# Patient Record
Sex: Female | Born: 1950 | Race: White | Hispanic: No | Smoking: Current some day smoker
Health system: Southern US, Community
[De-identification: ages and names within clinical notes are randomized; demographics above are authoritative.]

## PROBLEM LIST (undated history)

## (undated) DIAGNOSIS — D509 Iron deficiency anemia, unspecified: Secondary | ICD-10-CM

## (undated) DIAGNOSIS — K589 Irritable bowel syndrome without diarrhea: Secondary | ICD-10-CM

## (undated) DIAGNOSIS — J45909 Unspecified asthma, uncomplicated: Secondary | ICD-10-CM

## (undated) DIAGNOSIS — I10 Essential (primary) hypertension: Secondary | ICD-10-CM

## (undated) DIAGNOSIS — J449 Chronic obstructive pulmonary disease, unspecified: Secondary | ICD-10-CM

## (undated) DIAGNOSIS — C801 Malignant (primary) neoplasm, unspecified: Secondary | ICD-10-CM

## (undated) DIAGNOSIS — F419 Anxiety disorder, unspecified: Secondary | ICD-10-CM

## (undated) HISTORY — PX: ABDOMINAL HYSTERECTOMY: SHX81

## (undated) HISTORY — PX: HERNIA REPAIR: SHX51

## (undated) HISTORY — DX: Iron deficiency anemia, unspecified: D50.9

## (undated) HISTORY — DX: Irritable bowel syndrome, unspecified: K58.9

## (undated) HISTORY — PX: TONSILLECTOMY: SUR1361

## (undated) HISTORY — PX: BREAST SURGERY: SHX581

---

## 2001-06-20 ENCOUNTER — Other Ambulatory Visit: Admission: RE | Admit: 2001-06-20 | Discharge: 2001-06-20 | Payer: Self-pay | Admitting: Dermatology

## 2002-08-06 ENCOUNTER — Ambulatory Visit (HOSPITAL_COMMUNITY): Admission: RE | Admit: 2002-08-06 | Discharge: 2002-08-06 | Payer: Self-pay | Admitting: Internal Medicine

## 2004-07-13 ENCOUNTER — Ambulatory Visit: Payer: Self-pay | Admitting: Internal Medicine

## 2004-07-15 ENCOUNTER — Ambulatory Visit (HOSPITAL_COMMUNITY): Admission: RE | Admit: 2004-07-15 | Discharge: 2004-07-15 | Payer: Self-pay | Admitting: Internal Medicine

## 2004-07-15 ENCOUNTER — Ambulatory Visit: Payer: Self-pay | Admitting: Internal Medicine

## 2004-07-21 ENCOUNTER — Ambulatory Visit (HOSPITAL_COMMUNITY): Admission: RE | Admit: 2004-07-21 | Discharge: 2004-07-21 | Payer: Self-pay | Admitting: Internal Medicine

## 2004-07-26 ENCOUNTER — Encounter (HOSPITAL_COMMUNITY): Admission: RE | Admit: 2004-07-26 | Discharge: 2004-08-25 | Payer: Self-pay | Admitting: Internal Medicine

## 2004-08-23 ENCOUNTER — Ambulatory Visit: Payer: Self-pay | Admitting: Internal Medicine

## 2004-12-05 ENCOUNTER — Other Ambulatory Visit: Admission: RE | Admit: 2004-12-05 | Discharge: 2004-12-05 | Payer: Self-pay | Admitting: Dermatology

## 2005-01-05 ENCOUNTER — Other Ambulatory Visit: Admission: RE | Admit: 2005-01-05 | Discharge: 2005-01-05 | Payer: Self-pay | Admitting: Dermatology

## 2005-12-13 ENCOUNTER — Ambulatory Visit (HOSPITAL_COMMUNITY): Admission: RE | Admit: 2005-12-13 | Discharge: 2005-12-13 | Payer: Self-pay | Admitting: *Deleted

## 2005-12-25 ENCOUNTER — Ambulatory Visit: Payer: Self-pay | Admitting: Internal Medicine

## 2006-01-01 ENCOUNTER — Encounter (INDEPENDENT_AMBULATORY_CARE_PROVIDER_SITE_OTHER): Payer: Self-pay | Admitting: Specialist

## 2006-01-01 ENCOUNTER — Ambulatory Visit (HOSPITAL_COMMUNITY): Admission: RE | Admit: 2006-01-01 | Discharge: 2006-01-01 | Payer: Self-pay | Admitting: Internal Medicine

## 2006-01-01 ENCOUNTER — Ambulatory Visit: Payer: Self-pay | Admitting: Internal Medicine

## 2006-01-03 ENCOUNTER — Ambulatory Visit (HOSPITAL_COMMUNITY): Admission: RE | Admit: 2006-01-03 | Discharge: 2006-01-03 | Payer: Self-pay | Admitting: Internal Medicine

## 2006-02-14 ENCOUNTER — Ambulatory Visit: Payer: Self-pay | Admitting: Internal Medicine

## 2006-02-27 HISTORY — PX: HERNIA REPAIR: SHX51

## 2007-01-23 ENCOUNTER — Ambulatory Visit (HOSPITAL_COMMUNITY): Admission: RE | Admit: 2007-01-23 | Discharge: 2007-01-23 | Payer: Self-pay | Admitting: General Surgery

## 2010-07-12 NOTE — H&P (Signed)
[name], [name]             ACCOUNT NO.:  [id number]   MEDICAL RECORD NO.:  [id number]          PATIENT TYPE:  AMB   LOCATION:  DAY                           FACILITY:  [facility]   PHYSICIAN:  Dalia Heading, M.D.  DATE OF BIRTH:  July 28, 1950   DATE OF ADMISSION:  DATE OF DISCHARGE:  LH                              HISTORY & PHYSICAL   CHIEF COMPLAINT:  Abdominal pain, incisional hernia.   HISTORY OF PRESENT ILLNESS:  The patient is a 60 year old white female  who was referred for evaluation and treatment of abdominal pain of  unknown etiology.  It has been present for many years.  No etiology has  been found despite extensive workup.  No fevers, chills, nausea, or  vomiting have been noted.  Occasional bowel urgency is noted.  A  colonoscopy done in the recent past revealed a tortuous sigmoid colon  but was, otherwise, unremarkable.   PAST MEDICAL HISTORY INCLUDES:  Intrinsic allergy/asthma.   PAST SURGICAL HISTORY:  1. Laparoscopy.  2. Hysterectomy.   CURRENT MEDICATIONS:  Prednisone Dosepak which she just finished today,  hyoscyamine, alprazolam, Nexium, albuterol p.r.n.   ALLERGIES:  No known drug allergies.   REVIEW OF SYSTEMS:  The patient does smoke tobacco daily.  She denies  any significant alcohol use.  She denies any other cardiopulmonary  difficulties or bleeding disorders.   PHYSICAL EXAMINATION:  The patient is a well-developed, well-nourished,  white female in no acute distress.  LUNGS:  Clear to auscultation with equal breath sounds bilaterally.  HEART EXAMINATION:  Reveals a regular rate and rhythm without S3, S4,  murmurs.  ABDOMEN:  Is soft and nondistended.  She is tender in the umbilicus with  a reducible hernia present.  A surgical scar is present in the  umbilicus.  This reproduces the pain on palpation.  No  hepatosplenomegaly or masses are noted.   IMPRESSION:  Incisional hernia.  I think that she is getting omentum  stuck in this umbilical  hernia which is causing her nonspecific  abdominal pain.   PLAN:  The patient is scheduled for an incisional herniorrhaphy on  01/23/2007.  The risks and benefits of the procedure including bleeding,  infection, and the recurrence of pain were fully explained to the  patient, gave informed consent.  Should this not work, an exploratory  laparotomy may be warranted.  I would like to try this first and see if  this helps relieve her pain.      Dalia Heading, M.D.  Electronically Signed     MAJ/MEDQ  D:  01/17/2007  T:  01/18/2007  Job:  [id number]   cc:   Samuel Jester  Fax: [phone]

## 2010-07-12 NOTE — Op Note (Signed)
[name], [name]             ACCOUNT NO.:  [id number]   MEDICAL RECORD NO.:  [id number]          PATIENT TYPE:  AMB   LOCATION:  DAY                           FACILITY:  [facility]   PHYSICIAN:  Dalia Heading, M.D.  DATE OF BIRTH:  June 05, 1950   DATE OF PROCEDURE:  01/23/2007  DATE OF DISCHARGE:                               OPERATIVE REPORT   AGE:  60 years old.   PREOPERATIVE DIAGNOSIS:  Incisional hernia.   POSTOPERATIVE DIAGNOSIS:  Incisional hernia.   PROCEDURE:  Incisional herniorrhaphy with mesh.   SURGEON:  Dr. Franky Macho.   ANESTHESIA:  General.   INDICATIONS:  The patient is a 60 year old, white female who presents  with an incisional hernia at the umbilicus. The risks and benefits of  the procedure including bleeding, infection, and recurrence of the  hernia were fully explained to the patient, who gave informed consent.   DESCRIPTION OF PROCEDURE:  The patient was placed in the supine  position.  After induction of general endotracheal anesthesia, the  abdomen was prepped and draped using the usual sterile technique with  Betadine.  Surgical site confirmation was performed.   An infraumbilical incision was made down to the fascia.  The umbilicus  was freed away from the underlying fascia.  A small hernia defect was  identified.  A small piece of omentum was in this area and this was  excised without difficulty.  The rest of the hernia contents were  reduced.  A small size Ventralex patch was then placed within the  abdominal cavity and secured to the fascia using 2-0 Prolene interrupted  sutures.  The base of the umbilicus was secured back to the fascia using  a 3-0 Vicryl interrupted suture.  The subcutaneous layer was  reapproximated using a 3-0 Vicryl interrupted suture.  The skin was  closed using staples.  Betadine ointment and dry sterile dressings were  applied.   All tape and needle counts were correct at the end of the procedure.  The patient was  extubated in the operating room and went back to the  recovery room awake in stable condition.  Complications none.  Specimen none.  Blood loss minimal.      Dalia Heading, M.D.  Electronically Signed     MAJ/MEDQ  D:  01/23/2007  T:  01/23/2007  Job:  [id number]   cc:   Samuel Jester  Fax: [phone]

## 2010-07-15 NOTE — Op Note (Signed)
[name], [name]             ACCOUNT NO.:  [id number]   MEDICAL RECORD NO.:  [id number]          PATIENT TYPE:  AMB   LOCATION:  DAY                           FACILITY:  [facility]   PHYSICIAN:  Lionel December, M.D.    DATE OF BIRTH:  1950/12/04   DATE OF PROCEDURE:  07/15/2004  DATE OF DISCHARGE:                                 OPERATIVE REPORT   PROCEDURE:  Esophagogastroduodenoscopy.   [name] is a 60 year old Caucasian female with a three-month history of nausea,  abdominal fullness, frequent belching as well as early satiety and  heartburn.  She has not felt better with double-dose PPI as well as an  antispasmodic.  She had an upper GI with small bowel follow-through recently  which revealed residual food debris in her stomach and raised the  possibility of partial gastric outlet obstruction.  She is therefore  undergoing this study.   The procedural risks were reviewed with the patient, and informed consent  was obtained.   PREMEDICATION:  Cetacaine spray for oropharyngeal topical anesthesia,  Demerol 50 mg IV, Versed 6 mg IV.   FINDINGS:  Procedure performed in endoscopy suite.  The patient's vital  signs and O2 sats were monitored during the procedure and remained stable.  The patient was placed in the left lateral decubitus position.  The Olympus  videoscope was passed through the oropharynx without any difficulty into the  esophagus.   ESOPHAGUS:  The esophageal mucosa was normal.  Squamocolumnar junction was  at 38 cm, and hiatus was at 40.  The GE junction was wavy.   STOMACH:  It had a lot of bile in it, but there was no food debris.  It  distended very well with insufflation.  Folds in the proximal stomach were  normal.  Examination of the mucosa, body, antrum, pyloric channel, as well  as angularis, fundus, and cardia were normal.   DUODENUM:  Bulbar mucosa was normal.  The scope was passed in the second and  third part of the duodenum.  Mucosa and folds are  normal.   The endoscope was withdrawn.  The patient tolerated the procedure well.   FINAL DIAGNOSIS:  Small sliding hiatal hernia, otherwise normal  esophagogastroduodenoscopy. Wonder if she had an element of gastroparesis  secondary to antispasmodic therapy.   RECOMMENDATIONS:  She will continue antireflux measures and AcipHex 20 mg  p.o. b.i.d.  I would ask her to use dicyclomine on a p.r.n. basis only.  Will proceed with upper abdominal ultrasound to see her gallbladder, bile  duct, and pancreas.       NR/MEDQ  D:  07/15/2004  T:  07/15/2004  Job:  [id number]   cc:   Leo [address]  [place]  [place] [address]  Fax: [phone]

## 2010-07-15 NOTE — Op Note (Signed)
NAMEMARYJEAN, CORPENING             ACCOUNT NO.:  [id number]   MEDICAL RECORD NO.:  [id number]          PATIENT TYPE:  AMB   LOCATION:  DAY                           FACILITY:  [facility]   PHYSICIAN:  R. Roetta Sessions, M.D. DATE OF BIRTH:  November 20, 1950   DATE OF PROCEDURE:  DATE OF DISCHARGE:                                 OPERATIVE REPORT   PROCEDURE:  Diagnostic colonoscopy.   INDICATIONS FOR PROCEDURE:  A 60 year old lady with left sided abdominal  pain for at least three years duration.  History of colonic adenoma.  Prior  attempted colonoscopies incomplete.  Last barium enema revealed no  significant abnormalities 2004.  Colonoscopy is now being done.  This  procedure has been discussed with the patient at length.  The potential  risks, benefits and alternatives have been reviewed.  Questions answered.  Please see documentation in medical record.   PROCEDURE NOTE:  O2 saturation, blood pressure, pulse and respirations were  monitored throughout the entire procedure.   CONSCIOUS SEDATION:  Versed 6 mg IV and Demerol 125 mg IV in divided doses.  Phenergan 25 mg __________ IV push prior to the procedure.   INSTRUMENT:  Olympus video chip pediatric scope.   FINDINGS:  Digital rectal examination revealed no abnormalities.   ENDOSCOPIC FINDINGS:  Prep was adequate.   RECTUM:  Examination of the rectal mucosa including retroflex view of the  anal verge revealed no abnormalities aside from minimal internal  hemorrhoids.   COLON:  The colonic mucosa was surveyed from the rectosigmoid junction  through the left, transverse and right colon, to appendiceal orifice,  ileocecal valve and cecum.  These structures were well seen and photographed  for the record.  From this level, the scope was slowly withdrawn.  All  previously mentioned mucosal surfaces were again seen.  The colonic mucosa  appeared normal.  The patient had some of her left-sided abdominal pain as I  advanced through the  left colon although I used some external abdominal  pressure.  I moved the scope in a nice 1-to-1 fashion and there was really  no looping, but she was tender as I negotiated the left colon coming and  going.  The patient was returned to the recovery room in stable condition.   IMPRESSION:  1. Minimal internal hemorrhoids, otherwise normal rectum.  2. Normal colon.   RECOMMENDATIONS:  This lady ought to have a thoracic MRI to rule out nerve  compression on the left side contributing to her abdominal pain.  Hemorrhoid  literature provided to Ms. Senaida Ores.  A 10 day course of Anusol HC  suppositories 1 per rectum at bedtime.   ADDENDUM:  It is certainly possible the patient may have some adhesive  disease producing or at least contributing to some of her left sided  abdominal pain.      Jonathon Bellows, M.D.  Electronically Signed     RMR/MEDQ  D:  01/01/2006  T:  01/02/2006  Job:  [id number]   cc:   Samuel Jester  Fax: [phone]

## 2010-07-15 NOTE — Consult Note (Signed)
[name], [name]             ACCOUNT NO.:  [id number]   MEDICAL RECORD NO.:  [id number]          PATIENT TYPE:  AMB   LOCATION:                                FACILITY:  [facility]   PHYSICIAN:  R. Roetta Sessions, M.D. DATE OF BIRTH:  October 05, 1950   DATE OF CONSULTATION:  12/25/2005  DATE OF DISCHARGE:                                   CONSULTATION   CHIEF COMPLAINT:  Left-sided abdominal pain.   REASON FOR REFERRAL:  Left-sided abdominal pain.   She was here August 23, 2004, with similar symptoms.  In fact, she has had  left-sided abdominal pain for 3 years.  She noted that she is posturing in  the __________.  She has had the pain for 3 years.  She has intermittent  diarrhea 4 to 5 days out of the month, otherwise has 1 formed bowel movement  daily.  When she has diarrhea, she has hematochezia.  Bowel function is not  associated with abdominal pain.  She wakes up with it and goes to bed with  it.  It has been insidiously worsening over the past 3 or 4 weeks.  She has  seen Dr. Charm Barges, who obtained a CT scan on December 13, 2005.  This  demonstrated small soft tissue nodule with __________ calcification, left  upper quadrant lateral to stomach, 2-cm in size, outside the lumen of the GI  tract.  No prior CTs for comparison.  Otherwise, she is status post  hysterectomy.  No appendix identified.  Otherwise, everything looked just  fine.  She has not had associated fever or chills.  No urinary tract  symptoms.  She describes the pain as being mid abdomen to the left of the  umbilicus.  She has never seen a rash and denies every having shingles.  She  has been on hyoscyamine sublingually and Librax was added to her regimen  recently with no improvement in her symptoms whatsoever.  She has not had  fever or chills.  No odynophagia, dysphagia, or early satiety.  She was also  started on Reglan 10 mg a.c. and h.s., which she has been taking but does  not perceive any improvement in symptoms.   She tells me she presumes it was  given to her for her left-sided abdominal pain.  She was last seen here in  June 2006 for more or less similar symptoms, although at that time she was  having some mid abdomen to left-sided abdominal pain.  She previously had an  EGD because of an upper GI small bowel follow-through implying partial  gastric outlet obstruction.  HIDA scan demonstrated a gallbladder EF of 57%.  Abdominal ultrasound revealed borderline splenomegaly and diffuse bilateral  renal parenchymal atrophy.  She denies any known degenerative joint disease  in her spine.  Serum creatinine was recently found to be 1.  The CT scan was  done with IV contrast.   PAST MEDICAL HISTORY:  Significant for a colonic adenoma removed in 1999.  Attempted colonoscopy was unsuccessful due to tortuosity of the colon.  Air  contrast barium enema revealed a 4  mm defect in the descending colon, for  which a followup barium enema was recommended 2 years later.  It is notable  in 2004, a colonoscopy was attempted for left-sided abdominal pain,  intermittent nonbloody diarrhea, and history of colonic adenoma.  It was an  incomplete examination for the same reasons as it was previously.  She has  not had any change in her weight.  She really does not have any odynphagia,  dysphagia, or early satiety, reflux symptoms, nausea, or vomiting.  This  pain is positional pain and if she helps support her body upright with her  arms, it tends to lessen it somewhat, and it is worse with changing of the  patient's position.   PAST MEDICAL HISTORY:  Significant for:  1. Recurrent urinary tract infections.  2. Status post hysterectomy.  3. Presumably appendectomy.  4. Right breast biopsy.  5. Tonsillectomy.   This lady's other past medical problems include bronchial asthma, history of  melanoma for which she is followed by Dr. Margo Aye, complete hysterectomy,  tonsillectomy.   CURRENT MEDICATIONS:  1. Premarin 0.3  mg daily.  2. Xanax 0.5 mg daily.  3. Xopenex 45 mcg p.r.n.  4. Levsin sublingual.  5. Singulair.  6. Generic Librax.  7. Reglan 10 mg a.c. and h.s.   ALLERGIES:  CODEINE/VICODIN.   FAMILY HISTORY:  No history of colorectal neoplasia or other chronic  gastrointestinal illness.  Mother died at age 22 of lung cancer.  Father  died at age 62 of unknown cause.  Ms. Declercq is currently divorced.  She  works for a [employer].  She continues to smoke.  She rarely uses alcohol.  She has 2 grown sons.   REVIEW OF SYMPTOMS:  No chest pain.  No dyspnea on exertion.  No change in  weight.  Otherwise, as in the history of present illness.   PHYSICAL EXAMINATION:  Reveals a pleasant, anxious, 60 year old lady who  definitely is posturing to help with abdominal pain.  Height 5 feet 1 inch.  Temp 98.2.  BP 132/70.  Pulse 78.  SKIN:  Warm and dry.  There is no jaundice.  No cutaneous stigmata of  chronic liver disease.  HEENT:  No sclerae icterus.  Conjunctivae are pink.  CHEST:  Lungs are clear to auscultation.  CARDIAC:  Regular rate and rhythm without murmur, gallop, or rub.  BREASTS:  Exam is deferred.  ABDOMEN:  Nondistended.  Positive bowel sounds.  She is quite tender to  palpation just to the left of the umbilicus.  At this level over toward the  flank, a little upper left lower quadrant tenderness, she has tenderness  even to light palpation.  She guards with deeper palpation.  Tenderness is  localized to this area.  I do not appreciate a mass.  The overlying skin is  normal.  There is no hepatosplenomegaly.  EXTREMITIES:  No edema.  No CVA tenderness of percussion over the spine.  Digital rectal examination reveals no mass in the rectal vault.  No stool in  the rectal vault.  Mucus is Hemoccult negative.   IMPRESSION:  Ms. Orian Amberg. Jepsen is a pleasant 60 year old lady with more  or less 3-year history of left mid abdominal pain, which to me certainly seems to be more  abdominal wall or cutaneous in origin.  Neuropathic origin  is not ruled out.  The symptoms would be somewhat reminiscent for post-  herpetic neuralgia, although there is no history of her ever having  shingles.   She does have some irritable bowel symptoms, however, but they are not  really connected to her abdominal pain as I see it.   Nerve root compression from her lower thoracic spine is certainly in the  differential at this time.  She has a history for colonic adenoma and has  really never had her entire lower gastrointestinal tract evaluated directly.  She does have intermittent hematochezia.  She needs to have a colonoscopy.  It is understood that technically this approach has been difficult  previously.  Ms. Maj relates to me that she becomes nauseated during  and after conscious sedation for such procedures.   RECOMMENDATIONS:  1. We will go ahead and try her on some 5% Lidoderm patches, no more than      3 patches over a 12-hour period daily.  I have given her a prescription      for 50.  2. We will set her up for colonoscopy.  I will give her Zofran      prophylactically for the procedure.  I told her that we are going in to      the colonoscopy knowing that technically her colon is difficult to do      and we may not be able to get all the way around to the cecum and may      be left with obtaining an air contrast barium enema to complement the      colonoscopy.  Her questions were answered.  She is agreeable.  Would      also like to review the CAT scan from [facility]; will need to      get over to [facility] via CD-ROM.  I will make further recommendations      in the very near future.   I would like to thank Dr. Samuel Jester for allowing me to see this nice  lady today.      Jonathon Bellows, M.D.  Electronically Signed     RMR/MEDQ  D:  12/25/2005  T:  12/25/2005  Job:  [id number]   cc:   Samuel Jester  Fax: [phone]

## 2010-07-15 NOTE — Op Note (Signed)
NAME:  [name], [name]                       ACCOUNT NO.:  [id number]   MEDICAL RECORD NO.:  [id number]                   PATIENT TYPE:  AMB   LOCATION:  DAY                                  FACILITY:  [facility]   PHYSICIAN:  Lionel December, M.D.                 DATE OF BIRTH:  Dec 22, 1950   DATE OF PROCEDURE:  08/06/2002  DATE OF DISCHARGE:                                 OPERATIVE REPORT   PROCEDURE:  Attempted colonoscopy, flexible sigmoidoscopy.   INDICATIONS FOR PROCEDURE:  [name] is a 60 year old Caucasian female with  chronic/intermittent lower abdominal pain and nonbloody diarrhea. She is  suspected to have IBS but given her age decided to pursue with a  colonoscopy. The procedure is reviewed with the patient and informed consent  was obtained.   PREOP MEDICATIONS:  Demerol 50 mg IV, Versed 8 mg IV in divided dose.   INSTRUMENT:  Olympus video system.   FINDINGS:  Procedure performed in endoscopy suite. The patient's vital signs  and O2 saturations were monitored during the procedure and remained stable.  The patient was placed in the left lateral decubitus position, rectal  examination performed. No abnormality noted on external or digital exam. The  scope was placed in the rectum and advanced under direct vision to the  sigmoid colon. The sigmoid colon was tortuous and the patient complained of  pain every time the scope was moved even to a slight degree despite fairly  good dose of conscious sedation. Slowly and carefully the scope was passed  into the junction of the sigmoid and descending colon. Every time I tried to  pass the scope further, she was complaining of more pain. Therefore exam  could not be completed. The mucosa of the distal descending colon and  sigmoid colon revealed fine pigmentation consistent with melanosis coli.  There were no mucosal changes suggesting colitis and there were no  diverticula. The rectal mucosa similarly was normal. The scope was  retroflexed to examine the anorectal junction which was unremarkable. The  endoscope was withdrawn. The patient tolerated the procedure fairly well.   FINAL DIAGNOSES:  Incomplete exam limited to flexible sigmoidoscopy. The  mucosa in the segment that was examined was normal. She had some mild  changes of melanosis coli.   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   RECOMMENDATIONS:  1. I would ask her to stop her Keflex unless she is taking for non GI     reasons.  2. Dicyclomine 10 mg before each meal. If she does not notice any     improvement she can increase her dose to 20 mg bid after one week.     Prescription given for 10 mg tablets, 100 pills with one refill.  3.     Citrucel one tablespoonful daily.  4. Will plan to see her in the office in 3-4 weeks from now. If she  does not     improve significantly will consider further studies.                                               Lionel December, M.D.    NR/MEDQ  D:  08/06/2002  T:  08/06/2002  Job:  [id number]   cc:   Leo [address]  [place]  [place] [address]  Fax: [phone]

## 2010-07-15 NOTE — H&P (Signed)
NAME:  CECE, MILHOUSE             ACCOUNT NO.:  [id number]   MEDICAL RECORD NO.:  [id number]            PATIENT TYPE:   LOCATION:                                 FACILITY:   PHYSICIAN:  Lionel December, M.D.    DATE OF BIRTH:  [birth year]   DATE OF ADMISSION:  DATE OF DISCHARGE:  LH                                HISTORY & PHYSICAL   CHIEF COMPLAINT:  Possible partial gastric outlet obstruction, three month  history of chronic nausea.   HISTORY OF PRESENT ILLNESS:  Ms. Farrey is a 60 year old Caucasian  female, patient of Dr. Silvana Newness. She has been seen by Dr. Karilyn Cota in the past  and felt to have irritable bowel syndrome. She is on antispasmodic therapy  including Bentyl 10 mg q.i.d. and Levbid b.i.d. She also has history of  chronic adenoma on her colonoscopy in 1999 which was incomplete followed by  barium enema which was normal. She notes that she continues to have  diarrhea. About three months ago, she began to have daily nausea as well as  some abdominal bloating and fullness. She also had anorexia and frequent  belching. She complains of heartburn and indigestion and early satiety. She  was taking Tums with no relief. She was then started on Aciphex 20 mg b.i.d.  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 Small bowel appeared normal on films. She denies any new  medications. She reports a 5-pound weight loss in the last three months. She  also complains of some mild abdominal constant pain which she describes as a  pressure.   PAST MEDICAL HISTORY:  1.  Chronic bronchial asthma.  2.  Melanoma which she is followed by Dr. Margo Aye.  3.  Complete hysterectomy.  4.  Benign breast cyst.  5.  Tonsillectomy.   CURRENT MEDICATIONS:  1.  Bentyl 10 mg q.i.d.  2.  Combivent b.i.d.  3.  Premarin 0.3 mg daily.  4.  Xanax 0.5 mg half tablet to one tablet b.i.d.  5.   Levbid 0.375 mg b.i.d.  6.  FiberChoice daily.  7.  Aciphex 20 mg b.i.d.  8.  Tylenol p.r.n.   ALLERGIES:  VICODIN which caused nausea and vomiting.   FAMILY HISTORY:  There is no known family history of colorectal carcinoma,  liver or chronic GI problem. Both parents are deceased with mother at age 41  secondary to lung carcinoma, father at age 13 due to unknown etiology.   SOCIAL HISTORY:  Ms. Haseley is currently divorced. She lives with her  brother. She has two grown healthy sons. She is employed full time with  [employer]. She reports a 10-year tobacco use history, smoking about a  half a pack a day. Rare alcohol use. Denies any drug use.   REVIEW OF SYSTEMS:  CONSTITUTIONAL:  Denies any fever or chills. See HPI.  CARDIOVASCULAR:  Denies any chest pain or palpitations. PULMONARY:  She does  have occasional shortness of breath and wheezing with history of bronchial  asthma. No recent shortness of breath, dyspnea, or hemoptysis. NEUROLOGICAL:  She does have occasional headaches. Denies any dizziness or vertigo or  diplopia. PSYCHOSOCIAL:  She does note being under a significant amount of  stress with her two children. Denies any depression or anxiety.   PHYSICAL EXAMINATION:  VITAL SIGNS:  Weight 150 pounds, height 61-1/2  inches, temperature 98.1, blood pressure 118/70, pulse 72.  GENERAL:  Ms. Illingworth is a 60 year old Caucasian female who is alert,  oriented, pleasant, cooperative, in no acute distress.  HEENT:  Sclerae are clear and nonicteric. Conjunctivae are pink. Oropharynx  pink and moist without any lesions.  NECK:  Supple without masses or thyromegaly.  CHEST:  Heart regular rate and rhythm, normal S1 and S2 without any murmurs,  clicks, rubs, or gallops.  LUNGS:  With bilateral rhonchi, no acute distress.  ABDOMEN:  Flat with positive bowel sounds x4. No bruits auscultated. She  does have mild tenderness to left lower quadrant just below the umbilicus on   the deep palpation. There is no rebound, tenderness, or guarding. No  hepatosplenomegaly or mass.  EXTREMITIES:  She has trace lower extremity edema bilaterally.  SKIN:  She has small macule to her nose and right upper extremity which she  is going to have evaluated by Dr. Margo Aye.   IMPRESSION:  Ms. Fowle is a 60 year old Caucasian female with history  of irritable bowel syndrome who presents with a three-month history of  abdominal bloating, fullness, chronic daily nausea, anorexia, and early  satiety. 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  She needs to undergo EGD to further assess her upper GI tract, right upper  lobe out peptic ulcer disease or malignancy. It is possible that she may  have gastroparesis, and she is on a significant amount of antispasmodics for  her IBS which could be contributing to this as well. She notes her heartburn  and indigestion are somewhat better on b.i.d. Aciphex.   RECOMMENDATIONS:  1.  Will schedule EGD with Dr. Karilyn Cota in the near future. I have discussed      this procedure including risks and benefits which include but are not      limited to bleeding, infection, perforation, and drug reaction, and she      agrees to the plan and consent will be obtained.  2.  She is going to drop the Levbid and use Bentyl 10 mg q.i.d. She can      increase this to 20 mg q.i.d. if she is still      having diarrhea.  3.  She should continue Aciphex 20 mg b.i.d.  4.  Further recommendations pending EGD, and if she continues to have      problems, she may need CT scan.      KC/MEDQ  D:  07/13/2004  T:  07/13/2004  Job:  [id number]   cc:   [address]  [place]  [place] [address]  Fax: [phone]

## 2010-11-28 ENCOUNTER — Encounter: Payer: Self-pay | Admitting: Internal Medicine

## 2010-12-06 LAB — BASIC METABOLIC PANEL
BUN: 10
CO2: 30
GFR calc non Af Amer: >60
Glucose, Bld: 92
Potassium: 4.4

## 2010-12-06 LAB — CBC
HCT: 46
MCHC: 33.9
Platelets: 158
RDW: 13.8

## 2013-05-19 ENCOUNTER — Inpatient Hospital Stay (HOSPITAL_COMMUNITY)
Admission: EM | Admit: 2013-05-19 | Discharge: 2013-05-20 | DRG: 191 | Disposition: A | Discharge status: Home / Self Care | Payer: BC Managed Care – PPO | Attending: Internal Medicine | Admitting: Internal Medicine

## 2013-05-19 ENCOUNTER — Emergency Department (HOSPITAL_COMMUNITY): Payer: BC Managed Care – PPO

## 2013-05-19 ENCOUNTER — Encounter (HOSPITAL_COMMUNITY): Payer: Self-pay | Admitting: Emergency Medicine

## 2013-05-19 DIAGNOSIS — F17200 Nicotine dependence, unspecified, uncomplicated: Secondary | ICD-10-CM | POA: Diagnosis present

## 2013-05-19 DIAGNOSIS — J45901 Unspecified asthma with (acute) exacerbation: Principal | ICD-10-CM

## 2013-05-19 DIAGNOSIS — F411 Generalized anxiety disorder: Secondary | ICD-10-CM | POA: Diagnosis present

## 2013-05-19 DIAGNOSIS — I10 Essential (primary) hypertension: Secondary | ICD-10-CM

## 2013-05-19 DIAGNOSIS — J209 Acute bronchitis, unspecified: Secondary | ICD-10-CM | POA: Diagnosis present

## 2013-05-19 DIAGNOSIS — J441 Chronic obstructive pulmonary disease with (acute) exacerbation: Secondary | ICD-10-CM | POA: Diagnosis not present

## 2013-05-19 DIAGNOSIS — D72829 Elevated white blood cell count, unspecified: Secondary | ICD-10-CM | POA: Diagnosis present

## 2013-05-19 DIAGNOSIS — J9601 Acute respiratory failure with hypoxia: Secondary | ICD-10-CM | POA: Diagnosis present

## 2013-05-19 DIAGNOSIS — J9801 Acute bronchospasm: Secondary | ICD-10-CM

## 2013-05-19 DIAGNOSIS — R0602 Shortness of breath: Secondary | ICD-10-CM | POA: Diagnosis not present

## 2013-05-19 DIAGNOSIS — J440 Chronic obstructive pulmonary disease with acute lower respiratory infection: Secondary | ICD-10-CM | POA: Diagnosis present

## 2013-05-19 DIAGNOSIS — R6510 Systemic inflammatory response syndrome (SIRS) of non-infectious origin without acute organ dysfunction: Secondary | ICD-10-CM

## 2013-05-19 DIAGNOSIS — J9600 Acute respiratory failure, unspecified whether with hypoxia or hypercapnia: Secondary | ICD-10-CM

## 2013-05-19 HISTORY — DX: Unspecified asthma, uncomplicated: J45.909

## 2013-05-19 HISTORY — DX: Essential (primary) hypertension: I10

## 2013-05-19 HISTORY — DX: Anxiety disorder, unspecified: F41.9

## 2013-05-19 LAB — CBC WITH DIFFERENTIAL/PLATELET
Basophils Absolute: 0 10*3/uL (ref 0.0–0.1)
Basophils Relative: 0 % (ref 0–1)
Eosinophils Absolute: 0 10*3/uL (ref 0.0–0.7)
Eosinophils Relative: 0 % (ref 0–5)
HCT: 45.9 % (ref 36.0–46.0)
Hemoglobin: 16 g/dL — ABNORMAL HIGH (ref 12.0–15.0)
Lymphocytes Relative: 11 % — ABNORMAL LOW (ref 12–46)
Lymphs Abs: 1.7 10*3/uL (ref 0.7–4.0)
MCH: 30.1 pg (ref 26.0–34.0)
MCHC: 34.9 g/dL (ref 30.0–36.0)
MCV: 86.4 fL (ref 78.0–100.0)
Monocytes Absolute: 0.9 10*3/uL (ref 0.1–1.0)
Monocytes Relative: 5 % (ref 3–12)
Neutro Abs: 13.5 10*3/uL — ABNORMAL HIGH (ref 1.7–7.7)
Neutrophils Relative %: 84 % — ABNORMAL HIGH (ref 43–77)
Platelets: 166 10*3/uL (ref 150–400)
RBC: 5.31 MIL/uL — ABNORMAL HIGH (ref 3.87–5.11)
RDW: 12.6 % (ref 11.5–15.5)
WBC: 16.1 10*3/uL — ABNORMAL HIGH (ref 4.0–10.5)

## 2013-05-19 LAB — INFLUENZA PANEL BY PCR (TYPE A & B)
H1N1 flu by pcr: NOT DETECTED
Influenza A By PCR: NEGATIVE
Influenza B By PCR: NEGATIVE

## 2013-05-19 LAB — BASIC METABOLIC PANEL
BUN: 12 mg/dL (ref 6–23)
CO2: 28 mEq/L (ref 19–32)
Calcium: 9.3 mg/dL (ref 8.4–10.5)
Chloride: 96 mEq/L (ref 96–112)
Creatinine, Ser: 0.55 mg/dL (ref 0.50–1.10)
GFR calc Af Amer: >90 mL/min (ref >90)
GFR calc non Af Amer: >90 mL/min (ref >90)
Glucose, Bld: 144 mg/dL — ABNORMAL HIGH (ref 70–99)
Potassium: 3.6 mEq/L — ABNORMAL LOW (ref 3.7–5.3)
Sodium: 136 mEq/L — ABNORMAL LOW (ref 137–147)

## 2013-05-19 MED ORDER — LISINOPRIL-HYDROCHLOROTHIAZIDE 10-12.5 MG PO TABS: 1.0000 | ORAL_TABLET | Freq: Every day | ORAL | Status: DC

## 2013-05-19 MED ORDER — SODIUM CHLORIDE 0.9 % IV SOLN: 250.0000 mL | INTRAVENOUS | Status: DC | PRN

## 2013-05-19 MED ORDER — LEVALBUTEROL HCL 0.63 MG/3ML IN NEBU
0.6300 mg | INHALATION_SOLUTION | Freq: Four times a day (QID) | RESPIRATORY_TRACT | Status: DC
Start: 2013-05-19 — End: 2013-05-20
  Administered 2013-05-19 – 2013-05-20 (×2): 0.63 mg via RESPIRATORY_TRACT
  Filled 2013-05-19 (×2): qty 3

## 2013-05-19 MED ORDER — LEVOFLOXACIN IN D5W 500 MG/100ML IV SOLN
500.0000 mg | Freq: Once | INTRAVENOUS | Status: AC
  Administered 2013-05-19: 500 mg via INTRAVENOUS
  Filled 2013-05-19: qty 100

## 2013-05-19 MED ORDER — HYDROCHLOROTHIAZIDE 12.5 MG PO CAPS
12.5000 mg | ORAL_CAPSULE | Freq: Every day | ORAL | Status: DC
  Administered 2013-05-20: 12.5 mg via ORAL
  Filled 2013-05-19: qty 1

## 2013-05-19 MED ORDER — ONDANSETRON HCL 4 MG/2ML IJ SOLN
4.0000 mg | Freq: Once | INTRAMUSCULAR | Status: DC
  Filled 2013-05-19: qty 2

## 2013-05-19 MED ORDER — DM-GUAIFENESIN ER 30-600 MG PO TB12
1.0000 | ORAL_TABLET | Freq: Two times a day (BID) | ORAL | Status: DC
  Administered 2013-05-19 – 2013-05-20 (×2): 1 via ORAL
  Filled 2013-05-19 (×2): qty 1

## 2013-05-19 MED ORDER — PREDNISONE 50 MG PO TABS
60.0000 mg | ORAL_TABLET | Freq: Once | ORAL | Status: AC
  Administered 2013-05-19: 60 mg via ORAL
  Filled 2013-05-19 (×2): qty 1

## 2013-05-19 MED ORDER — ALBUTEROL SULFATE (2.5 MG/3ML) 0.083% IN NEBU: 2.5000 mg | INHALATION_SOLUTION | RESPIRATORY_TRACT | Status: DC | PRN

## 2013-05-19 MED ORDER — IPRATROPIUM BROMIDE 0.02 % IN SOLN
0.5000 mg | Freq: Four times a day (QID) | RESPIRATORY_TRACT | Status: DC
  Administered 2013-05-19 – 2013-05-20 (×2): 0.5 mg via RESPIRATORY_TRACT
  Filled 2013-05-19 (×2): qty 2.5

## 2013-05-19 MED ORDER — LISINOPRIL 10 MG PO TABS
10.0000 mg | ORAL_TABLET | Freq: Every day | ORAL | Status: DC
  Administered 2013-05-20: 10 mg via ORAL
  Filled 2013-05-19: qty 1

## 2013-05-19 MED ORDER — ONDANSETRON HCL 4 MG/2ML IJ SOLN
4.0000 mg | Freq: Once | INTRAMUSCULAR | Status: AC
  Administered 2013-05-19: 4 mg via INTRAVENOUS

## 2013-05-19 MED ORDER — ALPRAZOLAM 0.5 MG PO TABS
0.5000 mg | ORAL_TABLET | Freq: Three times a day (TID) | ORAL | Status: DC
  Administered 2013-05-19 – 2013-05-20 (×2): 0.5 mg via ORAL
  Filled 2013-05-19 (×2): qty 1

## 2013-05-19 MED ORDER — IPRATROPIUM BROMIDE 0.02 % IN SOLN
0.5000 mg | Freq: Once | RESPIRATORY_TRACT | Status: DC
  Filled 2013-05-19: qty 2.5

## 2013-05-19 MED ORDER — ALBUTEROL (5 MG/ML) CONTINUOUS INHALATION SOLN
15.0000 mg/h | INHALATION_SOLUTION | RESPIRATORY_TRACT | Status: DC
  Administered 2013-05-19: 15 mg/h via RESPIRATORY_TRACT
  Filled 2013-05-19: qty 20

## 2013-05-19 MED ORDER — ENOXAPARIN SODIUM 40 MG/0.4ML ~~LOC~~ SOLN
40.0000 mg | SUBCUTANEOUS | Status: DC
  Administered 2013-05-19: 40 mg via SUBCUTANEOUS
  Filled 2013-05-19: qty 0.4

## 2013-05-19 MED ORDER — SODIUM CHLORIDE 0.9 % IJ SOLN: 3.0000 mL | INTRAMUSCULAR | Status: DC | PRN

## 2013-05-19 MED ORDER — METOPROLOL SUCCINATE ER 50 MG PO TB24
50.0000 mg | ORAL_TABLET | Freq: Every day | ORAL | Status: DC
  Administered 2013-05-20: 50 mg via ORAL
  Filled 2013-05-19: qty 1

## 2013-05-19 MED ORDER — SODIUM CHLORIDE 0.9 % IJ SOLN
3.0000 mL | Freq: Two times a day (BID) | INTRAMUSCULAR | Status: DC
  Administered 2013-05-19: 3 mL via INTRAVENOUS

## 2013-05-19 MED ORDER — ACETAMINOPHEN 500 MG PO TABS
1000.0000 mg | ORAL_TABLET | Freq: Once | ORAL | Status: AC
  Administered 2013-05-19: 1000 mg via ORAL
  Filled 2013-05-19: qty 2

## 2013-05-19 MED ORDER — SODIUM CHLORIDE 0.9 % IJ SOLN
3.0000 mL | Freq: Two times a day (BID) | INTRAMUSCULAR | Status: DC
Start: 2013-05-19 — End: 2013-05-20
  Administered 2013-05-19: 3 mL via INTRAVENOUS

## 2013-05-19 MED ORDER — METHYLPREDNISOLONE SODIUM SUCC 125 MG IJ SOLR
60.0000 mg | Freq: Four times a day (QID) | INTRAMUSCULAR | Status: DC
  Administered 2013-05-19 – 2013-05-20 (×3): 60 mg via INTRAVENOUS
  Filled 2013-05-19 (×3): qty 2

## 2013-05-19 MED ORDER — SODIUM CHLORIDE 0.9 % IV BOLUS (SEPSIS)
500.0000 mL | Freq: Once | INTRAVENOUS | Status: AC
  Administered 2013-05-19: 500 mL via INTRAVENOUS

## 2013-05-19 MED ORDER — OXYCODONE-ACETAMINOPHEN 5-325 MG PO TABS
1.0000 | ORAL_TABLET | Freq: Four times a day (QID) | ORAL | Status: DC | PRN
Start: 2013-05-19 — End: 2013-05-20

## 2013-05-19 NOTE — H&P (Signed)
PCP:   Octavio Graves, DO   Chief Complaint:  Cough and shortness of breath  HPI: 63 year old female who   has a past medical history of Asthma; Hypertension; and Anxiety. Today presented to the ED with chief complaint of cough with shortness of breath going on for past 2 days. Patient also had one episode of vomiting which she attributes to about of coughing. Patient has been coughing up yellow to brown-colored phlegm. She has a history of asthma but never was diagnosed with COPD. She denies history of coronary artery disease. She admits to having fever, no chest pain, no nausea or diarrhea. In the ED patient received nebulizer treatments, and has improved somewhat but O2 sats dropped to 85% after ambulating in the hallway.  Allergies:   Allergies  Allergen Reactions  . Codeine Nausea And Vomiting      Past Medical History  Diagnosis Date  . Asthma   . Hypertension   . Anxiety     Past Surgical History  Procedure Laterality Date  . Abdominal hysterectomy    . Breast surgery      left lumpectomy  . Hernia repair    . Tonsillectomy      Prior to Admission medications   Medication Sig Start Date End Date Taking? Authorizing Provider  albuterol (PROVENTIL HFA;VENTOLIN HFA) 108 (90 BASE) MCG/ACT inhaler Inhale 2 puffs into the lungs every 6 (six) hours as needed for wheezing or shortness of breath.   Yes Historical Provider, MD  albuterol-ipratropium (COMBIVENT) 18-103 MCG/ACT inhaler Inhale 1 puff into the lungs 4 (four) times daily.   Yes Historical Provider, MD  ALPRAZolam Duanne Moron) 0.5 MG tablet Take 0.5 mg by mouth 3 (three) times daily.   Yes Historical Provider, MD  ipratropium-albuterol (DUONEB) 0.5-2.5 (3) MG/3ML SOLN Take 3 mLs by nebulization every 6 (six) hours as needed (for shortness of breath/wheezing).   Yes Historical Provider, MD  lisinopril-hydrochlorothiazide (PRINZIDE,ZESTORETIC) 10-12.5 MG per tablet Take 1 tablet by mouth daily.   Yes Historical Provider, MD   metoprolol succinate (TOPROL-XL) 50 MG 24 hr tablet Take 50 mg by mouth daily. Take with or immediately following a meal.   Yes Historical Provider, MD  oxyCODONE-acetaminophen (PERCOCET/ROXICET) 5-325 MG per tablet Take 1 tablet by mouth every 6 (six) hours as needed for moderate pain or severe pain.   Yes Historical Provider, MD    Social History:  reports that she has been smoking Cigarettes.  She has been smoking about 1.00 pack per day. She does not have any smokeless tobacco history on file. She reports that she does not drink alcohol or use illicit drugs.    All the positives are listed in BOLD  Review of Systems:  HEENT: Headache, blurred vision, runny nose, sore throat Neck: Hypothyroidism, hyperthyroidism,,lymphadenopathy Chest : Shortness of breath, history of COPD, Asthma Heart : Chest pain, history of coronary arterey disease GI:  Nausea, vomiting, diarrhea, constipation, GERD GU: Dysuria, urgency, frequency of urination, hematuria Neuro: Stroke, seizures, syncope Psych: Depression, anxiety, hallucinations   Physical Exam: Blood pressure 113/64, pulse 136, temperature 99.8 F (37.7 C), temperature source Oral, resp. rate 18, height $RemoveBe'5\' 1"'ASMurCKhy$  (1.549 m), weight 56.7 kg (125 lb), SpO2 94.00%. Constitutional:   Patient is a well-developed and well-nourished female* in no acute distress and cooperative with exam. Head: Normocephalic and atraumatic Mouth: Mucus membranes moist Eyes: PERRL, EOMI, conjunctivae normal Neck: Supple, No Thyromegaly Cardiovascular: RRR, S1 normal, S2 normal Pulmonary/Chest: Bilateral rhonchi Abdominal: Soft. Non-tender, non-distended, bowel sounds are normal,  no masses, organomegaly, or guarding present.  Neurological: A&O x3, Strenght is normal and symmetric bilaterally, cranial nerve II-XII are grossly intact, no focal motor deficit, sensory intact to light touch bilaterally.  Extremities : No Cyanosis, Clubbing or Edema   Labs on Admission:   Results for orders placed during the hospital encounter of 05/19/13 (from the past 48 hour(s))  CBC WITH DIFFERENTIAL     Status: Abnormal   Collection Time    05/19/13  6:43 PM      Result Value Ref Range   WBC 16.1 (*) 4.0 - 10.5 K/uL   RBC 5.31 (*) 3.87 - 5.11 MIL/uL   Hemoglobin 16.0 (*) 12.0 - 15.0 g/dL   HCT 90.4  14.3 - 15.1 %   MCV 86.4  78.0 - 100.0 fL   MCH 30.1  26.0 - 34.0 pg   MCHC 34.9  30.0 - 36.0 g/dL   RDW 12.8  49.9 - 93.3 %   Platelets 166  150 - 400 K/uL   Neutrophils Relative % 84 (*) 43 - 77 %   Neutro Abs 13.5 (*) 1.7 - 7.7 K/uL   Lymphocytes Relative 11 (*) 12 - 46 %   Lymphs Abs 1.7  0.7 - 4.0 K/uL   Monocytes Relative 5  3 - 12 %   Monocytes Absolute 0.9  0.1 - 1.0 K/uL   Eosinophils Relative 0  0 - 5 %   Eosinophils Absolute 0.0  0.0 - 0.7 K/uL   Basophils Relative 0  0 - 1 %   Basophils Absolute 0.0  0.0 - 0.1 K/uL  BASIC METABOLIC PANEL     Status: Abnormal   Collection Time    05/19/13  6:43 PM      Result Value Ref Range   Sodium 136 (*) 137 - 147 mEq/L   Potassium 3.6 (*) 3.7 - 5.3 mEq/L   Chloride 96  96 - 112 mEq/L   CO2 28  19 - 32 mEq/L   Glucose, Bld 144 (*) 70 - 99 mg/dL   BUN 12  6 - 23 mg/dL   Creatinine, Ser 0.05  0.50 - 1.10 mg/dL   Calcium 9.3  8.4 - 65.4 mg/dL   GFR calc non Af Amer >90  >90 mL/min   GFR calc Af Amer >90  >90 mL/min   Comment: (NOTE)     The eGFR has been calculated using the CKD EPI equation.     This calculation has not been validated in all clinical situations.     eGFR's persistently <90 mL/min signify possible Chronic Kidney     Disease.    Radiological Exams on Admission: Dg Chest Portable 1 View  05/19/2013   CLINICAL DATA:  Cough, shortness of breath, hypertension  EXAM: PORTABLE CHEST - 1 VIEW  COMPARISON:  None.  FINDINGS: Cardiomediastinal silhouette is unremarkable. No acute infiltrate or pleural effusion. No pulmonary edema.  IMPRESSION: No active disease.   Electronically Signed   By: Natasha Mead M.D.   On: 05/19/2013 18:33    Assessment/Plan Principal Problem:   Acute bronchitis Active Problems:   Acute respiratory failure   HTN (hypertension)  Acute bronchitis versus COPD exacerbation Patient will be admitted, start Solu-Medrol 60 mg IV every 6 hours, Levaquin per pharmacy consultation, Mucinex 1 tablet by mouth twice a day, DuoNeb nebulizers every 6 hours. Chest x-ray shows no infiltrate. EKG shows sinus tachycardia. Influenza panel was negative  Hypertension Continue antihypertensive medications    Code status: Patient  is full code  Family discussion: No family at bedside   Time Spent on Admission: 60 minutes  [facility] Pager: [phone] 05/19/2013, 9:17 PM  If 7PM-7AM, please contact night-coverage  [email or web address]  Password [email or web address]

## 2013-05-19 NOTE — ED Notes (Signed)
Ambulated pt in hallway. O2 went down to 85%. After returning to room and sitting on the stretcher for a while O2 went up to 88 % without Oxygen. Nurse Jenny Reichmann informed.

## 2013-05-19 NOTE — ED Notes (Signed)
Pt ambulated in hallway a short distance, Pulse oximetry dropped to 85% on RA.

## 2013-05-19 NOTE — ED Notes (Signed)
Report given to Tabitha, RN.

## 2013-05-19 NOTE — ED Provider Notes (Signed)
CSN: [id number]     Arrival date & time 05/19/13  1710 History   First MD Initiated Contact with Patient 05/19/13 1755     Chief Complaint  Patient presents with  . Shortness of Breath     (Consider location/radiation/quality/duration/timing/severity/associated sxs/prior Treatment) HPI  63yf with cough, sob and congestion. Symptoms onset yesterday. Gradual and progressively worsening. Subjective fever. Seen by pcp before arrival and given neb with mild relief and referred to ED. No unusual leg pain or swelling. No cp. No history of diagnosed lung disease but is a smoker. No sick contacts. No orthopnea.   Past Medical History  Diagnosis Date  . Asthma   . Hypertension   . Anxiety    Past Surgical History  Procedure Laterality Date  . Abdominal hysterectomy    . Breast surgery      left lumpectomy  . Hernia repair    . Tonsillectomy     No family history on file. History  Substance Use Topics  . Smoking status: Current Every Day Smoker -- 1.00 packs/day    Types: Cigarettes  . Smokeless tobacco: Not on file  . Alcohol Use: No   OB History   Grav Para Term Preterm Abortions TAB SAB Ect Mult Living                 Review of Systems  All systems reviewed and negative, other than as noted in HPI.   Allergies  Codeine  Home Medications  No current outpatient prescriptions on file. BP 136/60  Pulse 131  Temp(Src) 99.8 F (37.7 C) (Oral)  Resp 19  Ht 5\' 1"  (1.549 m)  Wt 125 lb (56.7 kg)  BMI 23.63 kg/m2  SpO2 99% Physical Exam  Nursing note and vitals reviewed. Constitutional: She appears well-developed and well-nourished. No distress.  HENT:  Head: Normocephalic and atraumatic.  Eyes: Conjunctivae are normal. Right eye exhibits no discharge. Left eye exhibits no discharge.  Neck: Neck supple.  Cardiovascular: Normal rate, regular rhythm and normal heart sounds.  Exam reveals no gallop and no friction rub.   No murmur heard. Pulmonary/Chest: Effort normal.  No respiratory distress. She has wheezes.  Abdominal: Soft. She exhibits no distension. There is no tenderness.  Musculoskeletal: She exhibits no edema and no tenderness.  Lower extremities symmetric as compared to each other. No calf tenderness. Negative Homan's. No palpable cords.   Neurological: She is alert.  Skin: Skin is warm and dry.  Psychiatric: She has a normal mood and affect. Her behavior is normal. Thought content normal.    ED Course  Procedures (including critical care time) Labs Review Labs Reviewed  CBC WITH DIFFERENTIAL - Abnormal; Notable for the following:    WBC 16.1 (*)    RBC 5.31 (*)    Hemoglobin 16.0 (*)    Neutrophils Relative % 84 (*)    Neutro Abs 13.5 (*)    Lymphocytes Relative 11 (*)    All other components within normal limits  BASIC METABOLIC PANEL  INFLUENZA PANEL BY PCR (TYPE A & B, H1N1)   Imaging Review Dg Chest Portable 1 View  05/19/2013   CLINICAL DATA:  Cough, shortness of breath, hypertension  EXAM: PORTABLE CHEST - 1 VIEW  COMPARISON:  None.  FINDINGS: Cardiomediastinal silhouette is unremarkable. No acute infiltrate or pleural effusion. No pulmonary edema.  IMPRESSION: No active disease.   Electronically Signed   By: Lahoma Crocker M.D.   On: 05/19/2013 18:33     EKG Interpretation None  MDM   Final diagnoses:  Acute respiratory failure  SIRS (systemic inflammatory response syndrome)  Bronchospasm    63 year old female with cough and shortness of breath. Hypoxemia. Significant wheezing on exam. Work of breathing has improved after continuous neb. Chest x-ray does not show any acute abnormality, but with fever, leukocytosis and continued hypoxemia with ambulation she was covered with Levaquin. No home oxygen requirement. Will admit for further evaluation. Influenza pending.    Virgel Manifold, MD 05/23/13 [phone]

## 2013-05-19 NOTE — ED Notes (Signed)
Pt c/o sob and cough/congestion since yesterday. Pt seen by pcp today and given breathing tx.

## 2013-05-20 DIAGNOSIS — J9600 Acute respiratory failure, unspecified whether with hypoxia or hypercapnia: Secondary | ICD-10-CM

## 2013-05-20 DIAGNOSIS — J9801 Acute bronchospasm: Secondary | ICD-10-CM

## 2013-05-20 LAB — COMPREHENSIVE METABOLIC PANEL
ALK PHOS: 84 U/L (ref 39–117)
AST: 6 U/L (ref 0–37)
Albumin: 3.4 g/dL — ABNORMAL LOW (ref 3.5–5.2)
BILIRUBIN TOTAL: 0.5 mg/dL (ref 0.3–1.2)
BUN: 12 mg/dL (ref 6–23)
CHLORIDE: 100 meq/L (ref 96–112)
CO2: 26 mEq/L (ref 19–32)
Calcium: 9 mg/dL (ref 8.4–10.5)
Creatinine, Ser: 0.53 mg/dL (ref 0.50–1.10)
GLUCOSE: 241 mg/dL — AB (ref 70–99)
POTASSIUM: 3 meq/L — AB (ref 3.7–5.3)
SODIUM: 138 meq/L (ref 137–147)
Total Protein: 6.5 g/dL (ref 6.0–8.3)

## 2013-05-20 MED ORDER — POTASSIUM CHLORIDE CRYS ER 20 MEQ PO TBCR
40.0000 meq | EXTENDED_RELEASE_TABLET | Freq: Once | ORAL | Status: AC
  Administered 2013-05-20: 40 meq via ORAL
  Filled 2013-05-20: qty 2

## 2013-05-20 MED ORDER — LEVOFLOXACIN IN D5W 500 MG/100ML IV SOLN
500.0000 mg | INTRAVENOUS | Status: DC
  Filled 2013-05-20: qty 100

## 2013-05-20 MED ORDER — LEVOFLOXACIN 500 MG PO TABS: 500.0000 mg | ORAL_TABLET | Freq: Every day | ORAL | Status: DC

## 2013-05-20 MED ORDER — LEVALBUTEROL HCL 0.63 MG/3ML IN NEBU: 0.6300 mg | INHALATION_SOLUTION | Freq: Three times a day (TID) | RESPIRATORY_TRACT | Status: DC

## 2013-05-20 MED ORDER — GUAIFENESIN ER 600 MG PO TB12: 600.0000 mg | ORAL_TABLET | Freq: Two times a day (BID) | ORAL | Status: DC

## 2013-05-20 MED ORDER — PREDNISONE 10 MG PO TABS: ORAL_TABLET | ORAL | Status: DC

## 2013-05-20 MED ORDER — IPRATROPIUM BROMIDE 0.02 % IN SOLN: 0.5000 mg | Freq: Three times a day (TID) | RESPIRATORY_TRACT | Status: DC

## 2013-05-20 NOTE — Progress Notes (Signed)
Inpatient Diabetes Program Recommendations  AACE/ADA: New Consensus Statement on Inpatient Glycemic Control (2013)  Target Ranges:  Prepandial:   less than 140 mg/dL      Peak postprandial:   less than 180 mg/dL (1-2 hours)      Critically ill patients:  140 - 180 mg/dL   Results for [name], [name] (MRN [id number]) as of 05/20/2013 09:04  Ref. Range 05/19/2013 18:43 05/20/2013 04:58  Glucose Latest Range: 70-99 mg/dL 144 (H) 241 (H)   Diabetes history: No Outpatient Diabetes medications: NA Current orders for Inpatient glycemic control: None  Inpatient Diabetes Program Recommendations Correction (SSI): Please order Novolog correction scale ACHS whilie inpatient and receiving steroids. HgbA1C: Please order an A1C to evaluate glycemic control over the past 2-3 months.  Thanks, Barnie Alderman, RN, MSN, CCRN Diabetes Coordinator Inpatient Diabetes Program [phone] (Team Pager) [phone] (AP office) [phone] [facility] office)

## 2013-05-20 NOTE — Care Management Note (Signed)
UR completed 

## 2013-05-20 NOTE — Discharge Instructions (Signed)
Chronic Obstructive Pulmonary Disease  Chronic obstructive pulmonary disease (COPD) is a common lung condition in which airflow from the lungs is limited. COPD is a general term that can be used to describe many different lung problems that limit airflow, including both chronic bronchitis and emphysema.  If you have COPD, your lung function will probably never return to normal, but there are measures you can take to improve lung function and make yourself feel better.   CAUSES   · Smoking (common).    · Exposure to secondhand smoke.    · Genetic problems.  · Chronic inflammatory lung diseases or recurrent infections.  SYMPTOMS   · Shortness of breath, especially with physical activity.    · Deep, persistent (chronic) cough with a large amount of thick mucus.    · Wheezing.    · Rapid breaths (tachypnea).    · Gray or bluish discoloration (cyanosis) of the skin, especially in fingers, toes, or lips.    · Fatigue.    · Weight loss.    · Frequent infections or episodes when breathing symptoms become much worse (exacerbations).    · Chest tightness.  DIAGNOSIS   Your healthcare provider will take a medical history and perform a physical examination to make the initial diagnosis.  Additional tests for COPD may include:   · Lung (pulmonary) function tests.  · Chest X-ray.  · CT scan.  · Blood tests.  TREATMENT   Treatment available to help you feel better when you have COPD include:   · Inhaler and nebulizer medicines. These help manage the symptoms of COPD and make your breathing more comfortable  · Supplemental oxygen. Supplemental oxygen is only helpful if you have a low oxygen level in your blood.    · Exercise and physical activity. These are beneficial for nearly all people with COPD. Some people may also benefit from a pulmonary rehabilitation program.  HOME CARE INSTRUCTIONS   · Take all medicines (inhaled or pills) as directed by your health care provider.  · Only take over-the-counter or prescription medicines  for pain, fever, or discomfort as directed by your health care provider.    · Avoid over-the-counter medicines or cough syrups that dry up your airway (such as antihistamines) and slow down the elimination of secretions unless instructed otherwise by your healthcare provider.    · If you are a smoker, the most important thing that you can do is stop smoking. Continuing to smoke will cause further lung damage and breathing trouble. Ask your health care provider for help with quitting smoking. He or she can direct you to community resources or hospitals that provide support.  · Avoid exposure to irritants such as smoke, chemicals, and fumes that aggravate your breathing.  · Use oxygen therapy and pulmonary rehabilitation if directed by your health care provider. If you require home oxygen therapy, ask your healthcare provider whether you should purchase a pulse oximeter to measure your oxygen level at home.    · Avoid contact with individuals who have a contagious illness.  · Avoid extreme temperature and humidity changes.  · Eat healthy foods. Eating smaller, more frequent meals and resting before meals may help you maintain your strength.  · Stay active, but balance activity with periods of rest. Exercise and physical activity will help you maintain your ability to do things you want to do.  · Preventing infection and hospitalization is very important when you have COPD. Make sure to receive all the vaccines your health care provider recommends, especially the pneumococcal and influenza vaccines. Ask your healthcare provider whether you   need a pneumonia vaccine.  · Learn and use relaxation techniques to manage stress.  · Learn and use controlled breathing techniques as directed by your health care provider. Controlled breathing techniques include:    · Pursed lip breathing. Start by breathing in (inhaling) through your nose for 1 second. Then, purse your lips as if you were going to whistle and breathe out (exhale)  through the pursed lips for 2 seconds.    · Diaphragmatic breathing. Start by putting one hand on your abdomen just above your waist. Inhale slowly through your nose. The hand on your abdomen should move out. Then purse your lips and exhale slowly. You should be able to feel the hand on your abdomen moving in as you exhale.    · Learn and use controlled coughing to clear mucus from your lungs. Controlled coughing is a series of short, progressive coughs. The steps of controlled coughing are:    1. Lean your head slightly forward.    2. Breathe in deeply using diaphragmatic breathing.    3. Try to hold your breath for 3 seconds.    4. Keep your mouth slightly open while coughing twice.    5. Spit any mucus out into a tissue.    6. Rest and repeat the steps once or twice as needed.  SEEK MEDICAL CARE IF:   · You are coughing up more mucus than usual.    · There is a change in the color or thickness of your mucus.    · Your breathing is more labored than usual.    · Your breathing is faster than usual.    SEEK IMMEDIATE MEDICAL CARE IF:   · You have shortness of breath while you are resting.    · You have shortness of breath that prevents you from:  · Being able to talk.    · Performing your usual physical activities.    · You have chest pain lasting longer than 5 minutes.    · Your skin color is more cyanotic than usual.  · You measure low oxygen saturations for longer than 5 minutes with a pulse oximeter.  MAKE SURE YOU:   · Understand these instructions.  · Will watch your condition.  · Will get help right away if you are not doing well or get worse.  Document Released: 11/23/2004 Document Revised: 12/04/2012 Document Reviewed: 10/10/2012  [employer]® Patient Information ©2014 [employer].

## 2013-05-20 NOTE — Discharge Summary (Signed)
Physician Discharge Summary  [name]:[id number] DOB: 1950/09/14 DOA: 05/19/2013  PCP: Octavio Graves, DO  Admit date: 05/19/2013 Discharge date: 05/20/2013  Time spent: 35 minutes  Recommendations for Outpatient Follow-up:  1. Follow up with primary care physician in 2 weeks 2. Patient will be scheduled to see Dr. Luan Pulling in the outpatient setting 3. Patient encouraged to quit smoking  Discharge Diagnoses:  Principal Problem:   Acute bronchitis Active Problems:   Acute respiratory failure   HTN (hypertension)   Discharge Condition: improved  Diet recommendation: low salt  Filed Weights   05/19/13 1739  Weight: 56.7 kg (125 lb)    History of present illness:  63 year old [name] who has a past medical history of Asthma; Hypertension; and Anxiety.  Today presented to the ED with chief complaint of cough with shortness of breath going on for past 2 days. Patient also had one episode of vomiting which she attributes to about of coughing. Patient has been coughing up yellow to brown-colored phlegm. She has a history of asthma but never was diagnosed with COPD. She denies history of coronary artery disease. She admits to having fever, no chest pain, no nausea or diarrhea.  In the ED patient received nebulizer treatments, and has improved somewhat but O2 sats dropped to 85% after ambulating in the hallway.   Hospital Course:  Patient was admitted to the hospital shortness of breath and wheezing. She does not have acute bronchitis with likely underlying COPD exacerbation. She was started on intravenous steroids, antibiotics and bronchodilators. Her hypoxia resolved she is nonambulatory on room air with oxygen saturations greater than 90%. She is comfortable. She is requesting discharge home. She was placed on a prednisone taper, continue oral antibiotics continue nebulizer treatments. We will arrange followup with pulmonology as an outpatient. She would benefit from pulmonary  function tests once her acute issue has completely resolved.  Procedures:    Consultations:  none  Discharge Exam: Filed Vitals:   05/20/13 1429  BP: 115/60  Pulse: 85  Temp: 98 F (36.7 C)  Resp: 20    General: NAD Cardiovascular: S1, S2 RRR Respiratory: Diminshed breath sounds with minimal expiratory wheeze bilaterally.  Discharge Instructions  Discharge Orders   Future Orders Complete By Expires   Call MD for:  difficulty breathing, headache or visual disturbances  As directed    Call MD for:  temperature >100.4  As directed    Diet - low sodium heart healthy  As directed    Increase activity slowly  As directed        Medication List         albuterol 108 (90 BASE) MCG/ACT inhaler  Commonly known as:  PROVENTIL HFA;VENTOLIN HFA  Inhale 2 puffs into the lungs every 6 (six) hours as needed for wheezing or shortness of breath.     ALPRAZolam 0.5 MG tablet  Commonly known as:  XANAX  Take 0.5 mg by mouth 3 (three) times daily.     COMBIVENT 18-103 MCG/ACT inhaler  Generic drug:  albuterol-ipratropium  Inhale 1 puff into the lungs 4 (four) times daily.     ipratropium-albuterol 0.5-2.5 (3) MG/3ML Soln  Commonly known as:  DUONEB  Take 3 mLs by nebulization every 6 (six) hours as needed (for shortness of breath/wheezing).     guaiFENesin 600 MG 12 hr tablet  Commonly known as:  MUCINEX  Take 1 tablet (600 mg total) by mouth 2 (two) times daily.     levofloxacin 500 MG tablet  Commonly known as:  LEVAQUIN  Take 1 tablet (500 mg total) by mouth daily.     lisinopril-hydrochlorothiazide 10-12.5 MG per tablet  Commonly known as:  PRINZIDE,ZESTORETIC  Take 1 tablet by mouth daily.     metoprolol succinate 50 MG 24 hr tablet  Commonly known as:  TOPROL-XL  Take 50 mg by mouth daily. Take with or immediately following a meal.     oxyCODONE-acetaminophen 5-325 MG per tablet  Commonly known as:  PERCOCET/ROXICET  Take 1 tablet by mouth every 6 (six) hours  as needed for moderate pain or severe pain.     predniSONE 10 MG tablet  Commonly known as:  DELTASONE  Take 40mg  po daily for 2 days then 30mg  po daily for 2 days then 20mg  po daily for 2 days then 10mg  po daily for 2 days then stop       Allergies  Allergen Reactions  . Codeine Nausea And Vomiting  . Eggs Or Egg-Derived Products        Follow-up Information   Follow up with Peralta, Rockham, DO. Schedule an appointment as soon as possible for a visit on 05/26/2013. (10am)    Contact information:   [name] [id number] [phone]        The results of significant diagnostics from this hospitalization (including imaging, microbiology, ancillary and laboratory) are listed below for reference.    Significant Diagnostic Studies: Dg Chest Portable 1 View  05/19/2013   CLINICAL DATA:  Cough, shortness of breath, hypertension  EXAM: PORTABLE CHEST - 1 VIEW  COMPARISON:  None.  FINDINGS: Cardiomediastinal silhouette is unremarkable. No acute infiltrate or pleural effusion. No pulmonary edema.  IMPRESSION: No active disease.   Electronically Signed   By: Lahoma Crocker M.D.   On: 05/19/2013 18:33    Microbiology: No results found for this or any previous visit (from the past 240 hour(s)).   Labs: Basic Metabolic Panel:  Recent Labs Lab 05/19/13 1843 05/20/13 0458  NA 136* 138  K 3.6* 3.0*  CL 96 100  CO2 28 26  GLUCOSE 144* 241*  BUN 12 12  CREATININE 0.55 0.Patty  CALCIUM 9.3 9.0   Liver Function Tests:  Recent Labs Lab 05/20/13 0458  AST 6  ALT <5  ALKPHOS 84  BILITOT 0.5  PROT 6.5  ALBUMIN 3.4*   No results found for this basename: LIPASE, AMYLASE,  in the last 168 hours No results found for this basename: AMMONIA,  in the last 168 hours CBC:  Recent Labs Lab 05/19/13 1843  WBC 16.1*  NEUTROABS 13.5*  HGB 16.0*  HCT 45.9  MCV 86.4  PLT 166   Cardiac Enzymes: No results found for this basename: CKTOTAL, CKMB, CKMBINDEX, TROPONINI,  in  the last 168 hours BNP: BNP (last 3 results) No results found for this basename: PROBNP,  in the last 8760 hours CBG: No results found for this basename: GLUCAP,  in the last 168 hours     Signed:  Rashawn Rayman  [facility] 05/20/2013, 2:34 PM

## 2013-05-20 NOTE — Progress Notes (Signed)
Patient ambulated in hallway approx 200 ft. Patient on room air and continuous pulse oxymetry at the time of ambulation. Patient tolerated ambulation well, oxygen saturation dropped to 91% during ambulation.

## 2013-05-20 NOTE — Progress Notes (Signed)
Patient given discharge instructions. Patient verbalized understanding of all discharge instructions and follow up appointments. Patient alert, oriented and in stable condition at the time of discharge. Patient discharged home with son.

## 2013-05-20 NOTE — Progress Notes (Signed)
ANTIBIOTIC CONSULT NOTE - INITIAL  Pharmacy Consult for Levaquin Indication: acute bronchitis  Allergies  Allergen Reactions  . Codeine Nausea And Vomiting  . Eggs Or Egg-Derived Products    Patient Measurements: Height: 5\' 1"  (154.9 cm) Weight: 125 lb (56.7 kg) IBW/kg (Calculated) : 47.8  Vital Signs: Temp: 98.8 F (37.1 C) (03/24 0529) Temp src: Oral (03/24 0529) BP: 114/50 mmHg (03/24 1022) Pulse Rate: 104 (03/24 0734) Intake/Output from previous day:   Intake/Output from this shift:    Labs:  Recent Labs  05/19/13 1843 05/20/13 0458  WBC 16.1*  --   HGB 16.0*  --   PLT 166  --   CREATININE 0.55 0.53   Estimated Creatinine Clearance: 53.6 ml/min (by C-G formula based on Cr of 0.53). No results found for this basename: VANCOTROUGH, VANCOPEAK, VANCORANDOM, GENTTROUGH, GENTPEAK, GENTRANDOM, TOBRATROUGH, TOBRAPEAK, TOBRARND, AMIKACINPEAK, AMIKACINTROU, AMIKACIN,  in the last 72 hours   Microbiology: No results found for this or any previous visit (from the past 720 hour(s)).  Medical History: Past Medical History  Diagnosis Date  . Asthma   . Hypertension   . Anxiety    Medications:  Scheduled:  . ALPRAZolam  0.5 mg Oral TID  . dextromethorphan-guaiFENesin  1 tablet Oral BID  . enoxaparin (LOVENOX) injection  40 mg Subcutaneous Q24H  . lisinopril  10 mg Oral Daily   And  . hydrochlorothiazide  12.5 mg Oral Daily  . ipratropium  0.5 mg Nebulization TID  . levalbuterol  0.63 mg Nebulization TID  . levofloxacin (LEVAQUIN) IV  500 mg Intravenous Q24H  . methylPREDNISolone (SOLU-MEDROL) injection  60 mg Intravenous Q6H  . metoprolol succinate  50 mg Oral Daily  . sodium chloride  3 mL Intravenous Q12H  . sodium chloride  3 mL Intravenous Q12H   Assessment: 63yo female admitted with cough and SOB.  Initiating Levaquin for acute bronchitis.  Pt has good renal fxn.  Estimated Creatinine Clearance: 53.6 ml/min (by C-G formula based on Cr of 0.53).  Levaquin  3/23 >>  Goal of Therapy:  Eradicate infection.  Plan:   Levaquin 500mg  IV q24hrs  Switch to PO when improved  Monitor labs, renal fxn, and progress  [place] Crane, Stokes Rattigan A 05/20/2013,10:45 AM

## 2013-05-20 NOTE — Care Management Note (Signed)
    Page 1 of 1   05/20/2013     1:21:34 PM   CARE MANAGEMENT NOTE 05/20/2013  Patient:  [name], [name]   Account Number:  [id number]  Date Initiated:  05/20/2013  Documentation initiated by:  Claretha Cooper  Subjective/Objective Assessment:   Pt from home where she lives with family. Independent with ADL/     Action/Plan:   Anticipated DC Date:     Anticipated DC Plan:  HOME/SELF CARE      DC Planning Services  CM consult      Choice offered to / List presented to:             Status of service:  Completed, signed off Medicare Important Message given?   (If response is "NO", the following Medicare IM given date fields will be blank) Date Medicare IM given:   Date Additional Medicare IM given:    Discharge Disposition:    Per UR Regulation:    If discussed at Long Length of Stay Meetings, dates discussed:    Comments:  05/20/13 Claretha Cooper RN BSN CM

## 2015-04-06 DIAGNOSIS — I10 Essential (primary) hypertension: Secondary | ICD-10-CM | POA: Diagnosis not present

## 2015-04-06 DIAGNOSIS — M9909 Segmental and somatic dysfunction of abdomen and other regions: Secondary | ICD-10-CM | POA: Diagnosis not present

## 2015-04-06 DIAGNOSIS — J329 Chronic sinusitis, unspecified: Secondary | ICD-10-CM | POA: Diagnosis not present

## 2015-04-06 DIAGNOSIS — S161XXD Strain of muscle, fascia and tendon at neck level, subsequent encounter: Secondary | ICD-10-CM | POA: Diagnosis not present

## 2015-04-06 DIAGNOSIS — J45909 Unspecified asthma, uncomplicated: Secondary | ICD-10-CM | POA: Diagnosis not present

## 2015-04-06 DIAGNOSIS — F341 Dysthymic disorder: Secondary | ICD-10-CM | POA: Diagnosis not present

## 2015-04-07 DIAGNOSIS — J45909 Unspecified asthma, uncomplicated: Secondary | ICD-10-CM | POA: Diagnosis not present

## 2015-06-01 DIAGNOSIS — J45909 Unspecified asthma, uncomplicated: Secondary | ICD-10-CM | POA: Diagnosis not present

## 2015-06-01 DIAGNOSIS — I10 Essential (primary) hypertension: Secondary | ICD-10-CM | POA: Diagnosis not present

## 2015-06-01 DIAGNOSIS — F341 Dysthymic disorder: Secondary | ICD-10-CM | POA: Diagnosis not present

## 2015-06-01 DIAGNOSIS — F418 Other specified anxiety disorders: Secondary | ICD-10-CM | POA: Diagnosis not present

## 2015-06-01 DIAGNOSIS — J028 Acute pharyngitis due to other specified organisms: Secondary | ICD-10-CM | POA: Diagnosis not present

## 2015-06-01 DIAGNOSIS — Z780 Asymptomatic menopausal state: Secondary | ICD-10-CM | POA: Diagnosis not present

## 2015-06-01 DIAGNOSIS — K589 Irritable bowel syndrome without diarrhea: Secondary | ICD-10-CM | POA: Diagnosis not present

## 2015-06-01 DIAGNOSIS — J449 Chronic obstructive pulmonary disease, unspecified: Secondary | ICD-10-CM | POA: Diagnosis not present

## 2015-06-01 DIAGNOSIS — K580 Irritable bowel syndrome with diarrhea: Secondary | ICD-10-CM | POA: Diagnosis not present

## 2015-06-01 DIAGNOSIS — M159 Polyosteoarthritis, unspecified: Secondary | ICD-10-CM | POA: Diagnosis not present

## 2015-06-01 DIAGNOSIS — Z0001 Encounter for general adult medical examination with abnormal findings: Secondary | ICD-10-CM | POA: Diagnosis not present

## 2015-07-12 DIAGNOSIS — K5901 Slow transit constipation: Secondary | ICD-10-CM | POA: Diagnosis not present

## 2015-07-12 DIAGNOSIS — M159 Polyosteoarthritis, unspecified: Secondary | ICD-10-CM | POA: Diagnosis not present

## 2015-07-12 DIAGNOSIS — I10 Essential (primary) hypertension: Secondary | ICD-10-CM | POA: Diagnosis not present

## 2015-07-12 DIAGNOSIS — K580 Irritable bowel syndrome with diarrhea: Secondary | ICD-10-CM | POA: Diagnosis not present

## 2015-07-12 DIAGNOSIS — E785 Hyperlipidemia, unspecified: Secondary | ICD-10-CM | POA: Diagnosis not present

## 2015-07-12 DIAGNOSIS — M9909 Segmental and somatic dysfunction of abdomen and other regions: Secondary | ICD-10-CM | POA: Diagnosis not present

## 2015-07-12 DIAGNOSIS — J449 Chronic obstructive pulmonary disease, unspecified: Secondary | ICD-10-CM | POA: Diagnosis not present

## 2015-07-12 DIAGNOSIS — K589 Irritable bowel syndrome without diarrhea: Secondary | ICD-10-CM | POA: Diagnosis not present

## 2015-07-12 DIAGNOSIS — F418 Other specified anxiety disorders: Secondary | ICD-10-CM | POA: Diagnosis not present

## 2015-07-12 DIAGNOSIS — J45909 Unspecified asthma, uncomplicated: Secondary | ICD-10-CM | POA: Diagnosis not present

## 2015-07-12 DIAGNOSIS — Z780 Asymptomatic menopausal state: Secondary | ICD-10-CM | POA: Diagnosis not present

## 2015-07-12 DIAGNOSIS — F341 Dysthymic disorder: Secondary | ICD-10-CM | POA: Diagnosis not present

## 2015-08-26 DIAGNOSIS — F418 Other specified anxiety disorders: Secondary | ICD-10-CM | POA: Diagnosis not present

## 2015-08-26 DIAGNOSIS — J45909 Unspecified asthma, uncomplicated: Secondary | ICD-10-CM | POA: Diagnosis not present

## 2015-08-26 DIAGNOSIS — F341 Dysthymic disorder: Secondary | ICD-10-CM | POA: Diagnosis not present

## 2015-08-26 DIAGNOSIS — K5901 Slow transit constipation: Secondary | ICD-10-CM | POA: Diagnosis not present

## 2015-08-26 DIAGNOSIS — K580 Irritable bowel syndrome with diarrhea: Secondary | ICD-10-CM | POA: Diagnosis not present

## 2015-08-26 DIAGNOSIS — E785 Hyperlipidemia, unspecified: Secondary | ICD-10-CM | POA: Diagnosis not present

## 2015-08-26 DIAGNOSIS — M159 Polyosteoarthritis, unspecified: Secondary | ICD-10-CM | POA: Diagnosis not present

## 2015-08-26 DIAGNOSIS — Z780 Asymptomatic menopausal state: Secondary | ICD-10-CM | POA: Diagnosis not present

## 2015-08-26 DIAGNOSIS — M9909 Segmental and somatic dysfunction of abdomen and other regions: Secondary | ICD-10-CM | POA: Diagnosis not present

## 2015-08-26 DIAGNOSIS — S39012A Strain of muscle, fascia and tendon of lower back, initial encounter: Secondary | ICD-10-CM | POA: Diagnosis not present

## 2015-08-26 DIAGNOSIS — I10 Essential (primary) hypertension: Secondary | ICD-10-CM | POA: Diagnosis not present

## 2015-08-26 DIAGNOSIS — J449 Chronic obstructive pulmonary disease, unspecified: Secondary | ICD-10-CM | POA: Diagnosis not present

## 2015-11-25 DIAGNOSIS — J329 Chronic sinusitis, unspecified: Secondary | ICD-10-CM | POA: Diagnosis not present

## 2015-11-25 DIAGNOSIS — I10 Essential (primary) hypertension: Secondary | ICD-10-CM | POA: Diagnosis not present

## 2015-11-25 DIAGNOSIS — J45909 Unspecified asthma, uncomplicated: Secondary | ICD-10-CM | POA: Diagnosis not present

## 2015-11-25 DIAGNOSIS — Z780 Asymptomatic menopausal state: Secondary | ICD-10-CM | POA: Diagnosis not present

## 2015-11-25 DIAGNOSIS — M159 Polyosteoarthritis, unspecified: Secondary | ICD-10-CM | POA: Diagnosis not present

## 2015-11-25 DIAGNOSIS — K580 Irritable bowel syndrome with diarrhea: Secondary | ICD-10-CM | POA: Diagnosis not present

## 2015-11-25 DIAGNOSIS — M9909 Segmental and somatic dysfunction of abdomen and other regions: Secondary | ICD-10-CM | POA: Diagnosis not present

## 2015-11-25 DIAGNOSIS — F341 Dysthymic disorder: Secondary | ICD-10-CM | POA: Diagnosis not present

## 2015-11-25 DIAGNOSIS — J449 Chronic obstructive pulmonary disease, unspecified: Secondary | ICD-10-CM | POA: Diagnosis not present

## 2015-11-25 DIAGNOSIS — F418 Other specified anxiety disorders: Secondary | ICD-10-CM | POA: Diagnosis not present

## 2015-11-25 DIAGNOSIS — R300 Dysuria: Secondary | ICD-10-CM | POA: Diagnosis not present

## 2015-12-30 DIAGNOSIS — K580 Irritable bowel syndrome with diarrhea: Secondary | ICD-10-CM | POA: Diagnosis not present

## 2015-12-30 DIAGNOSIS — I10 Essential (primary) hypertension: Secondary | ICD-10-CM | POA: Diagnosis not present

## 2015-12-30 DIAGNOSIS — F418 Other specified anxiety disorders: Secondary | ICD-10-CM | POA: Diagnosis not present

## 2015-12-30 DIAGNOSIS — M5030 Other cervical disc degeneration, unspecified cervical region: Secondary | ICD-10-CM | POA: Diagnosis not present

## 2015-12-30 DIAGNOSIS — K5901 Slow transit constipation: Secondary | ICD-10-CM | POA: Diagnosis not present

## 2015-12-30 DIAGNOSIS — J449 Chronic obstructive pulmonary disease, unspecified: Secondary | ICD-10-CM | POA: Diagnosis not present

## 2015-12-30 DIAGNOSIS — G8929 Other chronic pain: Secondary | ICD-10-CM | POA: Diagnosis not present

## 2015-12-30 DIAGNOSIS — M9909 Segmental and somatic dysfunction of abdomen and other regions: Secondary | ICD-10-CM | POA: Diagnosis not present

## 2015-12-30 DIAGNOSIS — J45909 Unspecified asthma, uncomplicated: Secondary | ICD-10-CM | POA: Diagnosis not present

## 2016-04-20 DIAGNOSIS — F418 Other specified anxiety disorders: Secondary | ICD-10-CM | POA: Diagnosis not present

## 2016-04-20 DIAGNOSIS — F17210 Nicotine dependence, cigarettes, uncomplicated: Secondary | ICD-10-CM | POA: Diagnosis not present

## 2016-04-20 DIAGNOSIS — K5901 Slow transit constipation: Secondary | ICD-10-CM | POA: Diagnosis not present

## 2016-04-20 DIAGNOSIS — M9901 Segmental and somatic dysfunction of cervical region: Secondary | ICD-10-CM | POA: Diagnosis not present

## 2016-04-20 DIAGNOSIS — Z6824 Body mass index (BMI) 24.0-24.9, adult: Secondary | ICD-10-CM | POA: Diagnosis not present

## 2016-04-20 DIAGNOSIS — K219 Gastro-esophageal reflux disease without esophagitis: Secondary | ICD-10-CM | POA: Diagnosis not present

## 2016-04-20 DIAGNOSIS — R062 Wheezing: Secondary | ICD-10-CM | POA: Diagnosis not present

## 2016-04-20 DIAGNOSIS — M5136 Other intervertebral disc degeneration, lumbar region: Secondary | ICD-10-CM | POA: Diagnosis not present

## 2016-04-20 DIAGNOSIS — J4540 Moderate persistent asthma, uncomplicated: Secondary | ICD-10-CM | POA: Diagnosis not present

## 2016-04-20 DIAGNOSIS — I10 Essential (primary) hypertension: Secondary | ICD-10-CM | POA: Diagnosis not present

## 2016-04-24 DIAGNOSIS — Z0189 Encounter for other specified special examinations: Secondary | ICD-10-CM | POA: Diagnosis not present

## 2016-05-23 DIAGNOSIS — J449 Chronic obstructive pulmonary disease, unspecified: Secondary | ICD-10-CM | POA: Diagnosis not present

## 2016-05-23 DIAGNOSIS — K581 Irritable bowel syndrome with constipation: Secondary | ICD-10-CM | POA: Diagnosis not present

## 2016-05-23 DIAGNOSIS — N390 Urinary tract infection, site not specified: Secondary | ICD-10-CM | POA: Diagnosis not present

## 2016-05-23 DIAGNOSIS — M9901 Segmental and somatic dysfunction of cervical region: Secondary | ICD-10-CM | POA: Diagnosis not present

## 2016-05-23 DIAGNOSIS — Z6824 Body mass index (BMI) 24.0-24.9, adult: Secondary | ICD-10-CM | POA: Diagnosis not present

## 2016-05-23 DIAGNOSIS — F17210 Nicotine dependence, cigarettes, uncomplicated: Secondary | ICD-10-CM | POA: Diagnosis not present

## 2016-05-23 DIAGNOSIS — K5901 Slow transit constipation: Secondary | ICD-10-CM | POA: Diagnosis not present

## 2016-05-23 DIAGNOSIS — F418 Other specified anxiety disorders: Secondary | ICD-10-CM | POA: Diagnosis not present

## 2016-05-23 DIAGNOSIS — K219 Gastro-esophageal reflux disease without esophagitis: Secondary | ICD-10-CM | POA: Diagnosis not present

## 2016-05-23 DIAGNOSIS — J4540 Moderate persistent asthma, uncomplicated: Secondary | ICD-10-CM | POA: Diagnosis not present

## 2016-05-23 DIAGNOSIS — M5136 Other intervertebral disc degeneration, lumbar region: Secondary | ICD-10-CM | POA: Diagnosis not present

## 2016-05-23 DIAGNOSIS — I10 Essential (primary) hypertension: Secondary | ICD-10-CM | POA: Diagnosis not present

## 2016-07-10 DIAGNOSIS — M5136 Other intervertebral disc degeneration, lumbar region: Secondary | ICD-10-CM | POA: Diagnosis not present

## 2016-07-10 DIAGNOSIS — J449 Chronic obstructive pulmonary disease, unspecified: Secondary | ICD-10-CM | POA: Diagnosis not present

## 2016-07-10 DIAGNOSIS — I10 Essential (primary) hypertension: Secondary | ICD-10-CM | POA: Diagnosis not present

## 2016-07-10 DIAGNOSIS — M9901 Segmental and somatic dysfunction of cervical region: Secondary | ICD-10-CM | POA: Diagnosis not present

## 2016-07-10 DIAGNOSIS — K219 Gastro-esophageal reflux disease without esophagitis: Secondary | ICD-10-CM | POA: Diagnosis not present

## 2016-07-10 DIAGNOSIS — Z6824 Body mass index (BMI) 24.0-24.9, adult: Secondary | ICD-10-CM | POA: Diagnosis not present

## 2016-07-10 DIAGNOSIS — F17210 Nicotine dependence, cigarettes, uncomplicated: Secondary | ICD-10-CM | POA: Diagnosis not present

## 2016-07-10 DIAGNOSIS — F418 Other specified anxiety disorders: Secondary | ICD-10-CM | POA: Diagnosis not present

## 2016-07-10 DIAGNOSIS — J4540 Moderate persistent asthma, uncomplicated: Secondary | ICD-10-CM | POA: Diagnosis not present

## 2016-07-10 DIAGNOSIS — K581 Irritable bowel syndrome with constipation: Secondary | ICD-10-CM | POA: Diagnosis not present

## 2016-07-10 DIAGNOSIS — R062 Wheezing: Secondary | ICD-10-CM | POA: Diagnosis not present

## 2016-07-10 DIAGNOSIS — K5901 Slow transit constipation: Secondary | ICD-10-CM | POA: Diagnosis not present

## 2016-07-11 DIAGNOSIS — M2042 Other hammer toe(s) (acquired), left foot: Secondary | ICD-10-CM | POA: Diagnosis not present

## 2016-07-11 DIAGNOSIS — M779 Enthesopathy, unspecified: Secondary | ICD-10-CM | POA: Diagnosis not present

## 2016-07-11 DIAGNOSIS — M79671 Pain in right foot: Secondary | ICD-10-CM | POA: Diagnosis not present

## 2016-09-11 DIAGNOSIS — M5136 Other intervertebral disc degeneration, lumbar region: Secondary | ICD-10-CM | POA: Diagnosis not present

## 2016-09-11 DIAGNOSIS — K5901 Slow transit constipation: Secondary | ICD-10-CM | POA: Diagnosis not present

## 2016-09-11 DIAGNOSIS — R062 Wheezing: Secondary | ICD-10-CM | POA: Diagnosis not present

## 2016-09-11 DIAGNOSIS — Z6824 Body mass index (BMI) 24.0-24.9, adult: Secondary | ICD-10-CM | POA: Diagnosis not present

## 2016-09-11 DIAGNOSIS — K219 Gastro-esophageal reflux disease without esophagitis: Secondary | ICD-10-CM | POA: Diagnosis not present

## 2016-09-11 DIAGNOSIS — I10 Essential (primary) hypertension: Secondary | ICD-10-CM | POA: Diagnosis not present

## 2016-09-11 DIAGNOSIS — J4540 Moderate persistent asthma, uncomplicated: Secondary | ICD-10-CM | POA: Diagnosis not present

## 2016-09-11 DIAGNOSIS — M9901 Segmental and somatic dysfunction of cervical region: Secondary | ICD-10-CM | POA: Diagnosis not present

## 2016-09-11 DIAGNOSIS — F17210 Nicotine dependence, cigarettes, uncomplicated: Secondary | ICD-10-CM | POA: Diagnosis not present

## 2016-09-11 DIAGNOSIS — J449 Chronic obstructive pulmonary disease, unspecified: Secondary | ICD-10-CM | POA: Diagnosis not present

## 2016-09-11 DIAGNOSIS — F418 Other specified anxiety disorders: Secondary | ICD-10-CM | POA: Diagnosis not present

## 2016-09-19 DIAGNOSIS — M2041 Other hammer toe(s) (acquired), right foot: Secondary | ICD-10-CM | POA: Diagnosis not present

## 2016-09-19 DIAGNOSIS — M79674 Pain in right toe(s): Secondary | ICD-10-CM | POA: Diagnosis not present

## 2016-10-11 DIAGNOSIS — L821 Other seborrheic keratosis: Secondary | ICD-10-CM | POA: Diagnosis not present

## 2016-10-11 DIAGNOSIS — C44311 Basal cell carcinoma of skin of nose: Secondary | ICD-10-CM | POA: Diagnosis not present

## 2016-10-11 DIAGNOSIS — L308 Other specified dermatitis: Secondary | ICD-10-CM | POA: Diagnosis not present

## 2016-11-07 DIAGNOSIS — J449 Chronic obstructive pulmonary disease, unspecified: Secondary | ICD-10-CM | POA: Diagnosis not present

## 2016-11-07 DIAGNOSIS — F418 Other specified anxiety disorders: Secondary | ICD-10-CM | POA: Diagnosis not present

## 2016-11-07 DIAGNOSIS — M5136 Other intervertebral disc degeneration, lumbar region: Secondary | ICD-10-CM | POA: Diagnosis not present

## 2016-11-07 DIAGNOSIS — K219 Gastro-esophageal reflux disease without esophagitis: Secondary | ICD-10-CM | POA: Diagnosis not present

## 2016-11-07 DIAGNOSIS — R062 Wheezing: Secondary | ICD-10-CM | POA: Diagnosis not present

## 2016-11-07 DIAGNOSIS — K581 Irritable bowel syndrome with constipation: Secondary | ICD-10-CM | POA: Diagnosis not present

## 2016-11-07 DIAGNOSIS — Z6824 Body mass index (BMI) 24.0-24.9, adult: Secondary | ICD-10-CM | POA: Diagnosis not present

## 2016-11-07 DIAGNOSIS — I10 Essential (primary) hypertension: Secondary | ICD-10-CM | POA: Diagnosis not present

## 2016-11-07 DIAGNOSIS — K5901 Slow transit constipation: Secondary | ICD-10-CM | POA: Diagnosis not present

## 2016-11-07 DIAGNOSIS — F17210 Nicotine dependence, cigarettes, uncomplicated: Secondary | ICD-10-CM | POA: Diagnosis not present

## 2016-11-07 DIAGNOSIS — M9901 Segmental and somatic dysfunction of cervical region: Secondary | ICD-10-CM | POA: Diagnosis not present

## 2016-11-07 DIAGNOSIS — J4540 Moderate persistent asthma, uncomplicated: Secondary | ICD-10-CM | POA: Diagnosis not present

## 2016-11-16 DIAGNOSIS — C44311 Basal cell carcinoma of skin of nose: Secondary | ICD-10-CM | POA: Diagnosis not present

## 2016-11-16 DIAGNOSIS — Z85828 Personal history of other malignant neoplasm of skin: Secondary | ICD-10-CM | POA: Diagnosis not present

## 2016-11-16 DIAGNOSIS — Z08 Encounter for follow-up examination after completed treatment for malignant neoplasm: Secondary | ICD-10-CM | POA: Diagnosis not present

## 2016-11-28 DIAGNOSIS — M79674 Pain in right toe(s): Secondary | ICD-10-CM | POA: Diagnosis not present

## 2016-11-28 DIAGNOSIS — M779 Enthesopathy, unspecified: Secondary | ICD-10-CM | POA: Diagnosis not present

## 2016-11-28 DIAGNOSIS — M2041 Other hammer toe(s) (acquired), right foot: Secondary | ICD-10-CM | POA: Diagnosis not present

## 2016-12-25 DIAGNOSIS — I10 Essential (primary) hypertension: Secondary | ICD-10-CM | POA: Diagnosis not present

## 2016-12-25 DIAGNOSIS — J4540 Moderate persistent asthma, uncomplicated: Secondary | ICD-10-CM | POA: Diagnosis not present

## 2016-12-25 DIAGNOSIS — F418 Other specified anxiety disorders: Secondary | ICD-10-CM | POA: Diagnosis not present

## 2016-12-25 DIAGNOSIS — K219 Gastro-esophageal reflux disease without esophagitis: Secondary | ICD-10-CM | POA: Diagnosis not present

## 2016-12-25 DIAGNOSIS — R062 Wheezing: Secondary | ICD-10-CM | POA: Diagnosis not present

## 2016-12-25 DIAGNOSIS — M5136 Other intervertebral disc degeneration, lumbar region: Secondary | ICD-10-CM | POA: Diagnosis not present

## 2016-12-25 DIAGNOSIS — M5431 Sciatica, right side: Secondary | ICD-10-CM | POA: Diagnosis not present

## 2016-12-25 DIAGNOSIS — M9901 Segmental and somatic dysfunction of cervical region: Secondary | ICD-10-CM | POA: Diagnosis not present

## 2016-12-25 DIAGNOSIS — F17210 Nicotine dependence, cigarettes, uncomplicated: Secondary | ICD-10-CM | POA: Diagnosis not present

## 2016-12-25 DIAGNOSIS — K5901 Slow transit constipation: Secondary | ICD-10-CM | POA: Diagnosis not present

## 2016-12-25 DIAGNOSIS — Z6824 Body mass index (BMI) 24.0-24.9, adult: Secondary | ICD-10-CM | POA: Diagnosis not present

## 2016-12-28 DIAGNOSIS — C44311 Basal cell carcinoma of skin of nose: Secondary | ICD-10-CM | POA: Diagnosis not present

## 2017-02-02 DIAGNOSIS — R062 Wheezing: Secondary | ICD-10-CM | POA: Diagnosis not present

## 2017-02-02 DIAGNOSIS — F17210 Nicotine dependence, cigarettes, uncomplicated: Secondary | ICD-10-CM | POA: Diagnosis not present

## 2017-02-02 DIAGNOSIS — Z6824 Body mass index (BMI) 24.0-24.9, adult: Secondary | ICD-10-CM | POA: Diagnosis not present

## 2017-02-02 DIAGNOSIS — J019 Acute sinusitis, unspecified: Secondary | ICD-10-CM | POA: Diagnosis not present

## 2017-02-02 DIAGNOSIS — J45909 Unspecified asthma, uncomplicated: Secondary | ICD-10-CM | POA: Diagnosis not present

## 2017-02-10 ENCOUNTER — Emergency Department (HOSPITAL_COMMUNITY)
Admission: EM | Admit: 2017-02-10 | Discharge: 2017-02-10 | Disposition: A | Discharge status: Home / Self Care | Payer: BLUE CROSS/BLUE SHIELD | Attending: Emergency Medicine | Admitting: Emergency Medicine

## 2017-02-10 ENCOUNTER — Emergency Department (HOSPITAL_COMMUNITY): Payer: BLUE CROSS/BLUE SHIELD

## 2017-02-10 ENCOUNTER — Encounter (HOSPITAL_COMMUNITY): Payer: Self-pay | Admitting: *Deleted

## 2017-02-10 DIAGNOSIS — M25561 Pain in right knee: Secondary | ICD-10-CM

## 2017-02-10 DIAGNOSIS — J45909 Unspecified asthma, uncomplicated: Secondary | ICD-10-CM | POA: Insufficient documentation

## 2017-02-10 DIAGNOSIS — F17210 Nicotine dependence, cigarettes, uncomplicated: Secondary | ICD-10-CM | POA: Diagnosis not present

## 2017-02-10 DIAGNOSIS — I10 Essential (primary) hypertension: Secondary | ICD-10-CM | POA: Insufficient documentation

## 2017-02-10 DIAGNOSIS — J9600 Acute respiratory failure, unspecified whether with hypoxia or hypercapnia: Secondary | ICD-10-CM | POA: Insufficient documentation

## 2017-02-10 DIAGNOSIS — M25461 Effusion, right knee: Secondary | ICD-10-CM | POA: Insufficient documentation

## 2017-02-10 DIAGNOSIS — J449 Chronic obstructive pulmonary disease, unspecified: Secondary | ICD-10-CM | POA: Insufficient documentation

## 2017-02-10 HISTORY — DX: Chronic obstructive pulmonary disease, unspecified: J44.9

## 2017-02-10 MED ORDER — NAPROXEN 375 MG PO TABS: 375.0000 mg | ORAL_TABLET | Freq: Two times a day (BID) | ORAL | 0 refills | Status: DC

## 2017-02-10 MED ORDER — TRAMADOL HCL 50 MG PO TABS: 50.0000 mg | ORAL_TABLET | Freq: Four times a day (QID) | ORAL | 0 refills | Status: DC | PRN

## 2017-02-10 NOTE — ED Notes (Signed)
Pt works and is on her feet all day as a spooler She reports that she does not know what made her knee hurt as she has no know injury She reports that she has used bayer asa when pain is intense wothout much improvement

## 2017-02-10 NOTE — ED Triage Notes (Signed)
Pt right knee for 4 days, denies injury to knee.

## 2017-02-10 NOTE — ED Notes (Signed)
Awaiting DC instruct

## 2017-02-10 NOTE — ED Notes (Signed)
TT in to assess 

## 2017-02-10 NOTE — Discharge Instructions (Signed)
Minimal weight bearing for few days.  Apply ice packs on/off.  Wear the brace when walking or standing.  May remove at bedtime and for bathing.  Call one of the orthopedic providers listed to arrange a follow-up appt.

## 2017-02-12 NOTE — ED Provider Notes (Signed)
[facility] EMERGENCY DEPARTMENT Provider Note   CSN: [id number] Arrival date & time: 02/10/17  1128     History   Chief Complaint Chief Complaint  Patient presents with  . Knee Pain    HPI [name] is a 66 y.o. female.  HPI  [name] is a 66 y.o. female who presents to the Emergency Department complaining of right knee pain and swelling for 4 days.  Pain has been constant worse with weightbearing.  She states her job involves standing and  bending she believes her activities may have contributed to this.  She denies known injury.  She is tried over-the-counter pain relievers without relief.  She denies redness, numbness or weakness of the extremity, fever, chills.   Past Medical History:  Diagnosis Date  . Anxiety   . Asthma   . COPD (chronic obstructive pulmonary disease) ([place])   . Hypertension     Patient Active Problem List   Diagnosis Date Noted  . Acute respiratory failure ([place]) 05/19/2013  . Acute bronchitis 05/19/2013  . HTN (hypertension) 05/19/2013    Past Surgical History:  Procedure Laterality Date  . ABDOMINAL HYSTERECTOMY    . BREAST SURGERY     left lumpectomy  . HERNIA REPAIR    . TONSILLECTOMY      OB History    No data available       Home Medications    Prior to Admission medications   Medication Sig Start Date End Date Taking? Authorizing Provider  albuterol (PROVENTIL HFA;VENTOLIN HFA) 108 (90 BASE) MCG/ACT inhaler Inhale 2 puffs into the lungs every 6 (six) hours as needed for wheezing or shortness of breath.    [provider]  albuterol-ipratropium (COMBIVENT) 18-103 MCG/ACT inhaler Inhale 1 puff into the lungs 4 (four) times daily.    [provider]  ALPRAZolam Duanne Moron) 0.5 MG tablet Take 0.5 mg by mouth 3 (three) times daily.    [provider]  guaiFENesin (MUCINEX) 600 MG 12 hr tablet Take 1 tablet (600 mg total) by mouth 2 (two) times daily. 05/20/13   Kathie Dike, MD    ipratropium-albuterol (DUONEB) 0.5-2.5 (3) MG/3ML SOLN Take 3 mLs by nebulization every 6 (six) hours as needed (for shortness of breath/wheezing).    [provider]  levofloxacin (LEVAQUIN) 500 MG tablet Take 1 tablet (500 mg total) by mouth daily. 05/20/13   Kathie Dike, MD  lisinopril-hydrochlorothiazide (PRINZIDE,ZESTORETIC) 10-12.5 MG per tablet Take 1 tablet by mouth daily.    [provider]  metoprolol succinate (TOPROL-XL) 50 MG 24 hr tablet Take 50 mg by mouth daily. Take with or immediately following a meal.    [provider]  naproxen (NAPROSYN) 375 MG tablet Take 1 tablet (375 mg total) by mouth 2 (two) times daily with a meal. 02/10/17   Sharmarke Cicio, PA-C  oxyCODONE-acetaminophen (PERCOCET/ROXICET) 5-325 MG per tablet Take 1 tablet by mouth every 6 (six) hours as needed for moderate pain or severe pain.    [provider]  predniSONE (DELTASONE) 10 MG tablet Take 40mg  po daily for 2 days then 30mg  po daily for 2 days then 20mg  po daily for 2 days then 10mg  po daily for 2 days then stop 05/20/13   Kathie Dike, MD  traMADol (ULTRAM) 50 MG tablet Take 1 tablet (50 mg total) by mouth every 6 (six) hours as needed. 02/10/17   Kem Parkinson, PA-C    Family History History reviewed. No pertinent family history.  Social History  Social History   Tobacco Use  . Smoking status: Current Every Day Smoker    Packs/day: 1.00    Types: Cigarettes  . Smokeless tobacco: Never Used  Substance Use Topics  . Alcohol use: No  . Drug use: No     Allergies   Codeine and Eggs or egg-derived products   Review of Systems Review of Systems  Constitutional: Negative for chills and fever.  Musculoskeletal: Positive for arthralgias (Right knee pain) and joint swelling.  Skin: Negative for color change and wound.  Neurological: Negative for weakness, numbness and headaches.  All other systems reviewed and are negative.    Physical  Exam Updated Vital Signs BP (!) 123/51 (BP Location: Right Arm)   Pulse 79   Temp 97.7 F (36.5 C) (Oral)   Resp 16   Ht 5' (1.524 Patty)   Wt 59 kg (130 lb)   SpO2 96%   BMI 25.39 kg/Patty   Physical Exam  Constitutional: She is oriented to person, place, and time. She appears well-developed and well-nourished. No distress.  HENT:  Head: Atraumatic.  Cardiovascular: Normal rate, regular rhythm and intact distal pulses.  Pulmonary/Chest: Effort normal and breath sounds normal.  Musculoskeletal: She exhibits edema and tenderness. She exhibits no deformity.  ttp of the right knee.  Mild to moderate edema.  No erythema, or bony deformity.  Patient has full range of motion of the knee  Neurological: She is alert and oriented to person, place, and time. No sensory deficit. She exhibits normal muscle tone. Coordination normal.  Skin: Skin is warm and dry. No erythema.  Nursing note and vitals reviewed.    ED Treatments / Results  Labs (all labs ordered are listed, but only abnormal results are displayed) Labs Reviewed - No data to display  EKG  EKG Interpretation None       Radiology No results found.  Procedures Procedures (including critical care time)  Medications Ordered in ED Medications - No data to display   Initial Impression / Assessment and Plan / ED Course  I have reviewed the triage vital signs and the nursing notes.  Pertinent labs & imaging results that were available during my care of the patient were reviewed by me and considered in my medical decision making (see chart for details).     Patient with anterior tenderness of the right knee, neurovascularly intact.  Mild edema.  No concerning symptoms for septic joint.  Likely inflammatory.  Knee immobilizer applied, patient agrees to elevate and apply ice.  She prefers to follow-up with local orthopedics.  She appears stable for discharge.    Final Clinical Impressions(s) / ED Diagnoses   Final diagnoses:   Acute pain of right knee    ED Discharge Orders        Ordered    traMADol (ULTRAM) 50 MG tablet  Every 6 hours PRN     02/10/17 1314    naproxen (NAPROSYN) 375 MG tablet  2 times daily with meals     02/10/17 1314       Kem Parkinson, PA-C 02/12/17 2002    Fredia Sorrow, MD 02/16/17 1553

## 2017-02-13 ENCOUNTER — Other Ambulatory Visit: Payer: Self-pay

## 2017-02-14 DIAGNOSIS — C44311 Basal cell carcinoma of skin of nose: Secondary | ICD-10-CM | POA: Diagnosis not present

## 2017-02-14 DIAGNOSIS — Z85828 Personal history of other malignant neoplasm of skin: Secondary | ICD-10-CM | POA: Diagnosis not present

## 2017-02-14 DIAGNOSIS — Z08 Encounter for follow-up examination after completed treatment for malignant neoplasm: Secondary | ICD-10-CM | POA: Diagnosis not present

## 2017-03-07 DIAGNOSIS — F17210 Nicotine dependence, cigarettes, uncomplicated: Secondary | ICD-10-CM | POA: Diagnosis not present

## 2017-03-07 DIAGNOSIS — R062 Wheezing: Secondary | ICD-10-CM | POA: Diagnosis not present

## 2017-03-07 DIAGNOSIS — K5901 Slow transit constipation: Secondary | ICD-10-CM | POA: Diagnosis not present

## 2017-03-07 DIAGNOSIS — J4540 Moderate persistent asthma, uncomplicated: Secondary | ICD-10-CM | POA: Diagnosis not present

## 2017-03-07 DIAGNOSIS — I10 Essential (primary) hypertension: Secondary | ICD-10-CM | POA: Diagnosis not present

## 2017-03-07 DIAGNOSIS — J449 Chronic obstructive pulmonary disease, unspecified: Secondary | ICD-10-CM | POA: Diagnosis not present

## 2017-03-07 DIAGNOSIS — K219 Gastro-esophageal reflux disease without esophagitis: Secondary | ICD-10-CM | POA: Diagnosis not present

## 2017-03-07 DIAGNOSIS — F418 Other specified anxiety disorders: Secondary | ICD-10-CM | POA: Diagnosis not present

## 2017-03-07 DIAGNOSIS — K581 Irritable bowel syndrome with constipation: Secondary | ICD-10-CM | POA: Diagnosis not present

## 2017-03-07 DIAGNOSIS — M5136 Other intervertebral disc degeneration, lumbar region: Secondary | ICD-10-CM | POA: Diagnosis not present

## 2017-03-07 DIAGNOSIS — Z6824 Body mass index (BMI) 24.0-24.9, adult: Secondary | ICD-10-CM | POA: Diagnosis not present

## 2017-03-07 DIAGNOSIS — M9901 Segmental and somatic dysfunction of cervical region: Secondary | ICD-10-CM | POA: Diagnosis not present

## 2017-03-08 DIAGNOSIS — C44311 Basal cell carcinoma of skin of nose: Secondary | ICD-10-CM | POA: Diagnosis not present

## 2017-03-08 DIAGNOSIS — Z85828 Personal history of other malignant neoplasm of skin: Secondary | ICD-10-CM | POA: Diagnosis not present

## 2017-03-08 DIAGNOSIS — Z08 Encounter for follow-up examination after completed treatment for malignant neoplasm: Secondary | ICD-10-CM | POA: Diagnosis not present

## 2017-03-13 DIAGNOSIS — M2041 Other hammer toe(s) (acquired), right foot: Secondary | ICD-10-CM | POA: Diagnosis not present

## 2017-03-13 DIAGNOSIS — M79674 Pain in right toe(s): Secondary | ICD-10-CM | POA: Diagnosis not present

## 2017-03-13 DIAGNOSIS — L84 Corns and callosities: Secondary | ICD-10-CM | POA: Diagnosis not present

## 2017-03-22 DIAGNOSIS — C44319 Basal cell carcinoma of skin of other parts of face: Secondary | ICD-10-CM | POA: Diagnosis not present

## 2017-04-16 DIAGNOSIS — M9901 Segmental and somatic dysfunction of cervical region: Secondary | ICD-10-CM | POA: Diagnosis not present

## 2017-04-16 DIAGNOSIS — J449 Chronic obstructive pulmonary disease, unspecified: Secondary | ICD-10-CM | POA: Diagnosis not present

## 2017-04-16 DIAGNOSIS — F418 Other specified anxiety disorders: Secondary | ICD-10-CM | POA: Diagnosis not present

## 2017-04-16 DIAGNOSIS — J4540 Moderate persistent asthma, uncomplicated: Secondary | ICD-10-CM | POA: Diagnosis not present

## 2017-04-16 DIAGNOSIS — M1711 Unilateral primary osteoarthritis, right knee: Secondary | ICD-10-CM | POA: Diagnosis not present

## 2017-04-16 DIAGNOSIS — I10 Essential (primary) hypertension: Secondary | ICD-10-CM | POA: Diagnosis not present

## 2017-04-16 DIAGNOSIS — M5136 Other intervertebral disc degeneration, lumbar region: Secondary | ICD-10-CM | POA: Diagnosis not present

## 2017-04-16 DIAGNOSIS — K5901 Slow transit constipation: Secondary | ICD-10-CM | POA: Diagnosis not present

## 2017-04-16 DIAGNOSIS — K219 Gastro-esophageal reflux disease without esophagitis: Secondary | ICD-10-CM | POA: Diagnosis not present

## 2017-05-10 DIAGNOSIS — Z08 Encounter for follow-up examination after completed treatment for malignant neoplasm: Secondary | ICD-10-CM | POA: Diagnosis not present

## 2017-05-10 DIAGNOSIS — Z85828 Personal history of other malignant neoplasm of skin: Secondary | ICD-10-CM | POA: Diagnosis not present

## 2017-05-10 DIAGNOSIS — X32XXXA Exposure to sunlight, initial encounter: Secondary | ICD-10-CM | POA: Diagnosis not present

## 2017-05-10 DIAGNOSIS — L570 Actinic keratosis: Secondary | ICD-10-CM | POA: Diagnosis not present

## 2017-05-11 DIAGNOSIS — N390 Urinary tract infection, site not specified: Secondary | ICD-10-CM | POA: Diagnosis not present

## 2017-05-22 DIAGNOSIS — M79674 Pain in right toe(s): Secondary | ICD-10-CM | POA: Diagnosis not present

## 2017-05-22 DIAGNOSIS — M2041 Other hammer toe(s) (acquired), right foot: Secondary | ICD-10-CM | POA: Diagnosis not present

## 2017-06-14 DIAGNOSIS — Z886 Allergy status to analgesic agent status: Secondary | ICD-10-CM | POA: Diagnosis not present

## 2017-06-14 DIAGNOSIS — F329 Major depressive disorder, single episode, unspecified: Secondary | ICD-10-CM | POA: Diagnosis not present

## 2017-06-14 DIAGNOSIS — L603 Nail dystrophy: Secondary | ICD-10-CM | POA: Diagnosis not present

## 2017-06-14 DIAGNOSIS — L602 Onychogryphosis: Secondary | ICD-10-CM | POA: Diagnosis not present

## 2017-06-14 DIAGNOSIS — F17200 Nicotine dependence, unspecified, uncomplicated: Secondary | ICD-10-CM | POA: Diagnosis not present

## 2017-06-14 DIAGNOSIS — M2041 Other hammer toe(s) (acquired), right foot: Secondary | ICD-10-CM | POA: Diagnosis not present

## 2017-06-14 DIAGNOSIS — I10 Essential (primary) hypertension: Secondary | ICD-10-CM | POA: Diagnosis not present

## 2017-06-14 DIAGNOSIS — M79672 Pain in left foot: Secondary | ICD-10-CM | POA: Diagnosis not present

## 2017-06-14 DIAGNOSIS — L600 Ingrowing nail: Secondary | ICD-10-CM | POA: Diagnosis not present

## 2017-06-14 DIAGNOSIS — F419 Anxiety disorder, unspecified: Secondary | ICD-10-CM | POA: Diagnosis not present

## 2017-06-14 DIAGNOSIS — J449 Chronic obstructive pulmonary disease, unspecified: Secondary | ICD-10-CM | POA: Diagnosis not present

## 2017-06-14 DIAGNOSIS — Z85828 Personal history of other malignant neoplasm of skin: Secondary | ICD-10-CM | POA: Diagnosis not present

## 2017-06-14 DIAGNOSIS — Z79899 Other long term (current) drug therapy: Secondary | ICD-10-CM | POA: Diagnosis not present

## 2017-06-14 DIAGNOSIS — L84 Corns and callosities: Secondary | ICD-10-CM | POA: Diagnosis not present

## 2017-06-14 DIAGNOSIS — M1990 Unspecified osteoarthritis, unspecified site: Secondary | ICD-10-CM | POA: Diagnosis not present

## 2017-08-14 DIAGNOSIS — J449 Chronic obstructive pulmonary disease, unspecified: Secondary | ICD-10-CM | POA: Diagnosis not present

## 2017-08-14 DIAGNOSIS — M9901 Segmental and somatic dysfunction of cervical region: Secondary | ICD-10-CM | POA: Diagnosis not present

## 2017-08-14 DIAGNOSIS — K5901 Slow transit constipation: Secondary | ICD-10-CM | POA: Diagnosis not present

## 2017-08-14 DIAGNOSIS — F17210 Nicotine dependence, cigarettes, uncomplicated: Secondary | ICD-10-CM | POA: Diagnosis not present

## 2017-08-14 DIAGNOSIS — I10 Essential (primary) hypertension: Secondary | ICD-10-CM | POA: Diagnosis not present

## 2017-08-14 DIAGNOSIS — M5136 Other intervertebral disc degeneration, lumbar region: Secondary | ICD-10-CM | POA: Diagnosis not present

## 2017-08-14 DIAGNOSIS — R109 Unspecified abdominal pain: Secondary | ICD-10-CM | POA: Diagnosis not present

## 2017-08-14 DIAGNOSIS — K219 Gastro-esophageal reflux disease without esophagitis: Secondary | ICD-10-CM | POA: Diagnosis not present

## 2017-08-14 DIAGNOSIS — M2540 Effusion, unspecified joint: Secondary | ICD-10-CM | POA: Diagnosis not present

## 2017-08-14 DIAGNOSIS — M1711 Unilateral primary osteoarthritis, right knee: Secondary | ICD-10-CM | POA: Diagnosis not present

## 2017-08-15 ENCOUNTER — Other Ambulatory Visit (HOSPITAL_COMMUNITY): Payer: Self-pay | Admitting: *Deleted

## 2017-08-15 DIAGNOSIS — R109 Unspecified abdominal pain: Secondary | ICD-10-CM

## 2017-08-15 DIAGNOSIS — I10 Essential (primary) hypertension: Secondary | ICD-10-CM

## 2017-08-17 ENCOUNTER — Ambulatory Visit (HOSPITAL_COMMUNITY)
Admission: RE | Admit: 2017-08-17 | Discharge: 2017-08-17 | Disposition: A | Discharge status: Home / Self Care | Payer: BLUE CROSS/BLUE SHIELD | Source: Ambulatory Visit | Attending: *Deleted | Admitting: *Deleted

## 2017-08-17 DIAGNOSIS — I10 Essential (primary) hypertension: Secondary | ICD-10-CM | POA: Diagnosis not present

## 2017-08-17 DIAGNOSIS — R109 Unspecified abdominal pain: Secondary | ICD-10-CM | POA: Diagnosis not present

## 2017-08-22 ENCOUNTER — Ambulatory Visit (INDEPENDENT_AMBULATORY_CARE_PROVIDER_SITE_OTHER): Payer: BLUE CROSS/BLUE SHIELD

## 2017-08-22 ENCOUNTER — Encounter: Payer: Self-pay | Admitting: Orthopaedic Surgery

## 2017-08-22 ENCOUNTER — Ambulatory Visit (INDEPENDENT_AMBULATORY_CARE_PROVIDER_SITE_OTHER): Payer: BLUE CROSS/BLUE SHIELD | Admitting: Orthopaedic Surgery

## 2017-08-22 VITALS — BP 120/60 | HR 95 | Ht 61.5 in | Wt 132.0 lb

## 2017-08-22 DIAGNOSIS — F17210 Nicotine dependence, cigarettes, uncomplicated: Secondary | ICD-10-CM | POA: Diagnosis not present

## 2017-08-22 DIAGNOSIS — M25561 Pain in right knee: Secondary | ICD-10-CM | POA: Diagnosis not present

## 2017-08-22 MED ORDER — NAPROXEN 500 MG PO TABS: 500.0000 mg | ORAL_TABLET | Freq: Two times a day (BID) | ORAL | 5 refills | Status: DC

## 2017-08-22 NOTE — Progress Notes (Signed)
Dg kne

## 2017-08-22 NOTE — Progress Notes (Signed)
Subjective:    Patient ID: [name], female    DOB: October 23, 1950, 67 y.o.   MRN: [id number]  HPI She hurt her knee at work on 08-11-17.  She was pushing a buggy and turned.  She felt a pop in the right knee.  She had pain.  She told her supervisor.  It was agreed not to report this as a Worker's Comp injury.  She is filing under her own insurance.  She saw Dr. Melina Copa in her office on 08-15-17.  She was advised to see orthopedist.  The patient has pain medially and some swelling of the right knee with no giving way, no redness, no numbness.  She has taken Advil which helped as well as ice and heat. She has a history of arthritis of the knee.   Review of Systems  Constitutional: Positive for activity change.  Respiratory: Positive for shortness of breath. Negative for cough.   Cardiovascular: Negative for chest pain and leg swelling.  Endocrine: Positive for cold intolerance.  Musculoskeletal: Positive for arthralgias, gait problem and joint swelling.  Allergic/Immunologic: Positive for environmental allergies.  Psychiatric/Behavioral: The patient is nervous/anxious.   All other systems reviewed and are negative.  Past Medical History:  Diagnosis Date  . Anxiety   . Asthma   . COPD (chronic obstructive pulmonary disease) ([place])   . Hypertension     Past Surgical History:  Procedure Laterality Date  . ABDOMINAL HYSTERECTOMY    . BREAST SURGERY     left lumpectomy  . HERNIA REPAIR    . TONSILLECTOMY      Current Outpatient Medications on File Prior to Visit  Medication Sig Dispense Refill  . albuterol (PROVENTIL HFA;VENTOLIN HFA) 108 (90 BASE) MCG/ACT inhaler Inhale 2 puffs into the lungs every 6 (six) hours as needed for wheezing or shortness of breath.    Marland Kitchen albuterol-ipratropium (COMBIVENT) 18-103 MCG/ACT inhaler Inhale 1 puff into the lungs 4 (four) times daily.    Marland Kitchen ALPRAZolam (XANAX) 0.5 MG tablet Take 0.5 mg by mouth 3 (three) times daily.    . cyclobenzaprine  (FLEXERIL) 10 MG tablet Take 10 mg by mouth 3 (three) times daily as needed for muscle spasms.    . formoterol (FORADIL) 12 MCG capsule for inhaler Place 12 mcg into inhaler and inhale every 12 (twelve) hours.    Marland Kitchen lisinopril-hydrochlorothiazide (PRINZIDE,ZESTORETIC) 10-12.5 MG per tablet Take 1 tablet by mouth daily.    Marland Kitchen oxyCODONE-acetaminophen (PERCOCET) 10-325 MG tablet Take 1 tablet by mouth every 4 (four) hours as needed for pain.    . ranitidine (ZANTAC) 300 MG tablet Take 300 mg by mouth at bedtime.    Marland Kitchen tiotropium (SPIRIVA) 18 MCG inhalation capsule Place 18 mcg into inhaler and inhale daily.    Marland Kitchen guaiFENesin (MUCINEX) 600 MG 12 hr tablet Take 1 tablet (600 mg total) by mouth 2 (two) times daily. (Patient not taking: Reported on 08/22/2017) 14 tablet 0  . ipratropium-albuterol (DUONEB) 0.5-2.5 (3) MG/3ML SOLN Take 3 mLs by nebulization every 6 (six) hours as needed (for shortness of breath/wheezing).    Marland Kitchen levofloxacin (LEVAQUIN) 500 MG tablet Take 1 tablet (500 mg total) by mouth daily. 4 tablet 0  . metoprolol succinate (TOPROL-XL) 50 MG 24 hr tablet Take 50 mg by mouth daily. Take with or immediately following a meal.    . oxyCODONE-acetaminophen (PERCOCET/ROXICET) 5-325 MG per tablet Take 1 tablet by mouth every 6 (six) hours as needed for moderate pain or severe pain.    Marland Kitchen  predniSONE (DELTASONE) 10 MG tablet Take 40mg  po daily for 2 days then 30mg  po daily for 2 days then 20mg  po daily for 2 days then 10mg  po daily for 2 days then stop (Patient not taking: Reported on 08/22/2017) 20 tablet 0  . traMADol (ULTRAM) 50 MG tablet Take 1 tablet (50 mg total) by mouth every 6 (six) hours as needed. (Patient not taking: Reported on 08/22/2017) 15 tablet 0   No current facility-administered medications on file prior to visit.     Social History   Socioeconomic History  . Marital status: Divorced    Spouse name: Not on file  . Number of children: Not on file  . Years of education: Not on file    . Highest education level: Not on file  Occupational History  . Not on file  Social Needs  . Financial resource strain: Not on file  . Food insecurity:    Worry: Not on file    Inability: Not on file  . Transportation needs:    Medical: Not on file    Non-medical: Not on file  Tobacco Use  . Smoking status: Current Every Day Smoker    Packs/day: 1.00    Types: Cigarettes  . Smokeless tobacco: Never Used  Substance and Sexual Activity  . Alcohol use: No  . Drug use: No  . Sexual activity: Not on file  Lifestyle  . Physical activity:    Days per week: Not on file    Minutes per session: Not on file  . Stress: Not on file  Relationships  . Social connections:    Talks on phone: Not on file    Gets together: Not on file    Attends religious service: Not on file    Active member of club or organization: Not on file    Attends meetings of clubs or organizations: Not on file    Relationship status: Not on file  . Intimate partner violence:    Fear of current or ex partner: Not on file    Emotionally abused: Not on file    Physically abused: Not on file    Forced sexual activity: Not on file  Other Topics Concern  . Not on file  Social History Narrative  . Not on file    History reviewed. No pertinent family history.  BP 120/60   Pulse 95   Ht 5' 1.5" (1.562 m)   Wt 132 lb (59.9 kg)   BMI 24.54 kg/m      Objective:   Physical Exam  Constitutional: She is oriented to person, place, and time. She appears well-developed and well-nourished.  HENT:  Head: Normocephalic and atraumatic.  Eyes: Pupils are equal, round, and reactive to light. Conjunctivae and EOM are normal.  Neck: Normal range of motion. Neck supple.  Cardiovascular: Normal rate, regular rhythm and intact distal pulses.  Pulmonary/Chest: Effort normal.  Abdominal: Soft.  Musculoskeletal:       Right knee: She exhibits decreased range of motion and swelling. Tenderness found. Medial joint line  tenderness noted.       Legs: Neurological: She is alert and oriented to person, place, and time. She has normal reflexes. She displays normal reflexes. No cranial nerve deficit. She exhibits normal muscle tone. Coordination normal.  Skin: Skin is warm and dry.  Psychiatric: She has a normal mood and affect. Her behavior is normal. Judgment and thought content normal.     X-rays were done of the right knee, reported separately.  Assessment & Plan:   Encounter Diagnoses  Name Primary?  . Acute pain of right knee Yes  . Cigarette nicotine dependence without complication    PROCEDURE NOTE:  The patient requests injections of the right knee , verbal consent was obtained.  The right knee was prepped appropriately after time out was performed.   Sterile technique was observed and injection of 1 cc of Depo-Medrol 40 mg with several cc's of plain xylocaine. Anesthesia was provided by ethyl chloride and a 20-gauge needle was used to inject the knee area. The injection was tolerated well.  A band aid dressing was applied.  The patient was advised to apply ice later today and tomorrow to the injection sight as needed.  She has a strain of the medial collateral ligament of the right knee.    I will give Rx for Naprosyn 500 po bid.  Precautions given.  Return in two weeks.  Call if any problem.  Precautions discussed.   Electronically Signed Sanjuana Kava, MD 6/26/201910:17 AM

## 2017-08-22 NOTE — Patient Instructions (Signed)

## 2017-09-05 ENCOUNTER — Encounter: Payer: Self-pay | Admitting: Orthopaedic Surgery

## 2017-09-05 ENCOUNTER — Ambulatory Visit (INDEPENDENT_AMBULATORY_CARE_PROVIDER_SITE_OTHER): Payer: BLUE CROSS/BLUE SHIELD | Admitting: Orthopaedic Surgery

## 2017-09-05 VITALS — BP 115/54 | HR 87 | Temp 98.6°F | Ht 60.0 in | Wt 131.8 lb

## 2017-09-05 DIAGNOSIS — M25561 Pain in right knee: Secondary | ICD-10-CM | POA: Diagnosis not present

## 2017-09-05 DIAGNOSIS — F17210 Nicotine dependence, cigarettes, uncomplicated: Secondary | ICD-10-CM | POA: Diagnosis not present

## 2017-09-05 NOTE — Progress Notes (Signed)
Patient [name], female DOB:Jun 13, 1950, 67 y.o. BOF:[id number]  Chief Complaint  Patient presents with  . Follow-up    right knee    HPI  [name] is a 67 y.o. female who continues to have right knee pain.  She has less pain since the injection but still has swelling and popping.  She has tendency to give way now.  She has no new trauma.  I feel she may need MRI.  She has to wait 6 weeks from first visit to get MRI.  She saw Dr. Melina Copa 08-15-17.   Body mass index is 25.74 kg/m.  ROS  Review of Systems  Constitutional: Positive for activity change.  Respiratory: Positive for shortness of breath. Negative for cough.   Cardiovascular: Negative for chest pain and leg swelling.  Endocrine: Positive for cold intolerance.  Musculoskeletal: Positive for arthralgias, gait problem and joint swelling.  Allergic/Immunologic: Positive for environmental allergies.  Psychiatric/Behavioral: The patient is nervous/anxious.   All other systems reviewed and are negative.   All other systems reviewed and are negative.  Past Medical History:  Diagnosis Date  . Anxiety   . Asthma   . COPD (chronic obstructive pulmonary disease) ([place])   . Hypertension     Past Surgical History:  Procedure Laterality Date  . ABDOMINAL HYSTERECTOMY    . BREAST SURGERY     left lumpectomy  . HERNIA REPAIR    . TONSILLECTOMY      History reviewed. No pertinent family history.  Social History Social History   Tobacco Use  . Smoking status: Current Every Day Smoker    Packs/day: 1.00    Types: Cigarettes  . Smokeless tobacco: Never Used  Substance Use Topics  . Alcohol use: No  . Drug use: No    Allergies  Allergen Reactions  . Codeine Nausea And Vomiting  . Eggs Or Egg-Derived Products     Current Outpatient Medications  Medication Sig Dispense Refill  . albuterol (PROVENTIL HFA;VENTOLIN HFA) 108 (90 BASE) MCG/ACT inhaler Inhale 2 puffs into the lungs every 6 (six) hours  as needed for wheezing or shortness of breath.    Marland Kitchen albuterol-ipratropium (COMBIVENT) 18-103 MCG/ACT inhaler Inhale 1 puff into the lungs 4 (four) times daily.    Marland Kitchen ALPRAZolam (XANAX) 0.5 MG tablet Take 0.5 mg by mouth 3 (three) times daily.    . cyclobenzaprine (FLEXERIL) 10 MG tablet Take 10 mg by mouth 3 (three) times daily as needed for muscle spasms.    . formoterol (FORADIL) 12 MCG capsule for inhaler Place 12 mcg into inhaler and inhale every 12 (twelve) hours.    Marland Kitchen guaiFENesin (MUCINEX) 600 MG 12 hr tablet Take 1 tablet (600 mg total) by mouth 2 (two) times daily. 14 tablet 0  . ipratropium-albuterol (DUONEB) 0.5-2.5 (3) MG/3ML SOLN Take 3 mLs by nebulization every 6 (six) hours as needed (for shortness of breath/wheezing).    Marland Kitchen levofloxacin (LEVAQUIN) 500 MG tablet Take 1 tablet (500 mg total) by mouth daily. 4 tablet 0  . lisinopril-hydrochlorothiazide (PRINZIDE,ZESTORETIC) 10-12.5 MG per tablet Take 1 tablet by mouth daily.    . metoprolol succinate (TOPROL-XL) 50 MG 24 hr tablet Take 50 mg by mouth daily. Take with or immediately following a meal.    . naproxen (NAPROSYN) 500 MG tablet Take 1 tablet (500 mg total) by mouth 2 (two) times daily with a meal. 60 tablet 5  . oxyCODONE-acetaminophen (PERCOCET) 10-325 MG tablet Take 1 tablet by mouth every 4 (four) hours  as needed for pain.    Marland Kitchen oxyCODONE-acetaminophen (PERCOCET/ROXICET) 5-325 MG per tablet Take 1 tablet by mouth every 6 (six) hours as needed for moderate pain or severe pain.    . predniSONE (DELTASONE) 10 MG tablet Take 40mg  po daily for 2 days then 30mg  po daily for 2 days then 20mg  po daily for 2 days then 10mg  po daily for 2 days then stop 20 tablet 0  . ranitidine (ZANTAC) 300 MG tablet Take 300 mg by mouth at bedtime.    Marland Kitchen tiotropium (SPIRIVA) 18 MCG inhalation capsule Place 18 mcg into inhaler and inhale daily.    . traMADol (ULTRAM) 50 MG tablet Take 1 tablet (50 mg total) by mouth every 6 (six) hours as needed. 15  tablet 0  . BEVESPI AEROSPHERE 9-4.8 MCG/ACT AERO Inhale 2 puffs into the lungs 2 (two) times daily.  6  . HYDROcodone-acetaminophen (NORCO/VICODIN) 5-325 MG tablet   0  . ranitidine (ZANTAC) 300 MG capsule TAKE 1 CAPSULE BY MOUTH ONCE DAILY AT BEDTIME  11   No current facility-administered medications for this visit.      Physical Exam  Blood pressure (!) 115/54, pulse 87, temperature 98.6 F (37 C), height 5' (1.524 m), weight 131 lb 12.8 oz (59.8 kg).  Constitutional: overall normal hygiene, normal nutrition, well developed, normal grooming, normal body habitus. Assistive device:none  Musculoskeletal: gait and station Limp right, muscle tone and strength are normal, no tremors or atrophy is present.  .  Neurological: coordination overall normal.  Deep tendon reflex/nerve stretch intact.  Sensation normal.  Cranial nerves II-XII intact.   Skin:   Normal overall no scars, lesions, ulcers or rashes. No psoriasis.  Psychiatric: Alert and oriented x 3.  Recent memory intact, remote memory unclear.  Normal mood and affect. Well groomed.  Good eye contact.  Cardiovascular: overall no swelling, no varicosities, no edema bilaterally, normal temperatures of the legs and arms, no clubbing, cyanosis and good capillary refill.  Lymphatic: palpation is normal.  Right knee has effusion 1+, pain medially, positive medial McMurray, ROM 0 to 110, limp to the right, crepitus.  All other systems reviewed and are negative   The patient has been educated about the nature of the problem(s) and counseled on treatment options.  The patient appeared to understand what I have discussed and is in agreement with it.  Encounter Diagnoses  Name Primary?  . Acute pain of right knee Yes  . Cigarette nicotine dependence without complication     PLAN Call if any problems.  Precautions discussed.  Continue current medications.   Return to clinic 3 weeks   Out of work Midwife.  Consider MRI on return  if not improved.  Electronically Signed Sanjuana Kava, MD 7/10/20199:20 AM

## 2017-09-05 NOTE — Patient Instructions (Signed)
Out of work Midwife

## 2017-09-26 ENCOUNTER — Ambulatory Visit: Payer: BLUE CROSS/BLUE SHIELD | Admitting: Orthopaedic Surgery

## 2017-10-09 DIAGNOSIS — B351 Tinea unguium: Secondary | ICD-10-CM | POA: Diagnosis not present

## 2017-10-09 DIAGNOSIS — M79676 Pain in unspecified toe(s): Secondary | ICD-10-CM | POA: Diagnosis not present

## 2017-10-09 DIAGNOSIS — L84 Corns and callosities: Secondary | ICD-10-CM | POA: Diagnosis not present

## 2017-11-26 DIAGNOSIS — M549 Dorsalgia, unspecified: Secondary | ICD-10-CM | POA: Diagnosis not present

## 2017-11-26 DIAGNOSIS — M9902 Segmental and somatic dysfunction of thoracic region: Secondary | ICD-10-CM | POA: Diagnosis not present

## 2017-11-26 DIAGNOSIS — I10 Essential (primary) hypertension: Secondary | ICD-10-CM | POA: Diagnosis not present

## 2017-11-26 DIAGNOSIS — F418 Other specified anxiety disorders: Secondary | ICD-10-CM | POA: Diagnosis not present

## 2017-11-26 DIAGNOSIS — K219 Gastro-esophageal reflux disease without esophagitis: Secondary | ICD-10-CM | POA: Diagnosis not present

## 2017-11-26 DIAGNOSIS — M5136 Other intervertebral disc degeneration, lumbar region: Secondary | ICD-10-CM | POA: Diagnosis not present

## 2017-11-26 DIAGNOSIS — M9901 Segmental and somatic dysfunction of cervical region: Secondary | ICD-10-CM | POA: Diagnosis not present

## 2017-11-26 DIAGNOSIS — J449 Chronic obstructive pulmonary disease, unspecified: Secondary | ICD-10-CM | POA: Diagnosis not present

## 2017-11-26 DIAGNOSIS — M9903 Segmental and somatic dysfunction of lumbar region: Secondary | ICD-10-CM | POA: Diagnosis not present

## 2017-12-18 DIAGNOSIS — B351 Tinea unguium: Secondary | ICD-10-CM | POA: Diagnosis not present

## 2017-12-18 DIAGNOSIS — M79676 Pain in unspecified toe(s): Secondary | ICD-10-CM | POA: Diagnosis not present

## 2018-05-29 DIAGNOSIS — M9901 Segmental and somatic dysfunction of cervical region: Secondary | ICD-10-CM | POA: Diagnosis not present

## 2018-05-29 DIAGNOSIS — J029 Acute pharyngitis, unspecified: Secondary | ICD-10-CM | POA: Diagnosis not present

## 2018-06-19 DIAGNOSIS — J029 Acute pharyngitis, unspecified: Secondary | ICD-10-CM | POA: Diagnosis not present

## 2018-07-02 DIAGNOSIS — R300 Dysuria: Secondary | ICD-10-CM | POA: Diagnosis not present

## 2018-07-02 DIAGNOSIS — M549 Dorsalgia, unspecified: Secondary | ICD-10-CM | POA: Diagnosis not present

## 2018-07-02 DIAGNOSIS — R350 Frequency of micturition: Secondary | ICD-10-CM | POA: Diagnosis not present

## 2018-10-10 DIAGNOSIS — M79676 Pain in unspecified toe(s): Secondary | ICD-10-CM | POA: Diagnosis not present

## 2018-10-10 DIAGNOSIS — B351 Tinea unguium: Secondary | ICD-10-CM | POA: Diagnosis not present

## 2019-01-16 DIAGNOSIS — I10 Essential (primary) hypertension: Secondary | ICD-10-CM | POA: Diagnosis not present

## 2019-01-16 DIAGNOSIS — F418 Other specified anxiety disorders: Secondary | ICD-10-CM | POA: Diagnosis not present

## 2019-01-16 DIAGNOSIS — K219 Gastro-esophageal reflux disease without esophagitis: Secondary | ICD-10-CM | POA: Diagnosis not present

## 2019-01-22 DIAGNOSIS — Z1211 Encounter for screening for malignant neoplasm of colon: Secondary | ICD-10-CM | POA: Diagnosis not present

## 2019-01-29 ENCOUNTER — Other Ambulatory Visit: Payer: Self-pay | Admitting: *Deleted

## 2019-04-21 DIAGNOSIS — K219 Gastro-esophageal reflux disease without esophagitis: Secondary | ICD-10-CM | POA: Diagnosis not present

## 2019-04-21 DIAGNOSIS — J4540 Moderate persistent asthma, uncomplicated: Secondary | ICD-10-CM | POA: Diagnosis not present

## 2019-04-21 DIAGNOSIS — I10 Essential (primary) hypertension: Secondary | ICD-10-CM | POA: Diagnosis not present

## 2019-04-21 DIAGNOSIS — F17210 Nicotine dependence, cigarettes, uncomplicated: Secondary | ICD-10-CM | POA: Diagnosis not present

## 2019-04-21 DIAGNOSIS — E559 Vitamin D deficiency, unspecified: Secondary | ICD-10-CM | POA: Diagnosis not present

## 2019-04-21 DIAGNOSIS — F418 Other specified anxiety disorders: Secondary | ICD-10-CM | POA: Diagnosis not present

## 2019-06-18 DIAGNOSIS — K219 Gastro-esophageal reflux disease without esophagitis: Secondary | ICD-10-CM | POA: Diagnosis not present

## 2019-06-18 DIAGNOSIS — E559 Vitamin D deficiency, unspecified: Secondary | ICD-10-CM | POA: Diagnosis not present

## 2019-06-18 DIAGNOSIS — I10 Essential (primary) hypertension: Secondary | ICD-10-CM | POA: Diagnosis not present

## 2019-06-18 DIAGNOSIS — J4540 Moderate persistent asthma, uncomplicated: Secondary | ICD-10-CM | POA: Diagnosis not present

## 2019-06-18 DIAGNOSIS — F17210 Nicotine dependence, cigarettes, uncomplicated: Secondary | ICD-10-CM | POA: Diagnosis not present

## 2019-06-18 DIAGNOSIS — M5136 Other intervertebral disc degeneration, lumbar region: Secondary | ICD-10-CM | POA: Diagnosis not present

## 2019-06-18 DIAGNOSIS — F418 Other specified anxiety disorders: Secondary | ICD-10-CM | POA: Diagnosis not present

## 2019-06-26 DIAGNOSIS — Z23 Encounter for immunization: Secondary | ICD-10-CM | POA: Diagnosis not present

## 2019-07-02 DIAGNOSIS — R922 Inconclusive mammogram: Secondary | ICD-10-CM | POA: Diagnosis not present

## 2019-07-02 DIAGNOSIS — R921 Mammographic calcification found on diagnostic imaging of breast: Secondary | ICD-10-CM | POA: Diagnosis not present

## 2019-07-02 DIAGNOSIS — N6489 Other specified disorders of breast: Secondary | ICD-10-CM | POA: Diagnosis not present

## 2019-07-24 DIAGNOSIS — Z23 Encounter for immunization: Secondary | ICD-10-CM | POA: Diagnosis not present

## 2019-08-25 DIAGNOSIS — R069 Unspecified abnormalities of breathing: Secondary | ICD-10-CM | POA: Diagnosis not present

## 2019-08-25 DIAGNOSIS — N390 Urinary tract infection, site not specified: Secondary | ICD-10-CM | POA: Diagnosis not present

## 2019-08-25 DIAGNOSIS — R300 Dysuria: Secondary | ICD-10-CM | POA: Diagnosis not present

## 2019-09-01 ENCOUNTER — Other Ambulatory Visit: Payer: Self-pay

## 2019-09-01 ENCOUNTER — Emergency Department (HOSPITAL_COMMUNITY)
Admission: EM | Admit: 2019-09-01 | Discharge: 2019-09-01 | Disposition: A | Discharge status: Home / Self Care | Payer: PPO | Attending: Emergency Medicine | Admitting: Emergency Medicine

## 2019-09-01 ENCOUNTER — Emergency Department (HOSPITAL_COMMUNITY): Payer: PPO

## 2019-09-01 ENCOUNTER — Encounter (HOSPITAL_COMMUNITY): Payer: Self-pay | Admitting: *Deleted

## 2019-09-01 DIAGNOSIS — R1031 Right lower quadrant pain: Secondary | ICD-10-CM | POA: Diagnosis not present

## 2019-09-01 DIAGNOSIS — F17210 Nicotine dependence, cigarettes, uncomplicated: Secondary | ICD-10-CM | POA: Diagnosis not present

## 2019-09-01 DIAGNOSIS — I10 Essential (primary) hypertension: Secondary | ICD-10-CM | POA: Insufficient documentation

## 2019-09-01 DIAGNOSIS — N12 Tubulo-interstitial nephritis, not specified as acute or chronic: Secondary | ICD-10-CM

## 2019-09-01 DIAGNOSIS — J45909 Unspecified asthma, uncomplicated: Secondary | ICD-10-CM | POA: Insufficient documentation

## 2019-09-01 DIAGNOSIS — M47816 Spondylosis without myelopathy or radiculopathy, lumbar region: Secondary | ICD-10-CM | POA: Diagnosis not present

## 2019-09-01 DIAGNOSIS — M4316 Spondylolisthesis, lumbar region: Secondary | ICD-10-CM | POA: Diagnosis not present

## 2019-09-01 DIAGNOSIS — M4186 Other forms of scoliosis, lumbar region: Secondary | ICD-10-CM | POA: Diagnosis not present

## 2019-09-01 DIAGNOSIS — J449 Chronic obstructive pulmonary disease, unspecified: Secondary | ICD-10-CM | POA: Diagnosis not present

## 2019-09-01 DIAGNOSIS — N10 Acute tubulo-interstitial nephritis: Secondary | ICD-10-CM | POA: Insufficient documentation

## 2019-09-01 LAB — COMPREHENSIVE METABOLIC PANEL
ALT: 17 U/L (ref 0–44)
AST: 15 U/L (ref 15–41)
Albumin: 4 g/dL (ref 3.5–5.0)
Alkaline Phosphatase: 96 U/L (ref 38–126)
Anion gap: 9 (ref 5–15)
BUN: 19 mg/dL (ref 8–23)
CO2: 29 mmol/L (ref 22–32)
Calcium: 8.7 mg/dL — ABNORMAL LOW (ref 8.9–10.3)
Chloride: 100 mmol/L (ref 98–111)
Creatinine, Ser: 0.9 mg/dL (ref 0.44–1.00)
GFR calc Af Amer: >60 mL/min (ref >60)
GFR calc non Af Amer: >60 mL/min (ref >60)
Glucose, Bld: 101 mg/dL — ABNORMAL HIGH (ref 70–99)
Potassium: 4.4 mmol/L (ref 3.5–5.1)
Sodium: 138 mmol/L (ref 135–145)
Total Bilirubin: 0.5 mg/dL (ref 0.3–1.2)
Total Protein: 7.2 g/dL (ref 6.5–8.1)

## 2019-09-01 LAB — CBC
HCT: 40.9 % (ref 36.0–46.0)
Hemoglobin: 13.3 g/dL (ref 12.0–15.0)
MCH: 29.1 pg (ref 26.0–34.0)
MCHC: 32.5 g/dL (ref 30.0–36.0)
MCV: 89.5 fL (ref 80.0–100.0)
Platelets: 261 10*3/uL (ref 150–400)
RBC: 4.57 MIL/uL (ref 3.87–5.11)
RDW: 13 % (ref 11.5–15.5)
WBC: 7.4 10*3/uL (ref 4.0–10.5)
nRBC: 0 % (ref 0.0–0.2)

## 2019-09-01 LAB — URINALYSIS, ROUTINE W REFLEX MICROSCOPIC
Bilirubin Urine: NEGATIVE
Glucose, UA: NEGATIVE mg/dL
Ketones, ur: NEGATIVE mg/dL
Nitrite: NEGATIVE
Protein, ur: NEGATIVE mg/dL
Specific Gravity, Urine: 1.017 (ref 1.005–1.030)
pH: 6 (ref 5.0–8.0)

## 2019-09-01 LAB — LIPASE, BLOOD: Lipase: 27 U/L (ref 11–51)

## 2019-09-01 MED ORDER — SODIUM CHLORIDE 0.9 % IV SOLN
1.0000 g | Freq: Once | INTRAVENOUS | Status: AC
  Administered 2019-09-01: 1 g via INTRAVENOUS
  Filled 2019-09-01: qty 10

## 2019-09-01 MED ORDER — CEPHALEXIN 500 MG PO CAPS: 500.0000 mg | ORAL_CAPSULE | Freq: Four times a day (QID) | ORAL | 0 refills | Status: DC

## 2019-09-01 MED ORDER — ONDANSETRON 4 MG PO TBDP: 4.0000 mg | ORAL_TABLET | Freq: Three times a day (TID) | ORAL | 1 refills | Status: DC | PRN

## 2019-09-01 MED ORDER — SODIUM CHLORIDE 0.9% FLUSH
3.0000 mL | Freq: Once | INTRAVENOUS | Status: AC
  Administered 2019-09-01: 3 mL via INTRAVENOUS

## 2019-09-01 MED ORDER — IOHEXOL 300 MG/ML  SOLN
100.0000 mL | Freq: Once | INTRAMUSCULAR | Status: AC | PRN
  Administered 2019-09-01: 100 mL via INTRAVENOUS

## 2019-09-01 MED ORDER — ONDANSETRON HCL 4 MG/2ML IJ SOLN
4.0000 mg | Freq: Once | INTRAMUSCULAR | Status: AC
  Administered 2019-09-01: 4 mg via INTRAVENOUS
  Filled 2019-09-01: qty 2

## 2019-09-01 MED ORDER — SODIUM CHLORIDE 0.9 % IV SOLN: INTRAVENOUS | Status: DC

## 2019-09-01 NOTE — Discharge Instructions (Addendum)
Take the Zofran as needed for nausea.  Start taking the antibiotic Keflex tomorrow.  Would expect improvement from the kidney infection over the next couple days.  Return for any new or worse symptoms.  Return if you have persistent vomiting and cannot keep the antibiotic down.  Urine culture sent today.  Make an appointment to follow-up with your doctor.

## 2019-09-01 NOTE — ED Provider Notes (Signed)
[facility] Provider Note   CSN: [id number] Arrival date & time: 09/01/19  1320     History Chief Complaint  Patient presents with  . Abdominal Pain    [name] is a 69 y.o. female.  Patient treated for urinary tract infection last week.  Was given a prescription for Macrobid.  Patient feels as if she still has urinary tract symptoms.  Past medical history otherwise significant for COPD and hypertension.  Patient states nausea but no vomiting.  Denies any fevers.  Does have right-sided abdominal pain.        Past Medical History:  Diagnosis Date  . Anxiety   . Asthma   . COPD (chronic obstructive pulmonary disease) ([place])   . Hypertension     Patient Active Problem List   Diagnosis Date Noted  . Acute respiratory failure ([place]) 05/19/2013  . Acute bronchitis 05/19/2013  . HTN (hypertension) 05/19/2013    Past Surgical History:  Procedure Laterality Date  . ABDOMINAL HYSTERECTOMY    . BREAST SURGERY     left lumpectomy  . HERNIA REPAIR    . TONSILLECTOMY       OB History   No obstetric history on file.     No family history on file.  Social History   Tobacco Use  . Smoking status: Current Every Day Smoker    Packs/day: 1.00    Types: Cigarettes  . Smokeless tobacco: Never Used  Substance Use Topics  . Alcohol use: No  . Drug use: No    Home Medications Prior to Admission medications   Medication Sig Start Date End Date Taking? Authorizing Provider  albuterol (PROVENTIL HFA;VENTOLIN HFA) 108 (90 BASE) MCG/ACT inhaler Inhale 2 puffs into the lungs every 6 (six) hours as needed for wheezing or shortness of breath.    [provider]  albuterol-ipratropium (COMBIVENT) 18-103 MCG/ACT inhaler Inhale 1 puff into the lungs 4 (four) times daily.    [provider]  ALPRAZolam Duanne Moron) 0.5 MG tablet Take 0.5 mg by mouth 3 (three) times daily.    [provider]  BEVESPI AEROSPHERE 9-4.8 MCG/ACT AERO  Inhale 2 puffs into the lungs 2 (two) times daily. 08/20/17   [provider]  cephALEXin (KEFLEX) 500 MG capsule Take 1 capsule (500 mg total) by mouth 4 (four) times daily. 09/01/19   Fredia Sorrow, MD  cyclobenzaprine (FLEXERIL) 10 MG tablet Take 10 mg by mouth 3 (three) times daily as needed for muscle spasms.    [provider]  formoterol (FORADIL) 12 MCG capsule for inhaler Place 12 mcg into inhaler and inhale every 12 (twelve) hours.    [provider]  guaiFENesin (MUCINEX) 600 MG 12 hr tablet Take 1 tablet (600 mg total) by mouth 2 (two) times daily. 05/20/13   Kathie Dike, MD  HYDROcodone-acetaminophen (NORCO/VICODIN) 5-325 MG tablet  06/14/17   [provider]  ipratropium-albuterol (DUONEB) 0.5-2.5 (3) MG/3ML SOLN Take 3 mLs by nebulization every 6 (six) hours as needed (for shortness of breath/wheezing).    [provider]  levofloxacin (LEVAQUIN) 500 MG tablet Take 1 tablet (500 mg total) by mouth daily. 05/20/13   Kathie Dike, MD  lisinopril-hydrochlorothiazide (PRINZIDE,ZESTORETIC) 10-12.5 MG per tablet Take 1 tablet by mouth daily.    [provider]  metoprolol succinate (TOPROL-XL) 50 MG 24 hr tablet Take 50 mg by mouth daily. Take with or immediately following a meal.    [provider]  naproxen (NAPROSYN) 500 MG tablet  Take 1 tablet (500 mg total) by mouth 2 (two) times daily with a meal. 08/22/17   Sanjuana Kava, MD  ondansetron (ZOFRAN ODT) 4 MG disintegrating tablet Take 1 tablet (4 mg total) by mouth every 8 (eight) hours as needed. 09/01/19   Fredia Sorrow, MD  oxyCODONE-acetaminophen (PERCOCET) 10-325 MG tablet Take 1 tablet by mouth every 4 (four) hours as needed for pain.    [provider]  oxyCODONE-acetaminophen (PERCOCET/ROXICET) 5-325 MG per tablet Take 1 tablet by mouth every 6 (six) hours as needed for moderate pain or severe pain.    [provider]  predniSONE (DELTASONE) 10 MG  tablet Take 40mg  po daily for 2 days then 30mg  po daily for 2 days then 20mg  po daily for 2 days then 10mg  po daily for 2 days then stop 05/20/13   Kathie Dike, MD  ranitidine (ZANTAC) 300 MG capsule TAKE 1 CAPSULE BY MOUTH ONCE DAILY AT BEDTIME 08/06/17   [provider]  ranitidine (ZANTAC) 300 MG tablet Take 300 mg by mouth at bedtime.    [provider]  tiotropium (SPIRIVA) 18 MCG inhalation capsule Place 18 mcg into inhaler and inhale daily.    [provider]  traMADol (ULTRAM) 50 MG tablet Take 1 tablet (50 mg total) by mouth every 6 (six) hours as needed. 02/10/17   Triplett, Tammy, PA-C    Allergies    Codeine and Eggs or egg-derived products  Review of Systems   Review of Systems  Constitutional: Negative for chills and fever.  HENT: Negative for congestion, rhinorrhea and sore throat.   Eyes: Negative for visual disturbance.  Respiratory: Negative for cough and shortness of breath.   Cardiovascular: Negative for chest pain and leg swelling.  Gastrointestinal: Positive for abdominal pain and nausea. Negative for diarrhea and vomiting.  Genitourinary: Positive for dysuria.  Musculoskeletal: Negative for back pain and neck pain.  Skin: Negative for rash.  Neurological: Negative for dizziness, light-headedness and headaches.  Hematological: Does not bruise/bleed easily.  Psychiatric/Behavioral: Negative for confusion.    Physical Exam Updated Vital Signs BP (!) 156/71 (BP Location: Right Arm)   Pulse 72   Temp 98.4 F (36.9 C) (Oral)   Resp 16   Ht 1.524 m (5')   Wt 62.6 kg   SpO2 94%   BMI 26.95 kg/m   Physical Exam Vitals and nursing note reviewed.  Constitutional:      General: She is not in acute distress.    Appearance: Normal appearance. She is well-developed.  HENT:     Head: Normocephalic and atraumatic.  Eyes:     Extraocular Movements: Extraocular movements intact.     Conjunctiva/sclera: Conjunctivae normal.     Pupils:  Pupils are equal, round, and reactive to light.  Cardiovascular:     Rate and Rhythm: Normal rate and regular rhythm.     Heart sounds: No murmur heard.   Pulmonary:     Effort: Pulmonary effort is normal. No respiratory distress.     Breath sounds: Normal breath sounds.  Abdominal:     Palpations: Abdomen is soft.     Tenderness: There is no abdominal tenderness. There is no right CVA tenderness or guarding.  Musculoskeletal:        General: No swelling. Normal range of motion.     Cervical back: Normal range of motion and neck supple.  Skin:    General: Skin is warm and dry.     Capillary Refill: Capillary refill takes less than  2 seconds.  Neurological:     General: No focal deficit present.     Mental Status: She is alert and oriented to person, place, and time.     Cranial Nerves: No cranial nerve deficit.     Sensory: No sensory deficit.     Motor: No weakness.     ED Results / Procedures / Treatments   Labs (all labs ordered are listed, but only abnormal results are displayed) Labs Reviewed  COMPREHENSIVE METABOLIC PANEL - Abnormal; Notable for the following components:      Result Value   Glucose, Bld 101 (*)    Calcium 8.7 (*)    All other components within normal limits  URINALYSIS, ROUTINE W REFLEX MICROSCOPIC - Abnormal; Notable for the following components:   Hgb urine dipstick SMALL (*)    Leukocytes,Ua TRACE (*)    Bacteria, UA RARE (*)    All other components within normal limits  URINE CULTURE  LIPASE, BLOOD  CBC    EKG None  Radiology CT Abdomen Pelvis W Contrast  Result Date: 09/01/2019 CLINICAL DATA:  RIGHT LOWER QUADRANT abdominal pain. Urinary tract infection symptoms for 1 week. Patient was put on antibiotic treatment 1 week ago with no improvement. History of hypertension, COPD, asthma, hysterectomy, hernia repair. EXAM: CT ABDOMEN AND PELVIS WITH CONTRAST TECHNIQUE: Multidetector CT imaging of the abdomen and pelvis was performed using the  standard protocol following bolus administration of intravenous contrast. CONTRAST:  151mL OMNIPAQUE IOHEXOL 300 MG/ML  SOLN COMPARISON:  CT of the abdomen and pelvis on 08/17/2017 FINDINGS: Lower chest: No acute abnormality. Hepatobiliary: The liver is homogeneous. 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 No hepatomegaly. Gallbladder is present. Pancreas: Unremarkable. No pancreatic ductal dilatation or surrounding inflammatory changes. Spleen: Normal in size without focal abnormality. Adrenals/Urinary Tract: Adrenal glands are normal in appearance. There is heterogeneous nephrogram of the RIGHT kidney consistent with acute pyelonephritis. No evidence for renal abscess. LEFT kidney is unremarkable. The ureters are unremarkable. The bladder and visualized portion of the urethra are normal. Stomach/Bowel: Stomach and small bowel loops are normal in appearance. The appendix is not well seen but there is no inflammatory change in the RIGHT LOWER QUADRANT. Colon is unremarkable. Vascular/Lymphatic: No significant vascular findings are present. No enlarged abdominal or pelvic lymph nodes. Reproductive: Hysterectomy.  No adnexal mass. Other: No free pelvic fluid. Anterior abdominal wall is unremarkable. Musculoskeletal: Degenerative changes in the lumbar spine, notably at L2-3 and L3-4. There is focal convex RIGHT scoliosis at L3-4. There is 9 millimeters of LEFT LATERAL listhesis of L2 on L3 and 4 millimeters of RIGHT LATERAL listhesis of L3 on L4. No lytic or blastic lesions. IMPRESSION: 1. Heterogeneous nephrogram of the RIGHT kidney consistent with acute pyelonephritis. No evidence for renal abscess. 2. Hysterectomy. 3. Degenerative changes and scoliosis of the lumbar spine. Electronically Signed   By: Nolon Nations M.D.   On: 09/01/2019 18:31    Procedures Procedures (including critical care time)  Medications Ordered in ED Medications  0.9 %  sodium chloride infusion ( Intravenous New  Bag/Given 09/01/19 1659)  cefTRIAXone (ROCEPHIN) 1 g in sodium chloride 0.9 % 100 mL IVPB (1 g Intravenous New Bag/Given 09/01/19 1903)  sodium chloride flush (NS) 0.9 % injection 3 mL (3 mLs Intravenous Given 09/01/19 1659)  ondansetron (ZOFRAN) injection 4 mg (4 mg Intravenous Given 09/01/19 1659)  iohexol (OMNIPAQUE) 300 MG/ML solution 100 mL (100 mLs Intravenous Contrast Given 09/01/19 1749)    ED Course  I have reviewed the triage vital signs and the nursing notes.  Pertinent labs & imaging results that were available during my care of the patient were reviewed by me and considered in my medical decision making (see chart for details).    MDM Rules/Calculators/A&P                          Urinalysis not overly impressive for urinary tract infection.  Patient with right-sided abdominal pain.  Urine sent for culture.  But based on the right side abdominal pain CT scan abdomen was ordered to rule out any other causes of the pain.  CT came back consistent with right pyelonephritis.  Patient here nontoxic no acute distress.  Will be switched over to Rocephin here 1 g and then continue with Keflex at home.  Patient instructed that she should see improvement over the next couple days if not she needs to return or return earlier for any new or worse symptoms.  And patient will need to follow-up with her primary care doctor.     Final Clinical Impression(s) / ED Diagnoses Final diagnoses:  Pyelonephritis    Rx / DC Orders ED Discharge Orders         Ordered    cephALEXin (KEFLEX) 500 MG capsule  4 times daily     Discontinue  Reprint     09/01/19 1924    ondansetron (ZOFRAN ODT) 4 MG disintegrating tablet  Every 8 hours PRN     Discontinue  Reprint     09/01/19 1924           Fredia Sorrow, MD 09/01/19 1929

## 2019-09-01 NOTE — ED Triage Notes (Signed)
States she was given and RX for macrobid last week and still has UTI symptoms

## 2019-09-03 LAB — URINE CULTURE: Culture: <10000 — AB

## 2019-09-05 DIAGNOSIS — F418 Other specified anxiety disorders: Secondary | ICD-10-CM | POA: Diagnosis not present

## 2019-09-17 DIAGNOSIS — E785 Hyperlipidemia, unspecified: Secondary | ICD-10-CM | POA: Diagnosis not present

## 2019-09-17 DIAGNOSIS — J449 Chronic obstructive pulmonary disease, unspecified: Secondary | ICD-10-CM | POA: Diagnosis not present

## 2019-09-17 DIAGNOSIS — I10 Essential (primary) hypertension: Secondary | ICD-10-CM | POA: Diagnosis not present

## 2019-09-17 DIAGNOSIS — N12 Tubulo-interstitial nephritis, not specified as acute or chronic: Secondary | ICD-10-CM | POA: Diagnosis not present

## 2019-09-17 DIAGNOSIS — K219 Gastro-esophageal reflux disease without esophagitis: Secondary | ICD-10-CM | POA: Diagnosis not present

## 2019-12-15 DIAGNOSIS — J449 Chronic obstructive pulmonary disease, unspecified: Secondary | ICD-10-CM | POA: Diagnosis not present

## 2019-12-15 DIAGNOSIS — R300 Dysuria: Secondary | ICD-10-CM | POA: Diagnosis not present

## 2019-12-15 DIAGNOSIS — Z23 Encounter for immunization: Secondary | ICD-10-CM | POA: Diagnosis not present

## 2019-12-15 DIAGNOSIS — I10 Essential (primary) hypertension: Secondary | ICD-10-CM | POA: Diagnosis not present

## 2020-03-30 DIAGNOSIS — I10 Essential (primary) hypertension: Secondary | ICD-10-CM | POA: Diagnosis not present

## 2020-03-30 DIAGNOSIS — J449 Chronic obstructive pulmonary disease, unspecified: Secondary | ICD-10-CM | POA: Diagnosis not present

## 2020-03-30 DIAGNOSIS — K219 Gastro-esophageal reflux disease without esophagitis: Secondary | ICD-10-CM | POA: Diagnosis not present

## 2020-03-30 DIAGNOSIS — F418 Other specified anxiety disorders: Secondary | ICD-10-CM | POA: Diagnosis not present

## 2020-04-27 DIAGNOSIS — K219 Gastro-esophageal reflux disease without esophagitis: Secondary | ICD-10-CM | POA: Diagnosis not present

## 2020-04-27 DIAGNOSIS — J449 Chronic obstructive pulmonary disease, unspecified: Secondary | ICD-10-CM | POA: Diagnosis not present

## 2020-04-27 DIAGNOSIS — F418 Other specified anxiety disorders: Secondary | ICD-10-CM | POA: Diagnosis not present

## 2020-04-27 DIAGNOSIS — I10 Essential (primary) hypertension: Secondary | ICD-10-CM | POA: Diagnosis not present

## 2020-07-28 DIAGNOSIS — J449 Chronic obstructive pulmonary disease, unspecified: Secondary | ICD-10-CM | POA: Diagnosis not present

## 2020-07-28 DIAGNOSIS — I10 Essential (primary) hypertension: Secondary | ICD-10-CM | POA: Diagnosis not present

## 2020-07-28 DIAGNOSIS — J189 Pneumonia, unspecified organism: Secondary | ICD-10-CM | POA: Diagnosis not present

## 2020-07-28 DIAGNOSIS — F418 Other specified anxiety disorders: Secondary | ICD-10-CM | POA: Diagnosis not present

## 2020-07-28 DIAGNOSIS — K219 Gastro-esophageal reflux disease without esophagitis: Secondary | ICD-10-CM | POA: Diagnosis not present

## 2020-08-17 DIAGNOSIS — I10 Essential (primary) hypertension: Secondary | ICD-10-CM | POA: Diagnosis not present

## 2020-08-17 DIAGNOSIS — F17210 Nicotine dependence, cigarettes, uncomplicated: Secondary | ICD-10-CM | POA: Diagnosis not present

## 2020-08-17 DIAGNOSIS — R300 Dysuria: Secondary | ICD-10-CM | POA: Diagnosis not present

## 2020-08-17 DIAGNOSIS — J449 Chronic obstructive pulmonary disease, unspecified: Secondary | ICD-10-CM | POA: Diagnosis not present

## 2020-10-28 DIAGNOSIS — F17210 Nicotine dependence, cigarettes, uncomplicated: Secondary | ICD-10-CM | POA: Diagnosis not present

## 2020-10-28 DIAGNOSIS — J449 Chronic obstructive pulmonary disease, unspecified: Secondary | ICD-10-CM | POA: Diagnosis not present

## 2020-10-28 DIAGNOSIS — I10 Essential (primary) hypertension: Secondary | ICD-10-CM | POA: Diagnosis not present

## 2020-10-28 DIAGNOSIS — F418 Other specified anxiety disorders: Secondary | ICD-10-CM | POA: Diagnosis not present

## 2021-02-27 HISTORY — PX: REPLACEMENT TOTAL KNEE: SUR1224

## 2021-03-24 ENCOUNTER — Emergency Department (HOSPITAL_COMMUNITY): Payer: PPO

## 2021-03-24 ENCOUNTER — Encounter (HOSPITAL_COMMUNITY): Payer: Self-pay | Admitting: *Deleted

## 2021-03-24 ENCOUNTER — Emergency Department (HOSPITAL_COMMUNITY)
Admission: EM | Admit: 2021-03-24 | Discharge: 2021-03-24 | Disposition: A | Discharge status: Home / Self Care | Payer: PPO | Attending: Emergency Medicine | Admitting: Emergency Medicine

## 2021-03-24 DIAGNOSIS — M7122 Synovial cyst of popliteal space [Baker], left knee: Secondary | ICD-10-CM | POA: Diagnosis not present

## 2021-03-24 DIAGNOSIS — J449 Chronic obstructive pulmonary disease, unspecified: Secondary | ICD-10-CM | POA: Insufficient documentation

## 2021-03-24 DIAGNOSIS — M79605 Pain in left leg: Secondary | ICD-10-CM

## 2021-03-24 DIAGNOSIS — R0602 Shortness of breath: Secondary | ICD-10-CM | POA: Diagnosis not present

## 2021-03-24 DIAGNOSIS — Z79899 Other long term (current) drug therapy: Secondary | ICD-10-CM | POA: Diagnosis not present

## 2021-03-24 DIAGNOSIS — I10 Essential (primary) hypertension: Secondary | ICD-10-CM | POA: Diagnosis not present

## 2021-03-24 DIAGNOSIS — Z7951 Long term (current) use of inhaled steroids: Secondary | ICD-10-CM | POA: Diagnosis not present

## 2021-03-24 LAB — CBC WITH DIFFERENTIAL/PLATELET
Abs Immature Granulocytes: 0.02 10*3/uL (ref 0.00–0.07)
Basophils Absolute: 0.1 10*3/uL (ref 0.0–0.1)
Basophils Relative: 1 %
Eosinophils Absolute: 0.2 10*3/uL (ref 0.0–0.5)
Eosinophils Relative: 3 %
HCT: 35.9 % — ABNORMAL LOW (ref 36.0–46.0)
Hemoglobin: 11.6 g/dL — ABNORMAL LOW (ref 12.0–15.0)
Immature Granulocytes: 0 %
Lymphocytes Relative: 24 %
Lymphs Abs: 1.7 10*3/uL (ref 0.7–4.0)
MCH: 26.5 pg (ref 26.0–34.0)
MCHC: 32.3 g/dL (ref 30.0–36.0)
MCV: 82.2 fL (ref 80.0–100.0)
Monocytes Absolute: 0.5 10*3/uL (ref 0.1–1.0)
Monocytes Relative: 7 %
Neutro Abs: 4.7 10*3/uL (ref 1.7–7.7)
Neutrophils Relative %: 65 %
Platelets: 215 10*3/uL (ref 150–400)
RBC: 4.37 MIL/uL (ref 3.87–5.11)
RDW: 14.3 % (ref 11.5–15.5)
WBC: 7.2 10*3/uL (ref 4.0–10.5)
nRBC: 0 % (ref 0.0–0.2)

## 2021-03-24 LAB — BASIC METABOLIC PANEL
Anion gap: 6 (ref 5–15)
BUN: 10 mg/dL (ref 8–23)
CO2: 28 mmol/L (ref 22–32)
Calcium: 8.7 mg/dL — ABNORMAL LOW (ref 8.9–10.3)
Chloride: 99 mmol/L (ref 98–111)
Creatinine, Ser: 0.7 mg/dL (ref 0.44–1.00)
GFR, Estimated: >60 mL/min (ref >60)
Glucose, Bld: 107 mg/dL — ABNORMAL HIGH (ref 70–99)
Potassium: 3.7 mmol/L (ref 3.5–5.1)
Sodium: 133 mmol/L — ABNORMAL LOW (ref 135–145)

## 2021-03-24 MED ORDER — HYDROCODONE-ACETAMINOPHEN 5-325 MG PO TABS
1.0000 | ORAL_TABLET | Freq: Once | ORAL | Status: AC
  Administered 2021-03-24: 1 via ORAL
  Filled 2021-03-24: qty 1

## 2021-03-24 NOTE — Discharge Instructions (Signed)
It was a pleasure taking care of you today. 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 Continue to use your knee brace. Elevate left leg. You may take over the counter ibuprofen or tylenol as needed for pain. Follow-up with PCP if symptoms do not improve over the next few days. Return to the ER for new or worsening symptoms.

## 2021-03-24 NOTE — ED Notes (Signed)
Put pt in gown and hooked up to cardiac monitor

## 2021-03-24 NOTE — ED Triage Notes (Signed)
Left leg pain onset 4 days ago

## 2021-03-24 NOTE — ED Provider Notes (Signed)
[facility] EMERGENCY DEPARTMENT Provider Note   CSN: [id number] Arrival date & time: 03/24/21  1156     History  Chief Complaint  Patient presents with   Leg Pain    [name] is a 71 y.o. female with a past medical history significant for hypertension and COPD who presents to the ED due to left lower extremity pain x4 days.  Patient states her left knee both anterior and posterior aspects have been bothering her for the past 4 days.  No known injury.  Denies fever and chills however, notes she felt warm and cold while in the waiting room.  Denies erythema.  Left knee pain associated with edema.  Denies chest pain.  Admits to chronic shortness of breath due to COPD.       Home Medications Prior to Admission medications   Medication Sig Start Date End Date Taking? Authorizing Provider  albuterol (PROVENTIL HFA;VENTOLIN HFA) 108 (90 BASE) MCG/ACT inhaler Inhale 2 puffs into the lungs every 6 (six) hours as needed for wheezing or shortness of breath.   Yes [provider]  ALPRAZolam Duanne Moron) 0.5 MG tablet Take 0.5 mg by mouth 3 (three) times daily.   Yes [provider]  BREZTRI AEROSPHERE 160-9-4.8 MCG/ACT AERO Inhale 2 puffs into the lungs 2 (two) times daily. 03/08/21  Yes [provider]  cyclobenzaprine (FLEXERIL) 10 MG tablet Take 10 mg by mouth 3 (three) times daily as needed for muscle spasms.   Yes [provider]  diclofenac (VOLTAREN) 50 MG EC tablet Take 50 mg by mouth 2 (two) times daily. 03/18/21  Yes [provider]  escitalopram (LEXAPRO) 10 MG tablet Take 10 mg by mouth daily. 12/29/20  Yes [provider]  HYDROcodone-acetaminophen (NORCO/VICODIN) 5-325 MG tablet Take 1-2 tablets by mouth every 4 (four) hours as needed for moderate pain. 06/14/17  Yes [provider]  ipratropium-albuterol (DUONEB) 0.5-2.5 (3) MG/3ML SOLN Take 3 mLs by nebulization every 6 (six) hours as needed (for shortness of  breath/wheezing).   Yes [provider]  lisinopril-hydrochlorothiazide (ZESTORETIC) 20-12.5 MG tablet Take 1 tablet by mouth daily.   Yes [provider]  metoprolol succinate (TOPROL-XL) 50 MG 24 hr tablet Take 50 mg by mouth daily. Take with or immediately following a meal.   Yes [provider]  omeprazole (PRILOSEC) 20 MG capsule Take 20 mg by mouth 2 (two) times daily. 03/08/21  Yes [provider]  ondansetron (ZOFRAN ODT) 4 MG disintegrating tablet Take 1 tablet (4 mg total) by mouth every 8 (eight) hours as needed. Patient taking differently: Take 4 mg by mouth every 8 (eight) hours as needed for nausea. 09/01/19  Yes Fredia Sorrow, MD  cephALEXin (KEFLEX) 500 MG capsule Take 1 capsule (500 mg total) by mouth 4 (four) times daily. Patient not taking: Reported on 03/24/2021 09/01/19   Fredia Sorrow, MD  guaiFENesin (MUCINEX) 600 MG 12 hr tablet Take 1 tablet (600 mg total) by mouth 2 (two) times daily. Patient not taking: Reported on 03/24/2021 05/20/13   Kathie Dike, MD  levofloxacin (LEVAQUIN) 500 MG tablet Take 1 tablet (500 mg total) by mouth daily. Patient not taking: Reported on 03/24/2021 05/20/13   Kathie Dike, MD  naproxen (NAPROSYN) 500 MG tablet Take 1 tablet (500 mg total) by mouth 2 (two) times daily with a meal. Patient not taking: Reported on 03/24/2021 08/22/17   Sanjuana Kava, MD  predniSONE (DELTASONE) 10 MG tablet Take 40mg  po daily for 2 days then  30mg  po daily for 2 days then 20mg  po daily for 2 days then 10mg  po daily for 2 days then stop Patient not taking: Reported on 03/24/2021 05/20/13   Kathie Dike, MD  traMADol (ULTRAM) 50 MG tablet Take 1 tablet (50 mg total) by mouth every 6 (six) hours as needed. Patient not taking: Reported on 03/24/2021 02/10/17   Kem Parkinson, PA-C      Allergies    Codeine and Eggs or egg-derived products    Review of Systems   Review of Systems  Constitutional:  Negative for fever.   Respiratory:  Positive for shortness of breath (chronic).   Cardiovascular:  Negative for chest pain.  Musculoskeletal:  Positive for arthralgias and joint swelling.   Physical Exam Updated Vital Signs BP 131/74    Pulse 78    Temp 98.7 F (37.1 C) (Oral)    Resp 18    SpO2 98%  Physical Exam Vitals and nursing note reviewed.  Constitutional:      General: She is not in acute distress.    Appearance: She is not ill-appearing.  HENT:     Head: Normocephalic.  Eyes:     Pupils: Pupils are equal, round, and reactive to light.  Cardiovascular:     Rate and Rhythm: Normal rate and regular rhythm.     Pulses: Normal pulses.     Heart sounds: Normal heart sounds. No murmur heard.   No friction rub. No gallop.  Pulmonary:     Effort: Pulmonary effort is normal.     Breath sounds: Normal breath sounds.  Abdominal:     General: Abdomen is flat. There is no distension.     Palpations: Abdomen is soft.     Tenderness: There is no abdominal tenderness. There is no guarding or rebound.  Musculoskeletal:        General: Normal range of motion.     Cervical back: Neck supple.     Comments: Edema to left knee. TTP throughout anterior and posterior aspect of left knee. Pedal pulses palpable. No erythema.   Skin:    General: Skin is warm and dry.  Neurological:     General: No focal deficit present.     Mental Status: She is alert.  Psychiatric:        Mood and Affect: Mood normal.        Behavior: Behavior normal.    ED Results / Procedures / Treatments   Labs (all labs ordered are listed, but only abnormal results are displayed) Labs Reviewed  CBC WITH DIFFERENTIAL/PLATELET - Abnormal; Notable for the following components:      Result Value   Hemoglobin 11.6 (*)    HCT 35.9 (*)    All other components within normal limits  BASIC METABOLIC PANEL - Abnormal; Notable for the following components:   Sodium 133 (*)    Glucose, Bld 107 (*)    Calcium 8.7 (*)    All other components  within normal limits    EKG EKG Interpretation  Date/Time:  Thursday March 24 2021 14:05:51 EST Ventricular Rate:  76 PR Interval:  191 QRS Duration: 91 QT Interval:  421 QTC Calculation: 474 R Axis:   87 Text Interpretation: Sinus rhythm Borderline right axis deviation Since last tracing infero-lateral ST depression has improved. Confirmed by Calvert Cantor [id number]) on 03/24/2021 2:15:53 PM  Radiology US Venous Img Lower  Left (DVT Study)  Result Date: 03/24/2021 CLINICAL DATA:  Pain and edema x4 days EXAM: LEFT LOWER  EXTREMITY VENOUS DOPPLER ULTRASOUND TECHNIQUE: Gray-scale sonography with compression, as well as color and duplex ultrasound, were performed to evaluate the deep venous system(s) from the level of the common femoral vein through the popliteal and proximal calf veins. COMPARISON:  None. FINDINGS: VENOUS Normal compressibility of the common femoral, superficial femoral, and popliteal veins, as well as the visualized calf veins. Visualized portions of profunda femoral vein and great saphenous vein unremarkable. No filling defects to suggest DVT on grayscale or color Doppler imaging. Doppler waveforms show normal direction of venous flow, normal respiratory phasicity and response to augmentation. Limited views of the contralateral common femoral vein are unremarkable. OTHER Elongated 6.5 cm cystic collection in the posterior popliteal fossa. Limitations: none IMPRESSION: 1. Negative for left lower extremity DVT. 2. Left [name]'s cyst Electronically Signed   By: Lucrezia Europe M.D.   On: 03/24/2021 14:42   DG Knee Complete 4 Views Left  Result Date: 03/24/2021 CLINICAL DATA:  Left knee pain and swelling EXAM: LEFT KNEE - COMPLETE 4+ VIEW COMPARISON:  None. FINDINGS: No evidence of fracture, dislocation, or joint effusion. Mild medial compartment joint space narrowing. Mild enthesopathy at the quadriceps tendon insertion. Edematous appearance of the soft tissues. IMPRESSION: 1. No acute  osseous findings. Mild medial compartment joint space narrowing. 2. Edematous appearance of the soft tissues. Electronically Signed   By: Davina Poke D.O.   On: 03/24/2021 14:56    Procedures Procedures    Medications Ordered in ED Medications  HYDROcodone-acetaminophen (NORCO/VICODIN) 5-325 MG per tablet 1 tablet (1 tablet Oral Given 03/24/21 1406)    ED Course/ Medical Decision Making/ A&P Clinical Course as of 03/24/21 1509  Thu Mar 24, 2021  1503 Sodium(!): 133 [CA]  1503 Glucose(!): 107 [CA]  1503 Hemoglobin(!): 11.6 [CA]    Clinical Course User Index [CA] Suzy Bouchard, PA-C                           Medical Decision Making Amount and/or Complexity of Data Reviewed Independent Historian:     Details: daughter in law at bedside provider some history External Data Reviewed: notes. Labs: ordered. Decision-making details documented in ED Course. Radiology: ordered and independent interpretation performed. Decision-making details documented in ED Course. ECG/medicine tests: ordered and independent interpretation performed. Decision-making details documented in ED Course.  Risk Prescription drug management.  This patient presents to the ED for concern of LLE pain, this involves an extensive number of treatment options, and is a complaint that carries with it a high risk of complications and morbidity.  The differential diagnosis includes DVT, bony fracture, infection  71 year old female presents to the ED due to left lower extremity pain x4 days associated with edema.  Pain located around left knee.  No known injury.  Denies fever.  Upon arrival, stable vitals.  Patient is afebrile, not tachycardic or hypoxic.  Patient in no acute distress.  Tenderness throughout anterior and posterior aspect of left knee with surrounding edema.  No erythema.  Left lower extremity neurovascularly intact with soft compartments.  Ultrasound to rule out DVT.  X-ray to rule out any bony  fractures.  Norco given for pain management. Low suspicion for septic joint. Low suspicion for cellulitis.  X-ray personally reviewed and interpreted which demonstrated soft tissue edema. No bony fractures. Agree with radiology interpretation. [place] positive for left baker's cysts which I suspect is causing patient's pain and edema. No DVT. Left knee brace placed. Hospitalization not warranted at  this time given no DVT or signs of systemic infection. Patient advised to take OTC ibuprofen or tylenol as needed for pain. Follow-up with PCP if symptoms do not improve over the next few days. Strict ED precautions discussed with patient. Patient states understanding and agrees to plan. Patient discharged home in no acute distress and stable vitals.  Discussed with Dr. Karle Starch who agrees with assessment and plan.  Cardiac Monitoring:  The patient was maintained on a cardiac monitor.  I personally viewed and interpreted the cardiac monitored which showed an underlying rhythm of: NSR  Critical Interventions:  Left knee brace  Reevaluation:  After the interventions noted above, I reevaluated the patient and found that they have :improved after pain medication  Social Determinants of Health:  Elderly, lives alone        Final Clinical Impression(s) / ED Diagnoses Final diagnoses:  Left leg pain  [name]'s cyst of knee, left    Rx / DC Orders ED Discharge Orders     None         Karie Kirks 03/24/21 1510    Truddie Hidden, MD 03/25/21 1453

## 2021-03-24 NOTE — ED Notes (Signed)
Pt verbalized understanding of discharge paperwork.

## 2021-08-08 ENCOUNTER — Ambulatory Visit: Payer: PPO | Attending: Orthopaedic Surgery

## 2021-08-08 DIAGNOSIS — M25562 Pain in left knee: Secondary | ICD-10-CM | POA: Diagnosis present

## 2021-08-08 DIAGNOSIS — M25662 Stiffness of left knee, not elsewhere classified: Secondary | ICD-10-CM | POA: Insufficient documentation

## 2021-08-08 DIAGNOSIS — R600 Localized edema: Secondary | ICD-10-CM | POA: Insufficient documentation

## 2021-08-08 NOTE — Therapy (Signed)
[facility] [place], [place], [address] Phone: [phone]   Fax:  [phone]  Physical Therapy Evaluation  Patient Details  Name: [name] MRN: [id number] Date of Birth: [birth year] Referring Provider (PT): Nickola Major, MD   Encounter Date: 08/08/2021   PT End of Session - 08/08/21 0946     Visit Number 1    Number of Visits 12    Date for PT Re-Evaluation 09/30/21    PT Start Time 0948    PT Stop Time 6967    PT Time Calculation (min) 47 min    Activity Tolerance Patient tolerated treatment well    Behavior During Therapy [facility] for tasks assessed/performed             Past Medical History:  Diagnosis Date   Anxiety    Asthma    COPD (chronic obstructive pulmonary disease) ([place])    Hypertension     Past Surgical History:  Procedure Laterality Date   ABDOMINAL HYSTERECTOMY     BREAST SURGERY     left lumpectomy   HERNIA REPAIR     TONSILLECTOMY      There were no vitals filed for this visit.    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 She had home health therapy for about 2 weeks with her last visit being on 6/9. She has been using her walker at home, but she will use her cane to help with she goes out in the community. She notes that her knee is really tender. She noticed that her foot has been really swollen, but that is getting better as she is now able to put on shoes. She notes that she has been doing the HEP provided be her home health physical therapist and Psychologist, sport and exercise.    Limitations Walking;Standing    How long can you stand comfortably? 30 minutes with her walker    How long can you walk comfortably? 30 minutes with her walker    Patient Stated Goals cleaning her house, cleaning, driving    Currently in Pain? Yes    Pain Score 2     Pain Location Knee    Pain Orientation Left    Pain Descriptors / Indicators  Sore;Aching;Throbbing    Pain Type Surgical pain    Pain Radiating Towards none    Pain Onset 1 to 4 weeks ago    Pain Frequency Constant    Aggravating Factors  bending her knee    Pain Relieving Factors ice    Effect of Pain on Daily Activities she notes that her knee is slowing her down, but it not keeping her from doing anything                [facility] PT Assessment - 08/08/21 0001       Assessment   Medical Diagnosis Left TKA    Referring Provider (PT) Nickola Major, MD    Onset Date/Surgical Date 07/18/21    Next MD Visit 09/01/21    Prior Therapy Yes, home health      Precautions   Precautions None      Restrictions   Weight Bearing Restrictions No      Balance Screen   Has the patient fallen in the past 6 months No    Has the patient had a decrease in activity level because of a fear of falling?  No    Is the patient  reluctant to leave their home because of a fear of falling?  No      Home Ecologist residence    Home Access Stairs to enter    Entrance Stairs-Number of Steps 3   still going "up with the good and down with the bad"   Entrance Stairs-Rails Can reach both    Home Layout One level      Prior Function   Level of Independence Independent    Vocation Retired    Leisure walking around the park, shopping, and household activities      Cognition   Overall Cognitive Status Within Functional Limits for tasks assessed    Attention Focused    Focused Attention Appears intact    Memory Appears intact    Awareness Appears intact    Problem Solving Appears intact      Observation/Other Assessments   Observations Incision appears to be well healing; no signs or symptoms of DVT    Focus on Therapeutic Outcomes (FOTO)  50.16      Sensation   Additional Comments Patient reports no numbness or tingling      ROM / Strength   AROM / PROM / Strength AROM;PROM      AROM   AROM Assessment Site Knee    Right/Left Knee Left;Right     Right Knee Extension 2    Right Knee Flexion 127    Left Knee Extension 8   painful   Left Knee Flexion 100   painful     PROM   PROM Assessment Site Knee    Right/Left Knee Left    Left Knee Flexion 107      Palpation   Patella mobility Left: WFL and nonpainful    Palpation comment TTP: left quadriceps, IT band, hamstrings, hip adductors, and gastroc/soleus      Ambulation/Gait   Ambulation/Gait Yes    Ambulation/Gait Assistance 6: Modified independent (Device/Increase time)    Assistive device Straight cane    Gait Pattern Decreased stride length;Step-through pattern;Left flexed knee in stance;Right flexed knee in stance;Trunk flexed;Wide base of support    Ambulation Surface Level;Indoor    Gait velocity decreased                        Objective measurements completed on examination: See above findings.       [facility] Adult PT Treatment/Exercise - 08/08/21 0001       Modalities   Modalities Vasopneumatic      Vasopneumatic   Number Minutes Vasopneumatic  10 minutes    Vasopnuematic Location  Knee    Vasopneumatic Pressure Low    Vasopneumatic Temperature  34                          PT Long Term Goals - 08/08/21 1549       PT LONG TERM GOAL #1   Title Patient will be independent with her HEP.    Time 6    Period Weeks    Status New    Target Date 09/19/21      PT LONG TERM GOAL #2   Title Patient will be able to demonstrate at least 120 degrees of left knee flexion for improved function navigating stairs.    Time 6    Period Weeks    Status New    Target Date 09/19/21      PT LONG TERM  GOAL #3   Title Patient will be able to demonstrate left active knee extension within 5 degrees of neutral for improved gait mechanics.    Time 6    Period Weeks    Status New    Target Date 09/19/21      PT LONG TERM GOAL #4   Title Patient will be able to navigate at least 4 steps with a reciprocal gait pattern for improved  function navigating her household environment.    Time 6    Period Weeks    Status New    Target Date 09/19/21                    Plan - 08/08/21 1249     Clinical Impression Statement Patient is a 71 year old female presenting to physical therapy following a left TKA on 07/18/21. She presented with low pain severity and moderate irritability with left knee AROM  being the most aggravating to her familiar symptoms. She presented ambulating with a single point cane, but she was able to safely walk short distances without an assistive device. Recommend that she continue with skilled physical therapy to address her remaining impairments to return to her prior level of function.    Personal Factors and Comorbidities Transportation;Comorbidity 1    Comorbidities HTN    Examination-Activity Limitations Locomotion Level;Transfers;Squat;Stairs;Stand    Examination-Participation Restrictions Cleaning;Community Activity;Shop    Stability/Clinical Decision Making Stable/Uncomplicated    Clinical Decision Making Low    Rehab Potential Excellent    PT Frequency 2x / week    PT Duration 6 weeks    PT Treatment/Interventions ADLs/Self Care Home Management;Cryotherapy;Electrical Stimulation;Moist Heat;Gait training;Stair training;Functional mobility training;Therapeutic activities;Therapeutic exercise;Balance training;Neuromuscular re-education;Patient/family education;Passive range of motion;Manual techniques;Taping;Vasopneumatic Device    PT Next Visit Plan nustep, straight leg raise, heel slides, lunges, and modalites as needed    Consulted and Agree with Plan of Care Patient             Patient will benefit from skilled therapeutic intervention in order to improve the following deficits and impairments:  Abnormal gait, Decreased range of motion, Difficulty walking, Pain, Decreased activity tolerance, Hypomobility, Increased edema, Decreased strength, Decreased mobility  Visit  Diagnosis: Stiffness of left knee, not elsewhere classified  Acute pain of left knee  Localized edema     Problem List Patient Active Problem List   Diagnosis Date Noted   Acute respiratory failure ([place]) 05/19/2013   Acute bronchitis 05/19/2013   HTN (hypertension) 05/19/2013   Rationale for Evaluation and Treatment Rehabilitation   Darlin Coco, PT 08/08/2021, 3:55 PM  [facility] [place], [place], [address] Phone: [phone]   Fax:  [phone]  Name: [name] MRN: [id number] Date of Birth: 1950/09/25

## 2021-08-12 ENCOUNTER — Ambulatory Visit: Payer: PPO | Admitting: *Deleted

## 2021-08-12 DIAGNOSIS — M25662 Stiffness of left knee, not elsewhere classified: Secondary | ICD-10-CM

## 2021-08-12 DIAGNOSIS — M25562 Pain in left knee: Secondary | ICD-10-CM

## 2021-08-12 DIAGNOSIS — R600 Localized edema: Secondary | ICD-10-CM

## 2021-08-12 NOTE — Therapy (Signed)
[facility] [place], [place], [address] Phone: [phone]   Fax:  [phone]  Physical Therapy Treatment  Patient Details  Name: [name] MRN: [id number] Date of Birth: 1950-05-08 Referring Provider (PT): Nickola Major, MD   Encounter Date: 08/12/2021   PT End of Session - 08/12/21 0909     Visit Number 2    Number of Visits 12    Date for PT Re-Evaluation 09/30/21    PT Start Time 0900             Past Medical History:  Diagnosis Date   Anxiety    Asthma    COPD (chronic obstructive pulmonary disease) ([place])    Hypertension     Past Surgical History:  Procedure Laterality Date   ABDOMINAL HYSTERECTOMY     BREAST SURGERY     left lumpectomy   HERNIA REPAIR     TONSILLECTOMY      There were no vitals filed for this visit.   Subjective Assessment - 08/12/21 0907     Subjective Doing good ! LT knee pain 2/10    Limitations Walking;Standing    How long can you stand comfortably? 30 minutes with her walker    How long can you walk comfortably? 30 minutes with her walker    Patient Stated Goals cleaning her house, cleaning, driving    Currently in Pain? Yes    Pain Score 2     Pain Location Knee    Pain Orientation Left    Pain Descriptors / Indicators Sore    Pain Type Surgical pain    Pain Onset 1 to 4 weeks ago                               [facility] Adult PT Treatment/Exercise - 08/12/21 0001       Exercises   Exercises Knee/Hip      Knee/Hip Exercises: Aerobic   Nustep L1 seat 7,6,5  x15 mins      Knee/Hip Exercises: Standing   Forward Lunges Left;2 sets;10 reps;5 seconds    Terminal Knee Extension --   8in   Rocker Board 4 minutes      Knee/Hip Exercises: Seated   Long Arc Quad Left;2 sets;10 reps;Weights    Long Arc Quad Weight 2 lbs.      Modalities   Modalities Vasopneumatic      Vasopneumatic   Number Minutes Vasopneumatic  10 minutes     Vasopnuematic Location  Knee    Vasopneumatic Pressure Low    Vasopneumatic Temperature  34      Manual Therapy   Manual Therapy Joint mobilization    Joint Mobilization patella mobs and scar mobs                          PT Long Term Goals - 08/08/21 1549       PT LONG TERM GOAL #1   Title Patient will be independent with her HEP.    Time 6    Period Weeks    Status New    Target Date 09/19/21      PT LONG TERM GOAL #2   Title Patient will be able to demonstrate at least 120 degrees of left knee flexion for improved function navigating stairs.    Time 6    Period Weeks    Status New  Target Date 09/19/21      PT LONG TERM GOAL #3   Title Patient will be able to demonstrate left active knee extension within 5 degrees of neutral for improved gait mechanics.    Time 6    Period Weeks    Status New    Target Date 09/19/21      PT LONG TERM GOAL #4   Title Patient will be able to navigate at least 4 steps with a reciprocal gait pattern for improved function navigating her household environment.    Time 6    Period Weeks    Status New    Target Date 09/19/21                   Plan - 08/12/21 0910     Clinical Impression Statement Pt arrived today doing fairly with min knee pain Rx focused on ROM progression and LE strengthening. Pt progressed well today and is currently ambulating with SPC.    Personal Factors and Comorbidities Transportation;Comorbidity 1    Comorbidities HTN    Examination-Activity Limitations Locomotion Level;Transfers;Squat;Stairs;Stand    Stability/Clinical Decision Making Stable/Uncomplicated    Rehab Potential Excellent    PT Frequency 2x / week    PT Duration 6 weeks    PT Treatment/Interventions ADLs/Self Care Home Management;Cryotherapy;Electrical Stimulation;Moist Heat;Gait training;Stair training;Functional mobility training;Therapeutic activities;Therapeutic exercise;Balance training;Neuromuscular  re-education;Patient/family education;Passive range of motion;Manual techniques;Taping;Vasopneumatic Device    PT Next Visit Plan nustep, straight leg raise, heel slides, lunges, and modalites as needed    Consulted and Agree with Plan of Care Patient             Patient will benefit from skilled therapeutic intervention in order to improve the following deficits and impairments:  Abnormal gait, Decreased range of motion, Difficulty walking, Pain, Decreased activity tolerance, Hypomobility, Increased edema, Decreased strength, Decreased mobility  Visit Diagnosis: Stiffness of left knee, not elsewhere classified  Acute pain of left knee  Localized edema     Problem List Patient Active Problem List   Diagnosis Date Noted   Acute respiratory failure ([place]) 05/19/2013   Acute bronchitis 05/19/2013   HTN (hypertension) 05/19/2013   Rationale for Evaluation and Treatment Rehabilitation  Tayvia Faughnan,CHRIS, PTA 08/12/2021, 12:31 PM  [facility] [address] [place], [place], [address] Phone: [phone]   Fax:  [phone]  Name: [name] MRN: [id number] Date of Birth: 1950/09/15

## 2021-08-16 ENCOUNTER — Ambulatory Visit: Payer: PPO

## 2021-08-16 DIAGNOSIS — M25662 Stiffness of left knee, not elsewhere classified: Secondary | ICD-10-CM | POA: Diagnosis not present

## 2021-08-16 DIAGNOSIS — R600 Localized edema: Secondary | ICD-10-CM

## 2021-08-16 DIAGNOSIS — M25562 Pain in left knee: Secondary | ICD-10-CM

## 2021-08-16 NOTE — Therapy (Signed)
[facility] [place], [place], [address] Phone: [phone]   Fax:  [phone]  Physical Therapy Treatment  Patient Details  Name: [name] MRN: [id number] Date of Birth: 1950/03/19 Referring Provider (PT): Nickola Major, MD   Encounter Date: 08/16/2021   PT End of Session - 08/16/21 1127     Visit Number 3    Number of Visits 12    Date for PT Re-Evaluation 09/30/21    PT Start Time 1115    PT Stop Time 1200    PT Time Calculation (min) 45 min    Activity Tolerance Patient tolerated treatment well    Behavior During Therapy [facility] for tasks assessed/performed             Past Medical History:  Diagnosis Date   Anxiety    Asthma    COPD (chronic obstructive pulmonary disease) ([place])    Hypertension     Past Surgical History:  Procedure Laterality Date   ABDOMINAL HYSTERECTOMY     BREAST SURGERY     left lumpectomy   HERNIA REPAIR     TONSILLECTOMY      There were no vitals filed for this visit.   Subjective Assessment - 08/16/21 1126     Subjective Patient reports that her knee itself is not hurting today. However, the weather makes it a little sore.    Limitations Walking;Standing    How long can you stand comfortably? 30 minutes with her walker    How long can you walk comfortably? 30 minutes with her walker    Patient Stated Goals cleaning her house, cleaning, driving    Currently in Pain? No/denies    Pain Onset 1 to 4 weeks ago                               [facility] Adult PT Treatment/Exercise - 08/16/21 0001       Knee/Hip Exercises: Stretches   Passive Hamstring Stretch Left;3 reps;30 seconds      Knee/Hip Exercises: Aerobic   Recumbent Bike L1 x 5 minutes; seat 1    Nustep L4 x 10 minutes; seat 4      Knee/Hip Exercises: Standing   Forward Lunges Left;Other (comment)   2 minutes   Rocker Board 5 minutes      Knee/Hip Exercises: Seated   Long Arc Quad  Both;Weights   2 minutes     Knee/Hip Exercises: Supine   Heel Slides Left;Other (comment)   2 minutes   Straight Leg Raises Both;20 reps                          PT Long Term Goals - 08/08/21 1549       PT LONG TERM GOAL #1   Title Patient will be independent with her HEP.    Time 6    Period Weeks    Status New    Target Date 09/19/21      PT LONG TERM GOAL #2   Title Patient will be able to demonstrate at least 120 degrees of left knee flexion for improved function navigating stairs.    Time 6    Period Weeks    Status New    Target Date 09/19/21      PT LONG TERM GOAL #3   Title Patient will be able to demonstrate left active knee extension within 5 degrees  of neutral for improved gait mechanics.    Time 6    Period Weeks    Status New    Target Date 09/19/21      PT LONG TERM GOAL #4   Title Patient will be able to navigate at least 4 steps with a reciprocal gait pattern for improved function navigating her household environment.    Time 6    Period Weeks    Status New    Target Date 09/19/21                   Plan - 08/16/21 1129     Clinical Impression Statement Patient was introduced to multiple new interventions for improved knee mobility. She required minimal cueing with a standing hamstring stretching for proper biomechanics to facilitate improved soft tissue extensibility. Fatigue was her primary limitation with today's interventions as she required multiple seated rest breaks throughout treatment. She reported that her knee felt good upon the conclusion of treatment. She continues to require skilled physical therapy to address her remaining impairments to return to her prior level of function.    Personal Factors and Comorbidities Transportation;Comorbidity 1    Comorbidities HTN    Examination-Activity Limitations Locomotion Level;Transfers;Squat;Stairs;Stand    Stability/Clinical Decision Making Stable/Uncomplicated    Rehab  Potential Excellent    PT Frequency 2x / week    PT Duration 6 weeks    PT Treatment/Interventions ADLs/Self Care Home Management;Cryotherapy;Electrical Stimulation;Moist Heat;Gait training;Stair training;Functional mobility training;Therapeutic activities;Therapeutic exercise;Balance training;Neuromuscular re-education;Patient/family education;Passive range of motion;Manual techniques;Taping;Vasopneumatic Device    PT Next Visit Plan nustep, straight leg raise, heel slides, lunges, and modalites as needed    Consulted and Agree with Plan of Care Patient             Patient will benefit from skilled therapeutic intervention in order to improve the following deficits and impairments:  Abnormal gait, Decreased range of motion, Difficulty walking, Pain, Decreased activity tolerance, Hypomobility, Increased edema, Decreased strength, Decreased mobility  Visit Diagnosis: Stiffness of left knee, not elsewhere classified  Acute pain of left knee  Localized edema     Problem List Patient Active Problem List   Diagnosis Date Noted   Acute respiratory failure ([place]) 05/19/2013   Acute bronchitis 05/19/2013   HTN (hypertension) 05/19/2013   Rationale for Evaluation and Treatment Rehabilitation   Darlin Coco, PT 08/16/2021, 12:11 PM  [facility] [address] [place], [place], [address] Phone: [phone]   Fax:  [phone]  Name: [name] MRN: [id number] Date of Birth: 07/23/1950

## 2021-08-19 ENCOUNTER — Ambulatory Visit: Payer: PPO

## 2021-08-19 DIAGNOSIS — M25562 Pain in left knee: Secondary | ICD-10-CM

## 2021-08-19 DIAGNOSIS — R600 Localized edema: Secondary | ICD-10-CM

## 2021-08-19 DIAGNOSIS — M25662 Stiffness of left knee, not elsewhere classified: Secondary | ICD-10-CM | POA: Diagnosis not present

## 2021-08-19 NOTE — Therapy (Signed)
[facility] [address] [place], [place], [address] Phone: [phone]   Fax:  [phone]  Physical Therapy Treatment  Patient Details  Name: [name] MRN: [id number] Date of Birth: Feb 01, 1951 Referring Provider (PT): Alvera Novel, MD   Encounter Date: 08/19/2021   PT End of Session - 08/19/21 0952     Visit Number 4    Number of Visits 12    Date for PT Re-Evaluation 09/30/21    PT Start Time 0945    PT Stop Time 1044    PT Time Calculation (min) 59 min    Activity Tolerance Patient tolerated treatment well    Behavior During Therapy [facility] for tasks assessed/performed             Past Medical History:  Diagnosis Date   Anxiety    Asthma    COPD (chronic obstructive pulmonary disease) ([facility])    Hypertension     Past Surgical History:  Procedure Laterality Date   ABDOMINAL HYSTERECTOMY     BREAST SURGERY     left lumpectomy   HERNIA REPAIR     TONSILLECTOMY      There were no vitals filed for this visit.   Subjective Assessment - 08/19/21 0951     Subjective Pt arrives for today's treatment session reporting 5/10 left knee pain.    Limitations Walking;Standing    How long can you stand comfortably? 30 minutes with her walker    How long can you walk comfortably? 30 minutes with her walker    Patient Stated Goals cleaning her house, cleaning, driving    Currently in Pain? Yes    Pain Score 5     Pain Location Shoulder    Pain Orientation Left    Pain Onset 1 to 4 weeks ago                               [facility] Adult PT Treatment/Exercise - 08/19/21 0001       Knee/Hip Exercises: Aerobic   Recumbent Bike Lvl 2 x 15 mins      Knee/Hip Exercises: Standing   Heel Raises Both;20 reps    Heel Raises Limitations Toe Raises x 20 reps    Forward Lunges Left;20 reps;3 seconds   14 inch step   Forward Step Up Both;20 reps;Hand Hold: 2;Step Height: 6"    Rocker Board 5 minutes       Knee/Hip Exercises: Seated   Long Arc Quad Both;20 reps;Weights    Long Arc Quad Weight 3 lbs.    Marching Both;20 reps;Limitations    Marching Limitations 3      Modalities   Modalities Programmer, applications Location Left knee    Electrical Stimulation Action IFC    Electrical Stimulation Parameters 80-150 Hz    Electrical Stimulation Goals Pain;Tone;Edema      Vasopneumatic   Number Minutes Vasopneumatic  15 minutes    Vasopnuematic Location  Knee    Vasopneumatic Pressure Low    Vasopneumatic Temperature  34                          PT Long Term Goals - 08/08/21 1549       PT LONG TERM GOAL #1   Title Patient will be independent with her HEP.    Time 6  Period Weeks    Status New    Target Date 09/19/21      PT LONG TERM GOAL #2   Title Patient will be able to demonstrate at least 120 degrees of left knee flexion for improved function navigating stairs.    Time 6    Period Weeks    Status New    Target Date 09/19/21      PT LONG TERM GOAL #3   Title Patient will be able to demonstrate left active knee extension within 5 degrees of neutral for improved gait mechanics.    Time 6    Period Weeks    Status New    Target Date 09/19/21      PT LONG TERM GOAL #4   Title Patient will be able to navigate at least 4 steps with a reciprocal gait pattern for improved function navigating her household environment.    Time 6    Period Weeks    Status New    Target Date 09/19/21                   Plan - 08/19/21 1017     Clinical Impression Statement Pt arrives for today's treatment session reporting 5/10 left knee pain.  Pt instructed in numerous standing exercises to increase strength and function.  Pt requiring min cues for proper technique for all newly added exercises.  Pt able to tolerate increased weight with seated exercises during today's session.  Normal responses to  estim and vaso noted upon removal.  Pt reported 0/10 left knee pain at completion of today's treatment session.             Patient will benefit from skilled therapeutic intervention in order to improve the following deficits and impairments:  Abnormal gait, Decreased range of motion, Difficulty walking, Pain, Decreased activity tolerance, Hypomobility, Increased edema, Decreased strength, Decreased mobility  Visit Diagnosis: Stiffness of left knee, not elsewhere classified  Acute pain of left knee  Localized edema     Problem List Patient Active Problem List   Diagnosis Date Noted   Acute respiratory failure (H[facility]) 05/19/2013   Acute bronchitis 05/19/2013   HTN (hypertension) 05/19/2013   Rationale for Evaluation and Treatment Rehabilitation  Newman Pies, PTA 08/19/2021, 10:45 AM  [facility] [address] [place], [place], [address] Phone: [phone]   Fax:  [phone]  Name: [name] MRN: [id number] Date of Birth: 1951/01/27

## 2021-08-23 ENCOUNTER — Ambulatory Visit: Payer: PPO | Admitting: Physical Therapy

## 2021-08-23 ENCOUNTER — Encounter: Payer: Self-pay | Admitting: Physical Therapy

## 2021-08-23 DIAGNOSIS — M25562 Pain in left knee: Secondary | ICD-10-CM

## 2021-08-23 DIAGNOSIS — M25662 Stiffness of left knee, not elsewhere classified: Secondary | ICD-10-CM

## 2021-08-23 DIAGNOSIS — R600 Localized edema: Secondary | ICD-10-CM

## 2021-08-26 ENCOUNTER — Ambulatory Visit: Payer: PPO

## 2021-08-26 DIAGNOSIS — M25562 Pain in left knee: Secondary | ICD-10-CM

## 2021-08-26 DIAGNOSIS — R600 Localized edema: Secondary | ICD-10-CM

## 2021-08-26 DIAGNOSIS — M25662 Stiffness of left knee, not elsewhere classified: Secondary | ICD-10-CM | POA: Diagnosis not present

## 2021-08-26 NOTE — Therapy (Signed)
OUTPATIENT PHYSICAL THERAPY TREATMENT NOTE   Patient Name: [name] MRN: [id number] DOB:Jun 29, 1950, 71 y.o., female Today's Date: 08/26/2021   REFERRING PROVIDER: Einar Crow, MD   PT End of Session - 08/26/21 [phone]     Visit Number 6    Number of Visits 12    Date for PT Re-Evaluation 09/30/21    PT Start Time 0945    PT Stop Time 8250    PT Time Calculation (min) 58 min    Activity Tolerance Patient tolerated treatment well    Behavior During Therapy WFL for tasks assessed/performed             Past Medical History:  Diagnosis Date   Anxiety    Asthma    COPD (chronic obstructive pulmonary disease) ([place])    Hypertension    Past Surgical History:  Procedure Laterality Date   ABDOMINAL HYSTERECTOMY     BREAST SURGERY     left lumpectomy   HERNIA REPAIR     TONSILLECTOMY     Patient Active Problem List   Diagnosis Date Noted   Acute respiratory failure ([place]) 05/19/2013   Acute bronchitis 05/19/2013   HTN (hypertension) 05/19/2013    REFERRING DIAG: Encounter for other specified aftercare  THERAPY DIAG:  Stiffness of left knee, not elsewhere classified  Acute pain of left knee  Localized edema  Rationale for Evaluation and Treatment Rehabilitation  PERTINENT HISTORY: none   PRECAUTIONS: none   SUBJECTIVE: Patient reports that her left knee is a little sore this morning, but she feels that this is due to the weather.   PAIN:  Are you having pain? Yes: NPRS scale: 2/10 Pain location: left knee Pain description: sore     TODAY'S TREATMENT:                                     6/30 EXERCISE LOG  Exercise Repetitions and Resistance Comments  Recumbent bike L5 x 15 minutes    Knee flexion BLE; 30# x 2 minutes    Knee extension  BLE; 10# x 2 minutes    Rocker board 5 minutes    Hamstring stretch  Standing; BLE; 4 x 30 seconds each   Step up Onto BOSU (ball up); 2 minutes; alternating LE     Blank cell = exercise not performed  today   Modalities  Unattended Estim: Knee, pre-mod at 80-150 Hz, 10 mins, Pain and Edema Vaso: Knee, 34 degrees, 10 mins, Pain and Edema            PT Long Term Goals - 08/23/21 1000       PT LONG TERM GOAL #1   Title Patient will be independent with her HEP.    Time 6    Period Weeks    Status On-going    Target Date 09/19/21      PT LONG TERM GOAL #2   Title Patient will be able to demonstrate at least 120 degrees of left knee flexion for improved function navigating stairs.    Time 6    Period Weeks    Status Achieved    Target Date 09/19/21      PT LONG TERM GOAL #3   Title Patient will be able to demonstrate left active knee extension within 5 degrees of neutral for improved gait mechanics.    Time 6    Period Weeks    Status  Achieved    Target Date 09/19/21      PT LONG TERM GOAL #4   Title Patient will be able to navigate at least 4 steps with a reciprocal gait pattern for improved function navigating her household environment.    Time 6    Period Weeks    Status On-going    Target Date 09/19/21              Plan - 08/26/21 0953     Clinical Impression Statement Patient was progressed with multiple new interventions for improved lower extremity strength with moderate difficulty She required minimal cueing with weighted knee flexion and extension for improved eccentric control needed for functional activities such as navigating stairs. She reported feeling alright upon the conclusion of treatment. She continues to require skilled physical therapy to address her remaining impairments to return to her prior level of function.   Personal Factors and Comorbidities Transportation;Comorbidity 1    Comorbidities HTN    Examination-Activity Limitations Locomotion Level;Transfers;Squat;Stairs;Stand    Examination-Participation Restrictions Cleaning;Community Activity;Shop    Stability/Clinical Decision Making Stable/Uncomplicated    Rehab Potential Excellent     PT Frequency 2x / week    PT Duration 6 weeks    PT Treatment/Interventions ADLs/Self Care Home Management;Cryotherapy;Electrical Stimulation;Moist Heat;Gait training;Stair training;Functional mobility training;Therapeutic activities;Therapeutic exercise;Balance training;Neuromuscular re-education;Patient/family education;Passive range of motion;Manual techniques;Taping;Vasopneumatic Device    PT Next Visit Plan nustep, straight leg raise, heel slides, lunges, and modalites as needed    Consulted and Agree with Plan of Care Patient               Darlin Coco, PT 08/26/2021, 10:46 AM

## 2021-08-29 ENCOUNTER — Ambulatory Visit: Payer: PPO | Attending: Orthopaedic Surgery

## 2021-08-29 DIAGNOSIS — R600 Localized edema: Secondary | ICD-10-CM | POA: Insufficient documentation

## 2021-08-29 DIAGNOSIS — M25662 Stiffness of left knee, not elsewhere classified: Secondary | ICD-10-CM | POA: Insufficient documentation

## 2021-08-29 DIAGNOSIS — M25562 Pain in left knee: Secondary | ICD-10-CM | POA: Insufficient documentation

## 2021-08-29 NOTE — Therapy (Signed)
OUTPATIENT PHYSICAL THERAPY TREATMENT NOTE   Patient Name: [name] MRN: [id number] DOB:08-18-1950, 71 y.o., female Today's Date: 08/29/2021   REFERRING PROVIDER: Einar Crow, MD   PT End of Session - 08/29/21 0909     Visit Number 7    Number of Visits 12    Date for PT Re-Evaluation 09/30/21    PT Start Time 0900    PT Stop Time 0959    PT Time Calculation (min) 59 min    Activity Tolerance Patient tolerated treatment well    Behavior During Therapy WFL for tasks assessed/performed             Past Medical History:  Diagnosis Date   Anxiety    Asthma    COPD (chronic obstructive pulmonary disease) ([place])    Hypertension    Past Surgical History:  Procedure Laterality Date   ABDOMINAL HYSTERECTOMY     BREAST SURGERY     left lumpectomy   HERNIA REPAIR     TONSILLECTOMY     Patient Active Problem List   Diagnosis Date Noted   Acute respiratory failure ([place]) 05/19/2013   Acute bronchitis 05/19/2013   HTN (hypertension) 05/19/2013    REFERRING DIAG: Encounter for other specified aftercare  THERAPY DIAG:  Stiffness of left knee, not elsewhere classified  Acute pain of left knee  Localized edema  Rationale for Evaluation and Treatment Rehabilitation  PERTINENT HISTORY: none   PRECAUTIONS: none   SUBJECTIVE: Pt arrives for today's session stating that she feels "pretty good."   PAIN:  Are you having pain? No   TODAY'S TREATMENT:                                     6/30 EXERCISE LOG  Exercise Repetitions and Resistance Comments  Recumbent bike L5 x 15 minutes    Knee flexion BLE; 30# x 3 minutes  Eccentric control  Knee extension  BLE; 10# x 3 minutes    Leg Press 2 plates x 20 reps    Rockerboard 5 mins   Step up Onto BOSU (ball up); 2 minutes; alternating LE         Blank cell = exercise not performed today   Modalities  Unattended Estim: Knee, pre-mod at 80-150 Hz, 15 mins, Pain and Edema Vaso: Knee, 34 degrees, 15  mins, Pain and Edema            PT Long Term Goals - 08/23/21 1000       PT LONG TERM GOAL #1   Title Patient will be independent with her HEP.    Time 6    Period Weeks    Status On-going    Target Date 09/19/21      PT LONG TERM GOAL #2   Title Patient will be able to demonstrate at least 120 degrees of left knee flexion for improved function navigating stairs.    Time 6    Period Weeks    Status Achieved    Target Date 09/19/21      PT LONG TERM GOAL #3   Title Patient will be able to demonstrate left active knee extension within 5 degrees of neutral for improved gait mechanics.    Time 6    Period Weeks    Status Achieved    Target Date 09/19/21      PT LONG TERM GOAL #4   Title Patient will  be able to navigate at least 4 steps with a reciprocal gait pattern for improved function navigating her household environment.    Time 6    Period Weeks    Status On-going    Target Date 09/19/21              Plan - 08/26/21 0953     Clinical Impression Statement Pt arrives for today's treatment session denying any pain.  Pt able to tolerate increased time on cybex flexion and extension machine without issue or increased time.  Pt introduced to cybex leg press today, requiring min cues for eccentric control. Normal responses to estim and vaso noted upon removal.  Pt denied any pain at completion of today's treatment session.    Personal Factors and Comorbidities Transportation;Comorbidity 1    Comorbidities HTN    Examination-Activity Limitations Locomotion Level;Transfers;Squat;Stairs;Stand    Examination-Participation Restrictions Cleaning;Community Activity;Shop    Stability/Clinical Decision Making Stable/Uncomplicated    Rehab Potential Excellent    PT Frequency 2x / week    PT Duration 6 weeks    PT Treatment/Interventions ADLs/Self Care Home Management;Cryotherapy;Electrical Stimulation;Moist Heat;Gait training;Stair training;Functional mobility  training;Therapeutic activities;Therapeutic exercise;Balance training;Neuromuscular re-education;Patient/family education;Passive range of motion;Manual techniques;Taping;Vasopneumatic Device    PT Next Visit Plan nustep, straight leg raise, heel slides, lunges, and modalites as needed    Consulted and Agree with Plan of Care Patient               Kathrynn Ducking, PTA 08/29/2021, 10:27 AM

## 2021-08-31 ENCOUNTER — Ambulatory Visit: Payer: PPO

## 2021-08-31 DIAGNOSIS — M25562 Pain in left knee: Secondary | ICD-10-CM

## 2021-08-31 DIAGNOSIS — M25662 Stiffness of left knee, not elsewhere classified: Secondary | ICD-10-CM | POA: Diagnosis not present

## 2021-08-31 DIAGNOSIS — R600 Localized edema: Secondary | ICD-10-CM

## 2021-08-31 NOTE — Therapy (Addendum)
OUTPATIENT PHYSICAL THERAPY TREATMENT NOTE   Patient Name: [name] MRN: [id number] DOB:[birth year], 71 y.o., female Today's Date: 08/31/2021   REFERRING PROVIDER: Nyra Capes, MD   PT End of Session - 08/31/21 0948     Visit Number 8    Number of Visits 12    Date for PT Re-Evaluation 09/30/21    PT Start Time 0945    PT Stop Time 1030    PT Time Calculation (min) 45 min    Activity Tolerance Patient tolerated treatment well    Behavior During Therapy WFL for tasks assessed/performed             Past Medical History:  Diagnosis Date   Anxiety    Asthma    COPD (chronic obstructive pulmonary disease) (H[facility])    Hypertension    Past Surgical History:  Procedure Laterality Date   ABDOMINAL HYSTERECTOMY     BREAST SURGERY     left lumpectomy   HERNIA REPAIR     TONSILLECTOMY     Patient Active Problem List   Diagnosis Date Noted   Acute respiratory failure (H[facility]) 05/19/2013   Acute bronchitis 05/19/2013   HTN (hypertension) 05/19/2013    REFERRING DIAG: Encounter for other specified aftercare  THERAPY DIAG:  Stiffness of left knee, not elsewhere classified  Acute pain of left knee  Localized edema  Rationale for Evaluation and Treatment Rehabilitation  PERTINENT HISTORY: none   PRECAUTIONS: none   SUBJECTIVE: Pt arrives for today's session stating that she feels "great" and denies any pain.  PAIN:  Are you having pain? No   TODAY'S TREATMENT:                                     6/30 EXERCISE LOG  Exercise Repetitions and Resistance Comments  Recumbent bike L5 x 15 minutes    Knee flexion BLE; 30# x 3 minutes  Eccentric control  Knee extension  BLE; 10# x 3 minutes    Leg Press 2 plates x 20 reps    Rockerboard 5 mins   Step up Onto BOSU (ball up); 2 minutes; alternating LE         Blank cell = exercise not performed today   Modalities  Unattended Estim: Knee, pre-mod at 80-150 Hz, 15 mins, Pain and Edema Vaso: Knee, 34  degrees, 15 mins, Pain and Edema                                     7/5 EXERCISE LOG  Exercise Repetitions and Resistance Comments  Bike Lvl 5 x 15 mins   Knee Flexion BLE; 40# x 2 mins   Knee Extension BLE; 20# x 2 mins   Leg Press 2 plates x 30 reps   Hip Flexion BLE x 20 reps   Hip Abduction         Blank cell = exercise not performed today   Modalities  Date:  Unattended Estim: Knee, IFC 80-150 Hz, 15 mins, Pain and Tone Vaso: Knee, 34 degrees, 15 mins, Pain and Tone                   PT Long Term Goals - 08/23/21 1000       PT LONG TERM GOAL #1   Title Patient will be independent with her HEP.  Time 6    Period Weeks    Status Achieved   Target Date 09/19/21      PT LONG TERM GOAL #2   Title Patient will be able to demonstrate at least 120 degrees of left knee flexion for improved function navigating stairs.    Time 6    Period Weeks    Status Achieved    Target Date 09/19/21      PT LONG TERM GOAL #3   Title Patient will be able to demonstrate left active knee extension within 5 degrees of neutral for improved gait mechanics.    Time 6    Period Weeks    Status Achieved    Target Date 09/19/21      PT LONG TERM GOAL #4   Title Patient will be able to navigate at least 4 steps with a reciprocal gait pattern for improved function navigating her household environment.    Time 6    Period Weeks    Status Achieved   Target Date 09/19/21              Plan - 08/26/21 0953     Clinical Impression Statement Pt arrives for today's treatment session denying any pain.  Pt reports that she has an MD appointment tomorrow.  Pt able to increase her FOTO to 69 today.  Pt able to tolerate increased weight with all cybex exercises today.  Pt able to navigate up and down stair with reciprocal pattern today.  Pt able to demonstrate 123 degrees of left knee flexion and -3 degrees of left knee extension. Pt has met all goals set forth for her at this time  and will continue to treating pt or discharge according to MD recommendation.  Pt denied any pain at completion of today's treatment session.    Personal Factors and Comorbidities Transportation;Comorbidity 1    Comorbidities HTN    Examination-Activity Limitations Locomotion Level;Transfers;Squat;Stairs;Stand    Examination-Participation Restrictions Cleaning;Community Activity;Shop    Stability/Clinical Decision Making Stable/Uncomplicated    Rehab Potential Excellent    PT Frequency 2x / week    PT Duration 6 weeks    PT Treatment/Interventions ADLs/Self Care Home Management;Cryotherapy;Electrical Stimulation;Moist Heat;Gait training;Stair training;Functional mobility training;Therapeutic activities;Therapeutic exercise;Balance training;Neuromuscular re-education;Patient/family education;Passive range of motion;Manual techniques;Taping;Vasopneumatic Device    PT Next Visit Plan nustep, straight leg raise, heel slides, lunges, and modalites as needed    Consulted and Agree with Plan of Care Patient            PHYSICAL THERAPY DISCHARGE SUMMARY  Visits from Start of Care: 8  Current functional level related to goals / functional outcomes: Patient was able to meet all of her goals for skilled physical therapy.   Remaining deficits: None   Education / Equipment: HEP  Patient agrees to discharge. Patient goals were met. Patient is being discharged due to meeting the stated rehab goals.  Jacqulynn Cadet, PT, DPT     Kathrynn Ducking, PTA 08/31/2021, 10:41 AM

## 2022-04-20 ENCOUNTER — Emergency Department (HOSPITAL_COMMUNITY)
Admission: EM | Admit: 2022-04-20 | Discharge: 2022-04-20 | Disposition: A | Discharge status: Home / Self Care | Payer: PPO | Attending: Emergency Medicine | Admitting: Emergency Medicine

## 2022-04-20 ENCOUNTER — Encounter (HOSPITAL_COMMUNITY): Payer: Self-pay

## 2022-04-20 ENCOUNTER — Emergency Department (HOSPITAL_COMMUNITY): Payer: PPO

## 2022-04-20 ENCOUNTER — Other Ambulatory Visit: Payer: Self-pay

## 2022-04-20 DIAGNOSIS — I10 Essential (primary) hypertension: Secondary | ICD-10-CM | POA: Diagnosis not present

## 2022-04-20 DIAGNOSIS — J449 Chronic obstructive pulmonary disease, unspecified: Secondary | ICD-10-CM | POA: Insufficient documentation

## 2022-04-20 DIAGNOSIS — Z79899 Other long term (current) drug therapy: Secondary | ICD-10-CM | POA: Insufficient documentation

## 2022-04-20 DIAGNOSIS — J45909 Unspecified asthma, uncomplicated: Secondary | ICD-10-CM | POA: Insufficient documentation

## 2022-04-20 DIAGNOSIS — N12 Tubulo-interstitial nephritis, not specified as acute or chronic: Secondary | ICD-10-CM

## 2022-04-20 DIAGNOSIS — N10 Acute tubulo-interstitial nephritis: Secondary | ICD-10-CM | POA: Diagnosis not present

## 2022-04-20 DIAGNOSIS — R300 Dysuria: Secondary | ICD-10-CM | POA: Diagnosis present

## 2022-04-20 LAB — CBC WITH DIFFERENTIAL/PLATELET
Abs Immature Granulocytes: 0.02 10*3/uL (ref 0.00–0.07)
Basophils Absolute: 0.1 10*3/uL (ref 0.0–0.1)
Basophils Relative: 1 %
Eosinophils Absolute: 0.3 10*3/uL (ref 0.0–0.5)
Eosinophils Relative: 4 %
HCT: 35.7 % — ABNORMAL LOW (ref 36.0–46.0)
Hemoglobin: 10.8 g/dL — ABNORMAL LOW (ref 12.0–15.0)
Immature Granulocytes: 0 %
Lymphocytes Relative: 30 %
Lymphs Abs: 1.9 10*3/uL (ref 0.7–4.0)
MCH: 24.2 pg — ABNORMAL LOW (ref 26.0–34.0)
MCHC: 30.3 g/dL (ref 30.0–36.0)
MCV: 79.9 fL — ABNORMAL LOW (ref 80.0–100.0)
Monocytes Absolute: 0.4 10*3/uL (ref 0.1–1.0)
Monocytes Relative: 6 %
Neutro Abs: 3.8 10*3/uL (ref 1.7–7.7)
Neutrophils Relative %: 59 %
Platelets: 214 10*3/uL (ref 150–400)
RBC: 4.47 MIL/uL (ref 3.87–5.11)
RDW: 15.4 % (ref 11.5–15.5)
WBC: 6.4 10*3/uL (ref 4.0–10.5)
nRBC: 0 % (ref 0.0–0.2)

## 2022-04-20 LAB — URINALYSIS, ROUTINE W REFLEX MICROSCOPIC
Bilirubin Urine: NEGATIVE
Glucose, UA: NEGATIVE mg/dL
Hgb urine dipstick: NEGATIVE
Ketones, ur: NEGATIVE mg/dL
Nitrite: NEGATIVE
Protein, ur: NEGATIVE mg/dL
Specific Gravity, Urine: 1.01 (ref 1.005–1.030)
pH: 6 (ref 5.0–8.0)

## 2022-04-20 LAB — BASIC METABOLIC PANEL
Anion gap: 11 (ref 5–15)
BUN: 12 mg/dL (ref 8–23)
CO2: 29 mmol/L (ref 22–32)
Calcium: 8.8 mg/dL — ABNORMAL LOW (ref 8.9–10.3)
Chloride: 97 mmol/L — ABNORMAL LOW (ref 98–111)
Creatinine, Ser: 0.73 mg/dL (ref 0.44–1.00)
GFR, Estimated: >60 mL/min (ref >60)
Glucose, Bld: 104 mg/dL — ABNORMAL HIGH (ref 70–99)
Potassium: 3.8 mmol/L (ref 3.5–5.1)
Sodium: 137 mmol/L (ref 135–145)

## 2022-04-20 MED ORDER — CEPHALEXIN 500 MG PO CAPS: 500.0000 mg | ORAL_CAPSULE | Freq: Four times a day (QID) | ORAL | 0 refills | Status: DC

## 2022-04-20 MED ORDER — CEFTRIAXONE SODIUM 1 G IJ SOLR
1.0000 g | Freq: Once | INTRAMUSCULAR | Status: AC
  Administered 2022-04-20: 1 g via INTRAMUSCULAR
  Filled 2022-04-20: qty 10

## 2022-04-20 MED ORDER — LIDOCAINE HCL (PF) 1 % IJ SOLN
INTRAMUSCULAR | Status: AC
  Administered 2022-04-20: 2 mL via INTRAMUSCULAR
  Filled 2022-04-20: qty 2

## 2022-04-20 NOTE — ED Provider Notes (Signed)
[facility] Provider Note   CSN: [id number] Arrival date & time: 04/20/22  1403     History  Chief Complaint  Patient presents with   Dysuria    [name] is a 72 y.o. female.   Dysuria Associated symptoms: flank pain   Associated symptoms: no abdominal pain, no fever, no nausea and no vomiting        [name] is a 72 y.o. female past medical history of hypertension, COPD, asthma and anxiety who presents to the Emergency Department complaining of 3-day history of right flank pain and dysuria.  States pain is radiating from her flank area into her groin.  She has pain to her back with palpation or pressure to her back such as sitting in a chair or lying supine.  She describes having a burning sensation when she urinates and some urinary hesitancy.  No history of kidney stones.  She denies any fever, chills, nausea or vomiting.  No known injury.   Home Medications Prior to Admission medications   Medication Sig Start Date End Date Taking? Authorizing Provider  albuterol (PROVENTIL HFA;VENTOLIN HFA) 108 (90 BASE) MCG/ACT inhaler Inhale 2 puffs into the lungs every 6 (six) hours as needed for wheezing or shortness of breath.    [provider]  ALPRAZolam Duanne Moron) 0.5 MG tablet Take 0.5 mg by mouth 3 (three) times daily.    [provider]  BREZTRI AEROSPHERE 160-9-4.8 MCG/ACT AERO Inhale 2 puffs into the lungs 2 (two) times daily. 03/08/21   [provider]  cephALEXin (KEFLEX) 500 MG capsule Take 1 capsule (500 mg total) by mouth 4 (four) times daily. Patient not taking: Reported on 03/24/2021 09/01/19   Fredia Sorrow, MD  cyclobenzaprine (FLEXERIL) 10 MG tablet Take 10 mg by mouth 3 (three) times daily as needed for muscle spasms. Patient not taking: Reported on 08/08/2021    [provider]  diclofenac (VOLTAREN) 50 MG EC tablet Take 50 mg by mouth 2 (two) times daily. 03/18/21    [provider]  escitalopram (LEXAPRO) 10 MG tablet Take 10 mg by mouth daily. 12/29/20   [provider]  guaiFENesin (MUCINEX) 600 MG 12 hr tablet Take 1 tablet (600 mg total) by mouth 2 (two) times daily. Patient not taking: Reported on 03/24/2021 05/20/13   Kathie Dike, MD  HYDROcodone-acetaminophen (NORCO/VICODIN) 5-325 MG tablet Take 1-2 tablets by mouth every 4 (four) hours as needed for moderate pain. 06/14/17   [provider]  ipratropium-albuterol (DUONEB) 0.5-2.5 (3) MG/3ML SOLN Take 3 mLs by nebulization every 6 (six) hours as needed (for shortness of breath/wheezing).    [provider]  levofloxacin (LEVAQUIN) 500 MG tablet Take 1 tablet (500 mg total) by mouth daily. Patient not taking: Reported on 03/24/2021 05/20/13   Kathie Dike, MD  lisinopril-hydrochlorothiazide (ZESTORETIC) 20-12.5 MG tablet Take 1 tablet by mouth daily. Patient not taking: Reported on 08/08/2021    [provider]  metoprolol succinate (TOPROL-XL) 50 MG 24 hr tablet Take 50 mg by mouth daily. Take with or immediately following a meal. Patient not taking: Reported on 08/08/2021    [provider]  naproxen (NAPROSYN) 500 MG tablet Take 1 tablet (500 mg total) by mouth 2 (two) times daily with a meal. Patient not taking: Reported on 03/24/2021 08/22/17   Sanjuana Kava, MD  omeprazole (PRILOSEC) 20 MG capsule Take 20 mg by mouth 2 (two) times daily. 03/08/21   [provider]  ondansetron (ZOFRAN ODT) 4 MG disintegrating tablet Take 1 tablet (4 mg total) by mouth every 8 (eight) hours as needed. Patient taking differently: Take 4 mg by mouth every 8 (eight) hours as needed for nausea. 09/01/19   Fredia Sorrow, MD  predniSONE (DELTASONE) 10 MG tablet Take '40mg'$  po daily for 2 days then '30mg'$  po daily for 2 days then '20mg'$  po daily for 2 days then '10mg'$  po daily for 2 days then stop Patient not taking: Reported on 03/24/2021 05/20/13   Kathie Dike, MD   traMADol (ULTRAM) 50 MG tablet Take 1 tablet (50 mg total) by mouth every 6 (six) hours as needed. Patient not taking: Reported on 03/24/2021 02/10/17   Kem Parkinson, PA-C      Allergies    Codeine and Eggs or egg-derived products    Review of Systems   Review of Systems  Constitutional:  Negative for chills and fever.  Respiratory:  Negative for shortness of breath.   Cardiovascular:  Negative for chest pain.  Gastrointestinal:  Negative for abdominal pain, nausea and vomiting.  Genitourinary:  Positive for difficulty urinating, dysuria and flank pain. Negative for hematuria.  Musculoskeletal:  Positive for back pain.  Skin:  Negative for rash.  Neurological:  Negative for weakness and headaches.    Physical Exam Updated Vital Signs BP 136/65 (BP Location: Right Arm)   Pulse 72   Temp 98 F (36.7 C) (Oral)   Resp 16   Ht 5' (1.524 m)   Wt 63.5 kg   SpO2 96%   BMI 27.34 kg/m  Physical Exam Vitals and nursing note reviewed.  Constitutional:      General: She is not in acute distress.    Appearance: Normal appearance. She is not toxic-appearing.  Cardiovascular:     Rate and Rhythm: Normal rate and regular rhythm.     Pulses: Normal pulses.  Pulmonary:     Effort: Pulmonary effort is normal.  Abdominal:     Palpations: Abdomen is soft.     Tenderness: There is no abdominal tenderness. There is no right CVA tenderness or left CVA tenderness.  Musculoskeletal:        General: Normal range of motion.     Comments: Tenderness of the right lumbar paraspinal muscles.  No midline tenderness or bony step-off.  Skin:    General: Skin is warm.     Capillary Refill: Capillary refill takes less than 2 seconds.  Neurological:     General: No focal deficit present.     Mental Status: She is alert.     Sensory: No sensory deficit.     Motor: No weakness.     ED Results / Procedures / Treatments   Labs (all labs ordered are listed, but only abnormal results are  displayed) Labs Reviewed  URINE CULTURE - Abnormal; Notable for the following components:      Result Value   Culture   (*)    Value: 10,000 COLONIES/mL GRAM NEGATIVE RODS 10,000 COLONIES/mL STREPTOCOCCUS AGALACTIAE TESTING AGAINST S. AGALACTIAE NOT ROUTINELY PERFORMED DUE TO PREDICTABILITY OF AMP/PEN/VAN SUSCEPTIBILITY. IDENTIFICATION AND SUSCEPTIBILITIES TO FOLLOW GRAM NEGATIVE RODS    All other components within normal limits  URINALYSIS, ROUTINE W REFLEX MICROSCOPIC - Abnormal; Notable for the following components:   Leukocytes,Ua TRACE (*)    Bacteria, UA RARE (*)    All other components within normal limits  CBC WITH DIFFERENTIAL/PLATELET - Abnormal; Notable for the following components:   Hemoglobin 10.8 (*)    HCT  35.7 (*)    MCV 79.9 (*)    MCH 24.2 (*)    All other components within normal limits  BASIC METABOLIC PANEL - Abnormal; Notable for the following components:   Chloride 97 (*)    Glucose, Bld 104 (*)    Calcium 8.8 (*)    All other components within normal limits    EKG None  Radiology CT Renal Stone Study  Result Date: 04/20/2022 CLINICAL DATA:  Abdominal/flank pain. Patient reports burning with urination and low back pain. EXAM: CT ABDOMEN AND PELVIS WITHOUT CONTRAST TECHNIQUE: Multidetector CT imaging of the abdomen and pelvis was performed following the standard protocol without IV contrast. RADIATION DOSE REDUCTION: This exam was performed according to the departmental dose-optimization program which includes automated exposure control, adjustment of the mA and/or kV according to patient size and/or use of iterative reconstruction technique. COMPARISON:  CT 09/01/2019 FINDINGS: Lower chest: Allowing for breathing motion artifact, no acute basilar findings. Hepatobiliary: Unremarkable unenhanced appearance of the liver. The gallbladder is decompressed. No calcified gallstone. No biliary dilatation. Pancreas: Parenchymal atrophy. No ductal dilatation or  inflammation. Spleen: Normal in size without focal abnormality. Adrenals/Urinary Tract: No adrenal nodule. No renal calculi. No hydronephrosis. No significant perinephric edema. No suspicious renal abnormality or focal lesion on this unenhanced exam. Partially distended urinary bladder. No bladder wall thickening or inflammation. No bladder stone. Stomach/Bowel: Detailed bowel assessment is limited in the absence of enteric contrast. The stomach is decompressed. There is no bowel obstruction or inflammation. Majority of the colon is nondistended limiting assessment. The appendix is not definitively seen, no evidence of appendicitis. Vascular/Lymphatic: Normal caliber abdominal aorta with minimal atherosclerosis. No enlarged lymph nodes in the abdomen or pelvis. Reproductive: Status post hysterectomy. No adnexal masses. Other: Coarse calcification in the left upper quadrant of the abdomen adjacent to the stomach is unchanged from prior exam and consistent with benign etiology. No ascites, free air or focal fluid collection. Suspected prior umbilical hernia repair. Musculoskeletal: Scoliosis and degenerative change in the spine. Degenerative changes most prominent at L3-L4 and L4-L5 with near complete disc space loss, vacuum phenomenon and Modic endplate changes. There are no acute or suspicious osseous abnormalities. No soft tissue abnormalities to account for pain. IMPRESSION: No renal stones or obstructive uropathy. No acute abnormality in the abdomen/pelvis. Aortic Atherosclerosis (ICD10-I70.0). Electronically Signed   By: Keith Rake M.D.   On: 04/20/2022 18:14     Procedures Procedures    Medications Ordered in ED Medications - No data to display  ED Course/ Medical Decision Making/ A&P                             Medical Decision Making Patient here with right flank pain and dysuria symptoms for several days.  No history of kidney stones.  Denies any fever, chills, nausea or vomiting.  No  known injury of her back.   Vital signs reassuring.  Patient ambulatory in the department with no focal neurodeficit.  Gait steady.  Differential would include but not limited to pyelonephritis, kidney stone, musculoskeletal injury.  Amount and/or Complexity of Data Reviewed Labs: ordered.    Details: Urinalysis without evidence of hematuria, trace leukocytes No leukocytosis, chemistries w/o derangement Radiology: ordered.    Details: CT renal stone study w/o obstructive uropathy Discussion of management or test interpretation with external provider(s): Urine culture pending.  Possibly developing pyelonephritis.  Pt is well appearing.  Non toxic appearing.  Rocephin  given here and rx for keflex.  Pt agreeable to close out pt f/u, referral info for urology given    Risk Prescription drug management.           Final Clinical Impression(s) / ED Diagnoses Final diagnoses:  Pyelonephritis    Rx / DC Orders ED Discharge Orders     None         Kem Parkinson, PA-C 04/22/22 2050    Fredia Sorrow, MD 04/23/22 1400

## 2022-04-20 NOTE — ED Triage Notes (Signed)
Pt reports burning with urination and low back pain for several days.

## 2022-04-20 NOTE — ED Notes (Signed)
Patient transported to CT 

## 2022-04-20 NOTE — Discharge Instructions (Signed)
Please take the antibiotic as directed until finished.  Drink plenty of water.  Follow-up with your primary care provider for recheck.  I have also listed a urologist that you may contact to arrange follow-up appointment regarding your recurrent urinary issues.

## 2022-04-23 LAB — URINE CULTURE: Culture: 10000 — AB

## 2022-04-24 ENCOUNTER — Telehealth (HOSPITAL_BASED_OUTPATIENT_CLINIC_OR_DEPARTMENT_OTHER): Payer: Self-pay

## 2022-04-24 NOTE — Telephone Encounter (Signed)
Post ED Visit - Positive Culture Follow-up  Culture report reviewed by antimicrobial stewardship pharmacist: [facility] Team '[x]'$  Eliseo Gum, [place].D. '[]'$  Heide Guile, Pharm.D., BCPS AQ-ID '[]'$  Parks Neptune, Pharm.D., BCPS '[]'$  Alycia Rossetti, Pharm.D., BCPS '[]'$  [place], Pharm.D., BCPS, AAHIVP '[]'$  Legrand Como, Pharm.D., BCPS, AAHIVP '[]'$  Salome Arnt, PharmD, BCPS '[]'$  Johnnette Gourd, PharmD, BCPS '[]'$  Hughes Better, PharmD, BCPS '[]'$  Leeroy Cha, PharmD '[]'$  Laqueta Linden, PharmD, BCPS '[]'$  Albertina Parr, PharmD  [facility] Team '[]'$  Leodis Sias, PharmD '[]'$  Lindell Spar, PharmD '[]'$  Royetta Asal, PharmD '[]'$  Graylin Shiver, Rph '[]'$  Rema Fendt) Glennon Mac, PharmD '[]'$  Arlyn Dunning, PharmD '[]'$  Netta Cedars, PharmD '[]'$  Dia Sitter, PharmD '[]'$  Leone Haven, PharmD '[]'$  Gretta Arab, PharmD '[]'$  Theodis Shove, PharmD '[]'$  Peggyann Juba, PharmD '[]'$  Reuel Boom, PharmD   Positive urine culture Treated with Cephalexin, organism sensitive to the same and no further patient follow-up is required at this time.  Glennon Hamilton 04/24/2022, 8:50 AM

## 2022-06-05 NOTE — Progress Notes (Signed)
H&P  Chief Complaint: Back pain  History of Present Illness: [name] is a 72 y.o. year old female here for follow-up of emergency room visit in February of this year.  Back pain started 2 January.  Back pain has been fairly persistent, exacerbated and relieved by motion.  Some anterior abdominal discomfort.  She states that she was treated for 2 urinary tract infections, once by her PCP in [facility] and once in the emergency room.  With antibiotics, no improvement in her back pain.  No nausea, vomiting, dysuria, lower urinary tract symptoms that are different, gross hematuria.  No fever or chills.  Past Medical History:  Diagnosis Date   Anxiety    Asthma    COPD (chronic obstructive pulmonary disease) (HCC)    Hypertension     Past Surgical History:  Procedure Laterality Date   ABDOMINAL HYSTERECTOMY     BREAST SURGERY     left lumpectomy   HERNIA REPAIR     TONSILLECTOMY      Home Medications:  (Not in a hospital admission)   Allergies:  Allergies  Allergen Reactions   Codeine Nausea And Vomiting   Egg-Derived Products     No family history on file.  Social History:  reports that she has been smoking cigarettes. She has been smoking an average of 1 pack per day. She has never used smokeless tobacco. She reports that she does not drink alcohol and does not use drugs.  ROS: A complete review of systems was performed.  All systems are negative except for pertinent findings as noted.  Physical Exam:  Vital signs in last 24 hours: @VSRANGES @ General:  Alert and oriented, No acute distress HEENT: Normocephalic, atraumatic Neck: No JVD or lymphadenopathy Cardiovascular: Regular rate  Lungs: Normal inspiratory/expiratory excursion Extremities: No edema Neurologic: Grossly intact  I have reviewed notes from referring/previous physicians--ER records  I have reviewed urinalysis results  I have independently reviewed prior imaging--CT A/P  reviewed with patient  I have reviewed prior urine culture--ER urine revealed clear urinalysis and minimal growth of E. coli and external contaminants.  Impression/Assessment:  Back pain, not of GU etiology.  Feel it is musculoskeletal and unrelated to urine which appeared normal today.  Plan:  I reassured the patient that I do not think her back pain is due to any infection.  She has normal-appearing kidneys without evidence of nephrolithiasis  I recommended that she follow-up with her PCP, Dr. Milinda Cave in [facility]  Conservative measures for her back pain.  [name] 06/05/2022, 8:21 PM  Patty Millard. [name] MD

## 2022-06-06 ENCOUNTER — Encounter: Payer: Self-pay | Admitting: Urology

## 2022-06-06 ENCOUNTER — Ambulatory Visit (INDEPENDENT_AMBULATORY_CARE_PROVIDER_SITE_OTHER): Payer: PPO | Admitting: Urology

## 2022-06-06 VITALS — BP 149/55 | HR 79

## 2022-06-06 DIAGNOSIS — N390 Urinary tract infection, site not specified: Secondary | ICD-10-CM

## 2022-06-06 DIAGNOSIS — Z87440 Personal history of urinary (tract) infections: Secondary | ICD-10-CM

## 2022-06-06 DIAGNOSIS — M549 Dorsalgia, unspecified: Secondary | ICD-10-CM | POA: Diagnosis not present

## 2022-06-06 LAB — URINALYSIS, ROUTINE W REFLEX MICROSCOPIC
Bilirubin, UA: NEGATIVE
Glucose, UA: NEGATIVE
Ketones, UA: NEGATIVE
Leukocytes,UA: NEGATIVE
Nitrite, UA: NEGATIVE
Protein,UA: NEGATIVE
RBC, UA: NEGATIVE
Specific Gravity, UA: 1.015 (ref 1.005–1.030)
Urobilinogen, Ur: 0.2 mg/dL (ref 0.2–1.0)
pH, UA: 6 (ref 5.0–7.5)

## 2022-10-06 IMAGING — DX DG KNEE COMPLETE 4+V*L*
4 series · 4 of 4 positions shown · non-contrast
Comparison: None.

CLINICAL DATA: Left knee pain and swelling

EXAM:
LEFT KNEE - COMPLETE 4+ VIEW

[knee ap]
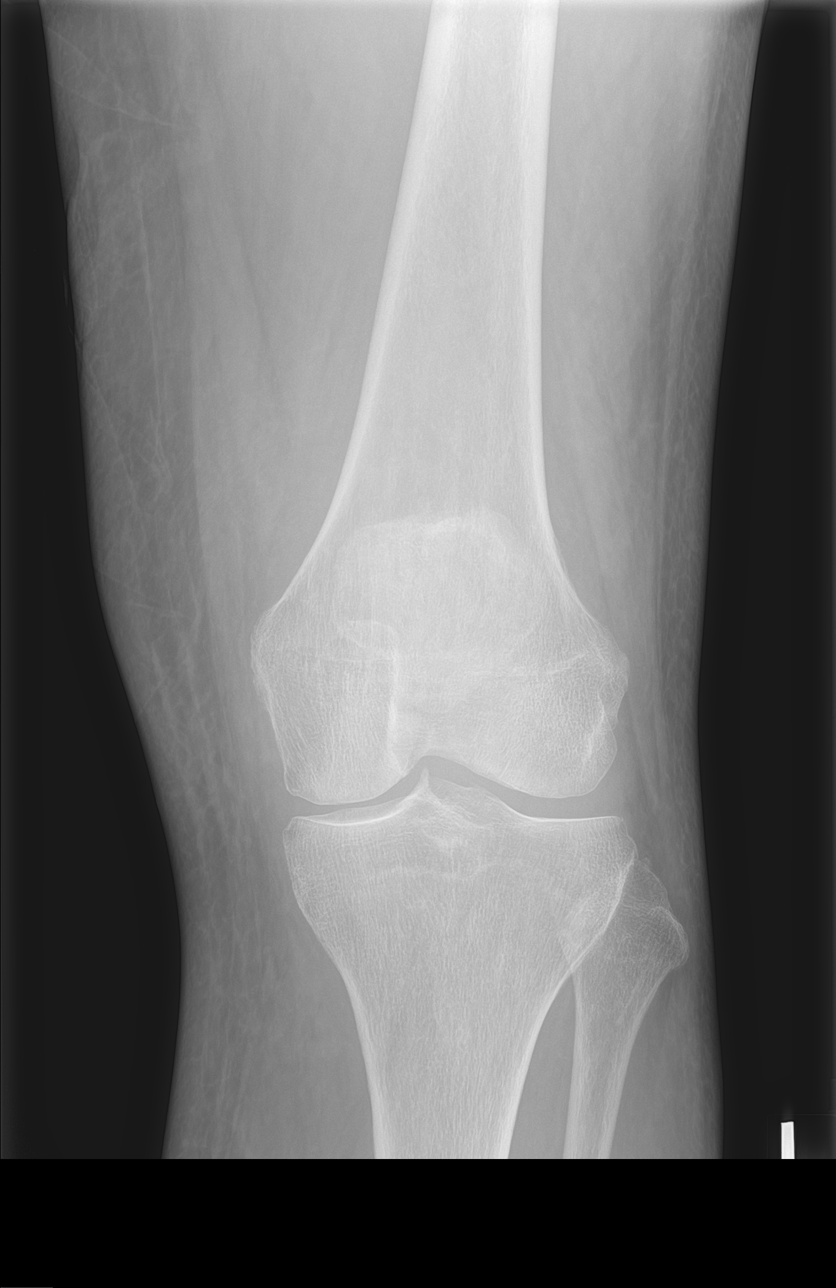

[knee obl (1 of 2)]
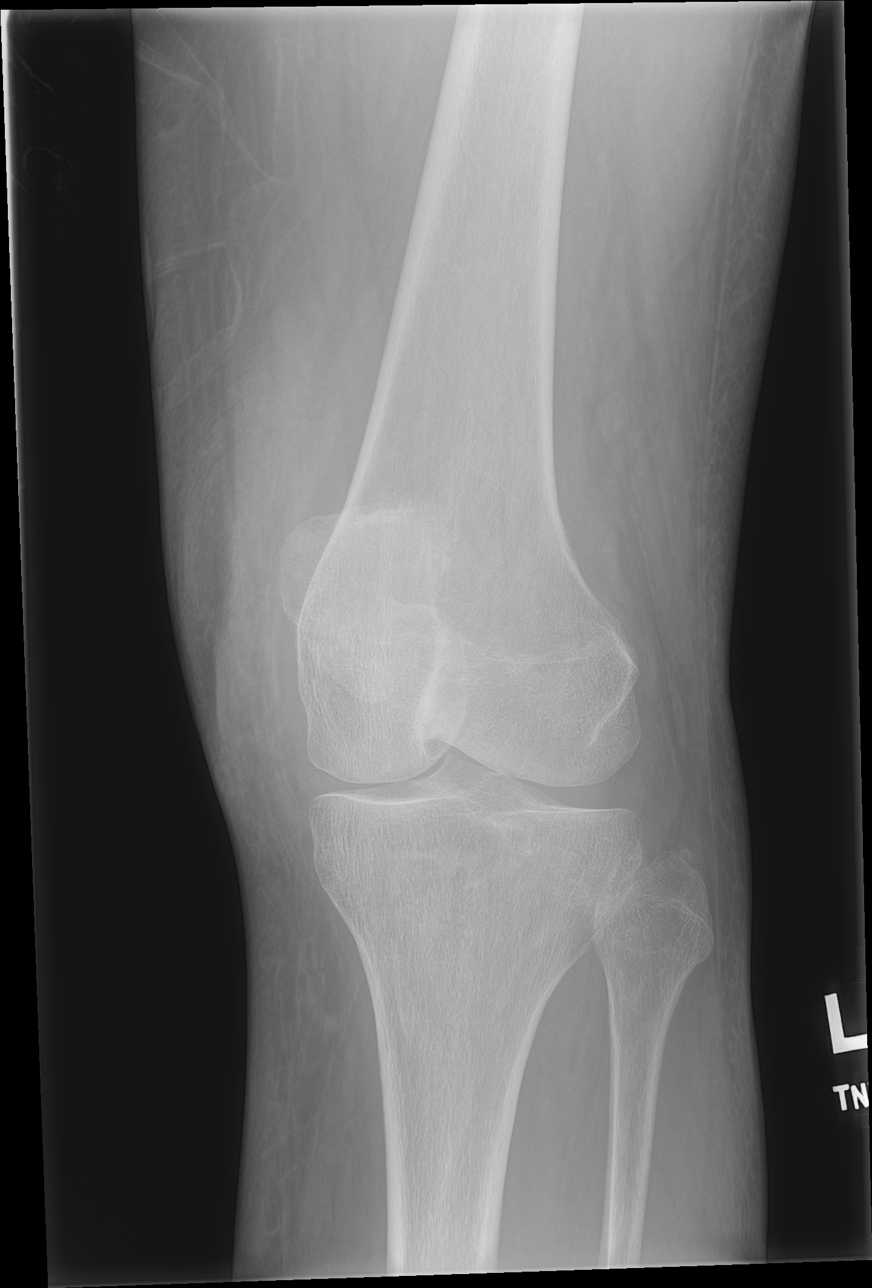

[knee obl (2 of 2)]
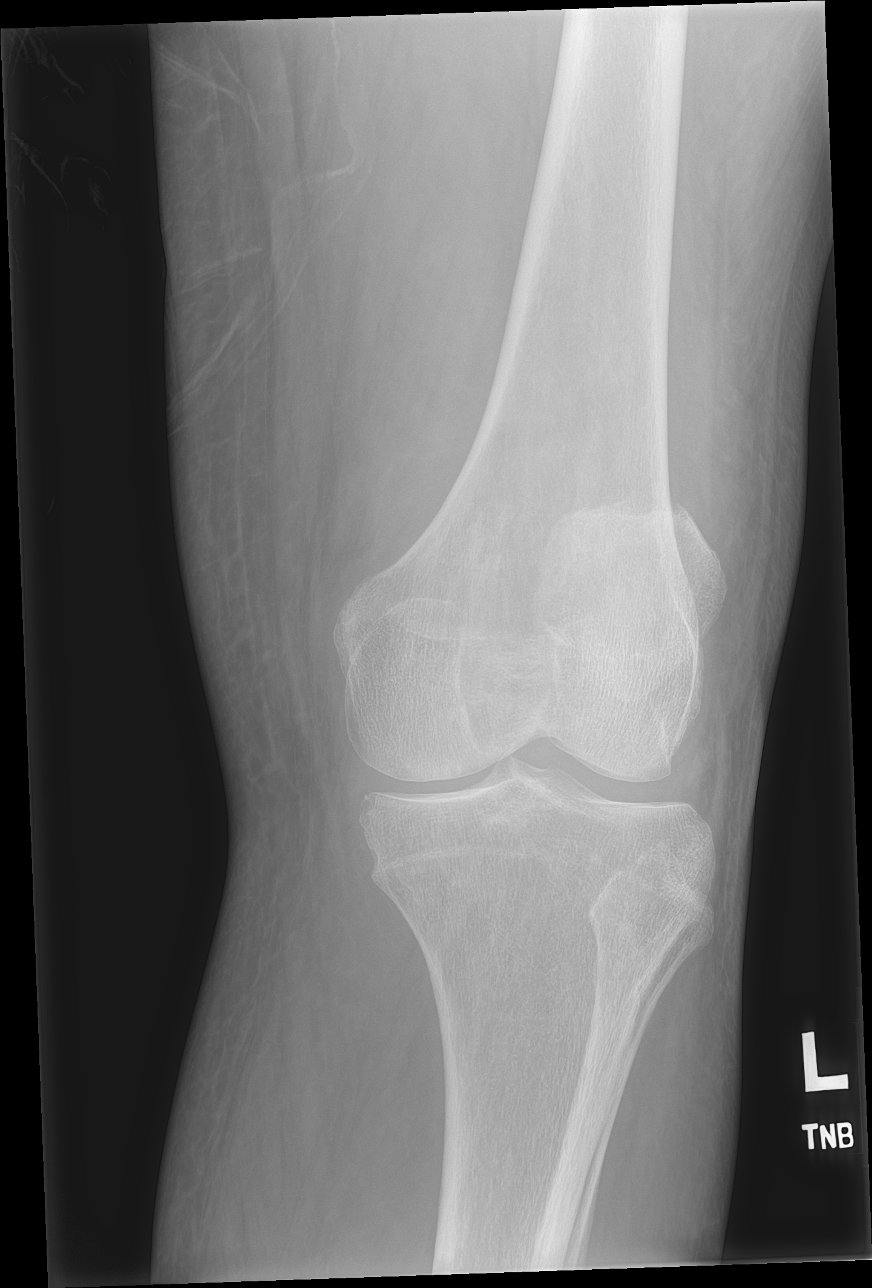

[knee lat]
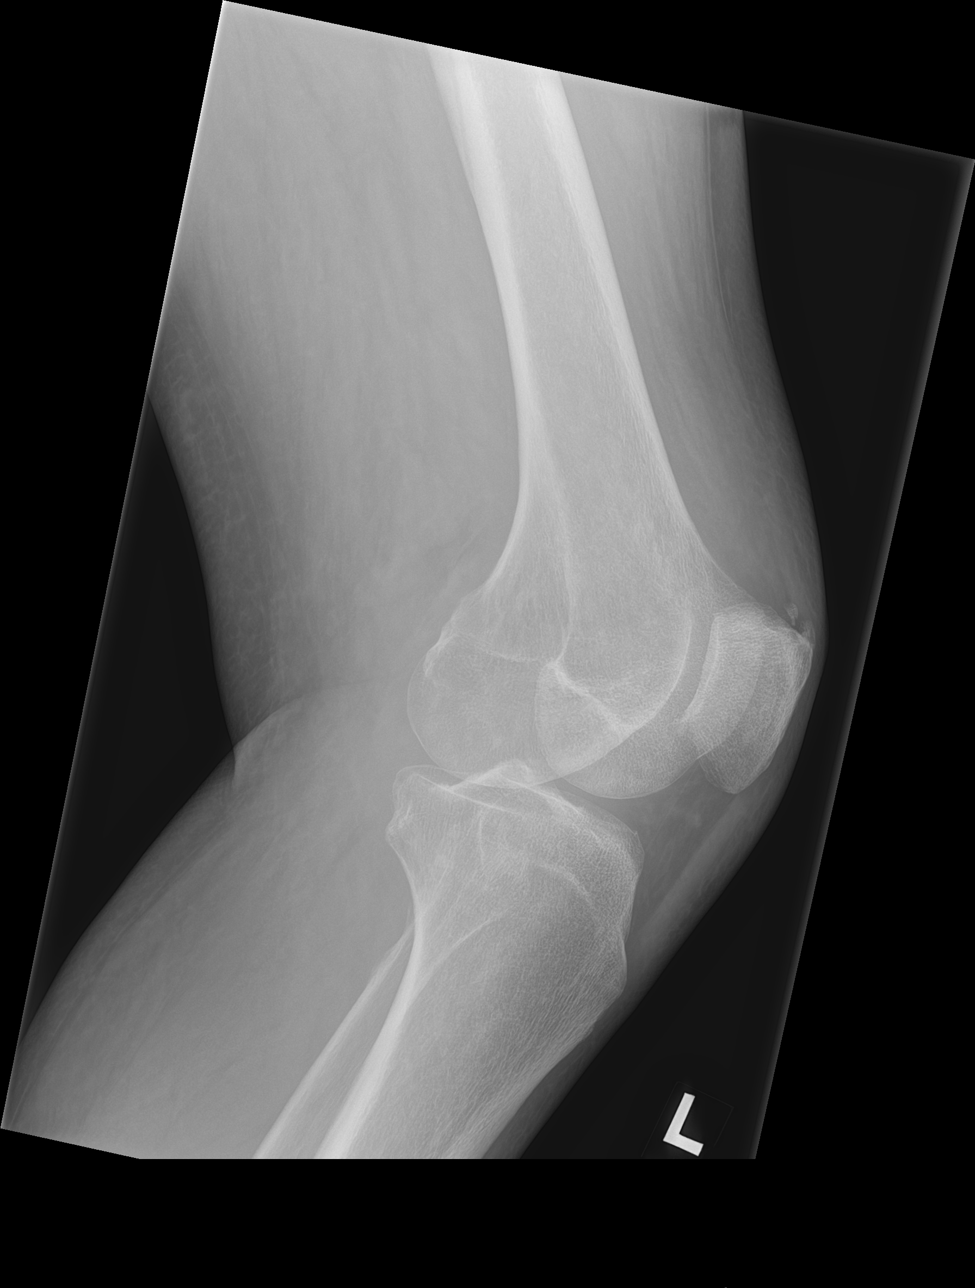

[4 of 4 positions shown; findings below may reference images not displayed]

FINDINGS: No evidence of fracture, dislocation, or joint effusion. Mild medial
compartment joint space narrowing. Mild enthesopathy at the
quadriceps tendon insertion. Edematous appearance of the soft
tissues.
IMPRESSION: 1. No acute osseous findings. Mild medial compartment joint space
narrowing.
2. Edematous appearance of the soft tissues.

## 2022-10-06 IMAGING — US US EXTREM LOW VENOUS*L*
1 series · 14 of 24 positions shown · non-contrast
Comparison: None.

CLINICAL DATA: Pain and edema x4 days

EXAM:
LEFT LOWER EXTREMITY VENOUS DOPPLER ULTRASOUND
TECHNIQUE: Gray-scale sonography with compression, as well as color and duplex
ultrasound, were performed to evaluate the deep venous system(s)
from the level of the common femoral vein through the popliteal and
proximal calf veins.

[Series 1: us venous img lower uni left (dvt) · portal-venous · 14 of 54 slices shown]
[im 1/54]
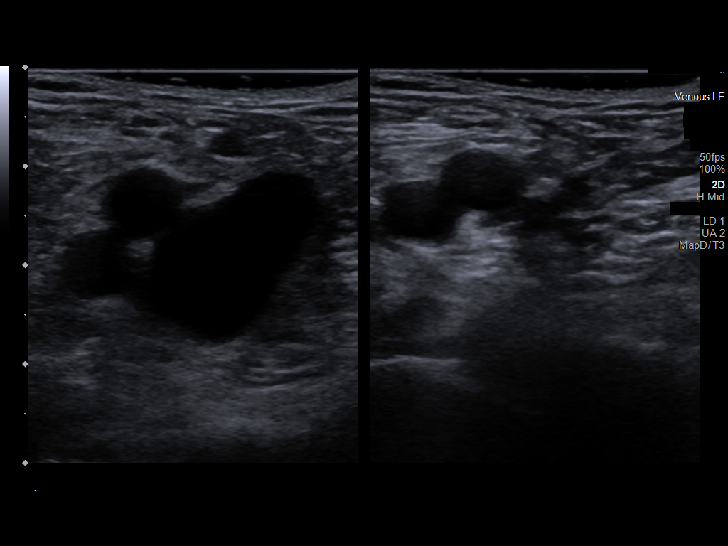
[im 5/54]
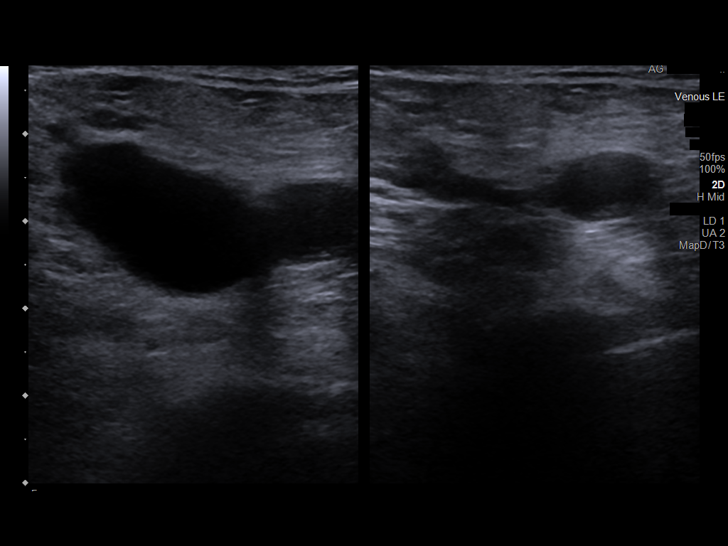
[im 10/54]
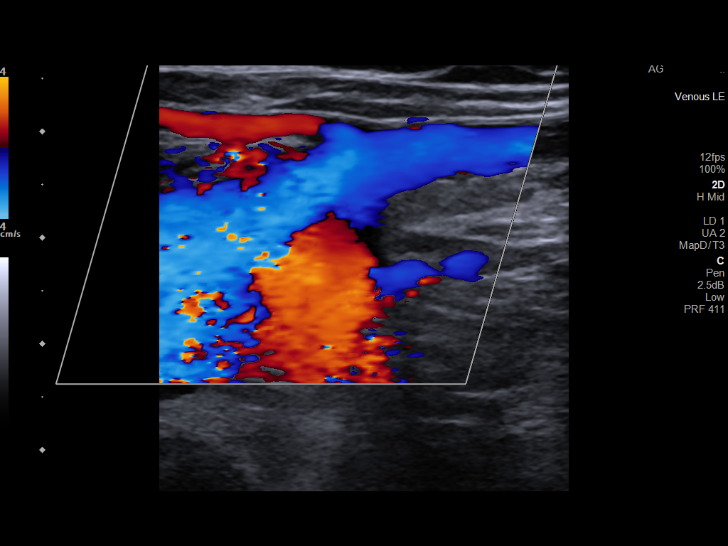
[im 14/54]
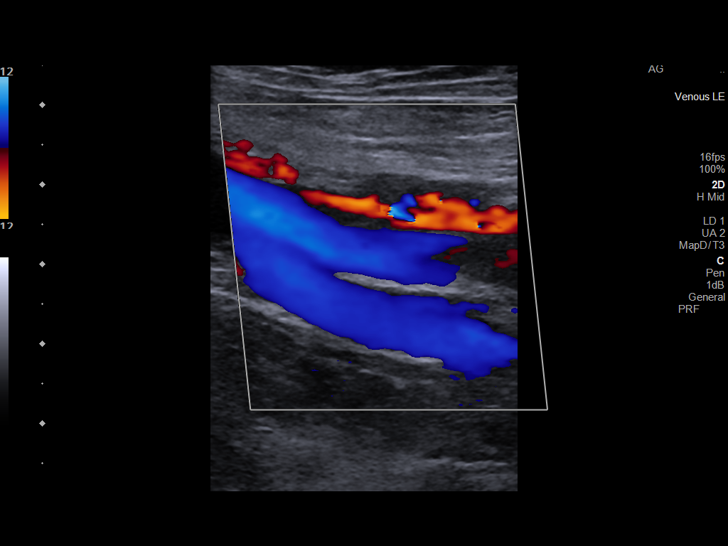
[im 17/54]
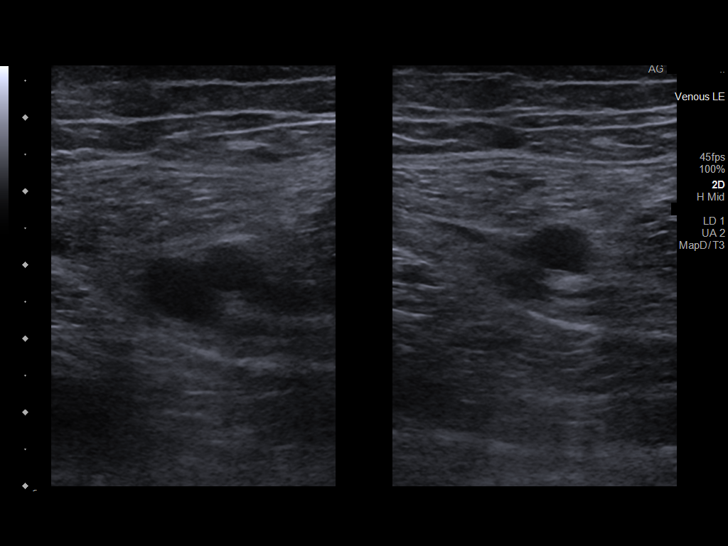
[im 21/54]
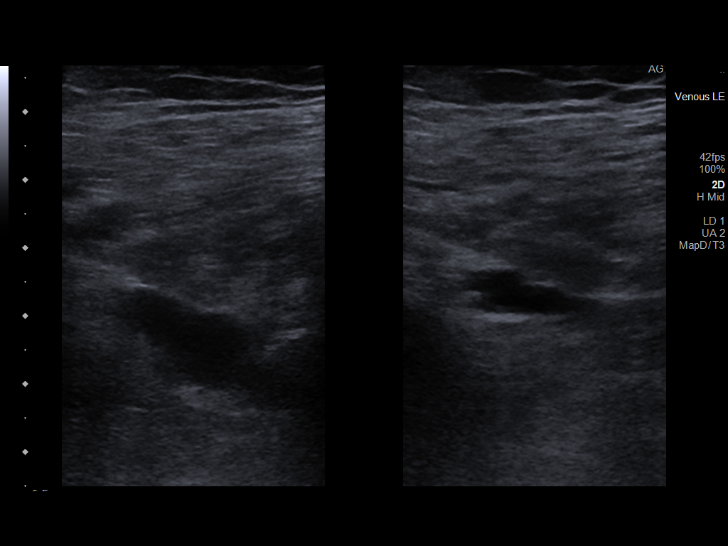
[im 26/54]
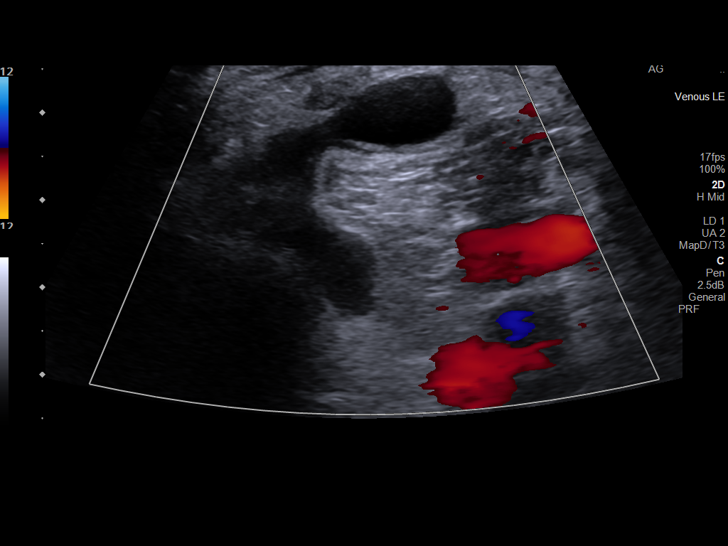
[im 28/54]
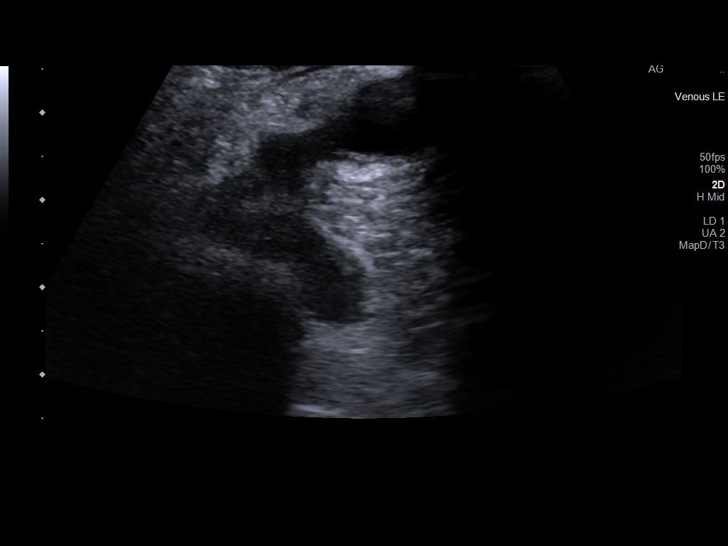
[im 33/54]
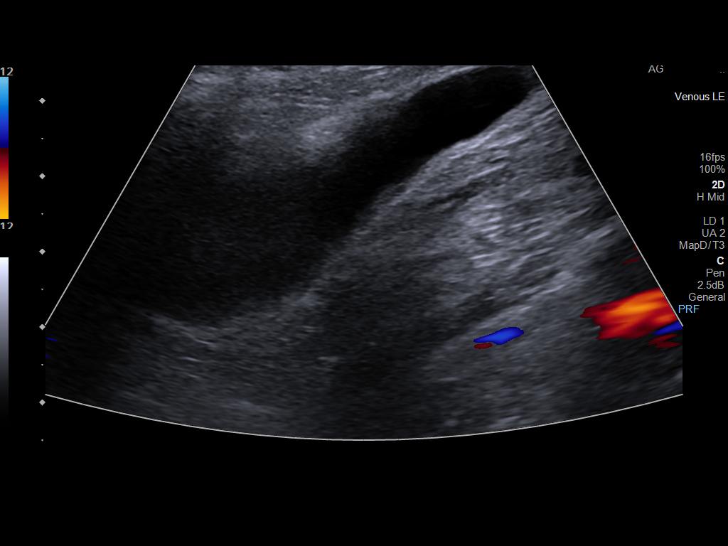
[im 37/54]
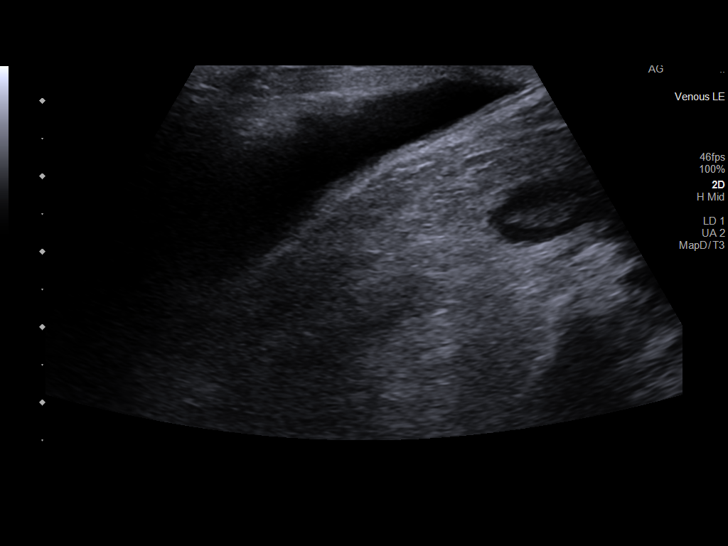
[im 42/54]
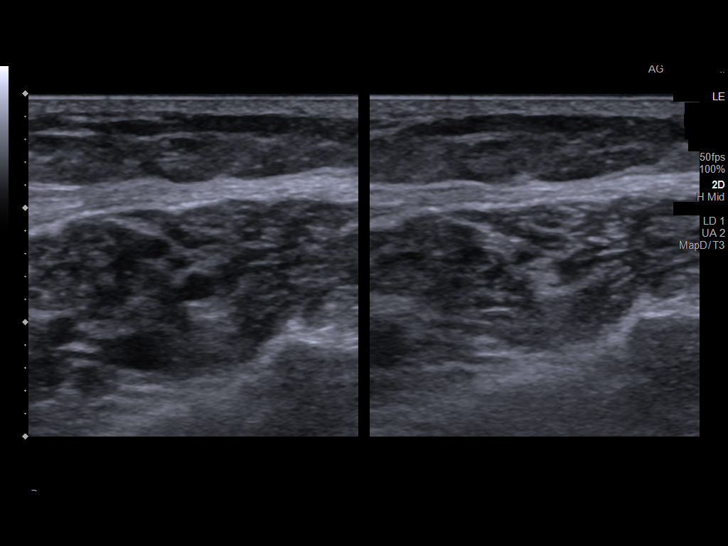
[im 44/54]
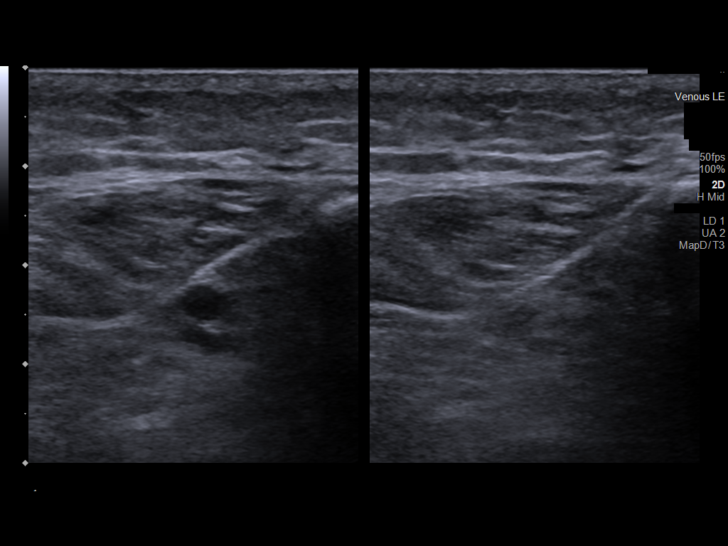
[im 49/54]
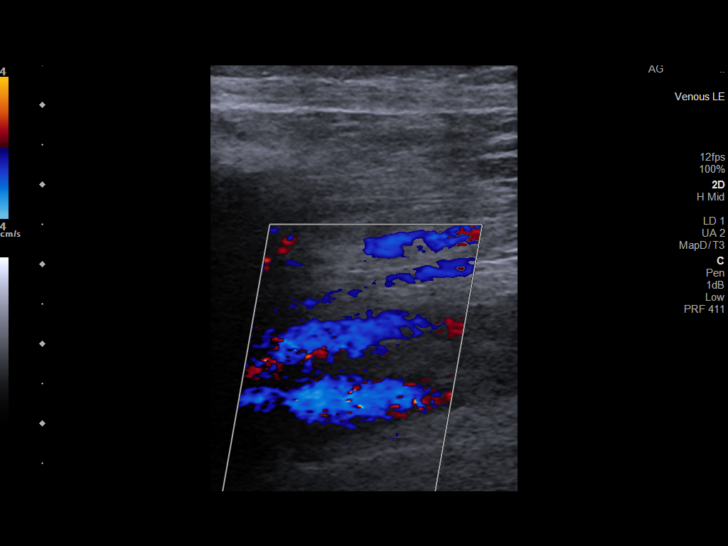
[im 54/54]
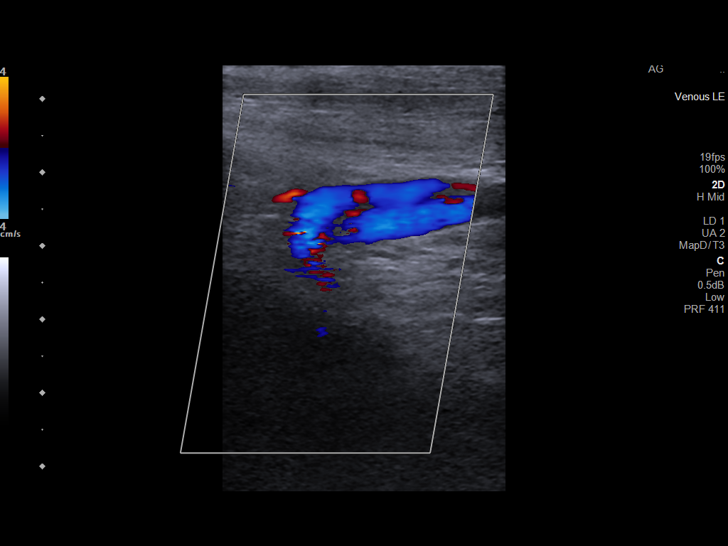

[14 of 24 positions shown; findings below may reference images not displayed]

FINDINGS: VENOUS

Normal compressibility of the common femoral, superficial femoral,
and popliteal veins, as well as the visualized calf veins.
Visualized portions of profunda femoral vein and great saphenous
vein unremarkable. No filling defects to suggest DVT on grayscale or
color Doppler imaging. Doppler waveforms show normal direction of
venous flow, normal respiratory phasicity and response to
augmentation.

Limited views of the contralateral common femoral vein are
unremarkable.

OTHER

Elongated 6.5 cm cystic collection in the posterior popliteal fossa.

Limitations: none
IMPRESSION: 1. Negative for left lower extremity DVT.
2. Left [name]'s cyst

## 2023-04-14 ENCOUNTER — Emergency Department (HOSPITAL_COMMUNITY): Payer: PPO

## 2023-04-14 ENCOUNTER — Encounter (HOSPITAL_COMMUNITY): Payer: Self-pay

## 2023-04-14 ENCOUNTER — Inpatient Hospital Stay (HOSPITAL_COMMUNITY)
Admission: EM | Admit: 2023-04-14 | Discharge: 2023-04-17 | DRG: 190 | Disposition: A | Discharge status: Home / Self Care | Payer: PPO | Attending: Family Medicine | Admitting: Family Medicine

## 2023-04-14 ENCOUNTER — Other Ambulatory Visit: Payer: Self-pay

## 2023-04-14 DIAGNOSIS — Z1152 Encounter for screening for COVID-19: Secondary | ICD-10-CM

## 2023-04-14 DIAGNOSIS — F419 Anxiety disorder, unspecified: Secondary | ICD-10-CM | POA: Diagnosis present

## 2023-04-14 DIAGNOSIS — K859 Acute pancreatitis without necrosis or infection, unspecified: Secondary | ICD-10-CM | POA: Diagnosis present

## 2023-04-14 DIAGNOSIS — R9431 Abnormal electrocardiogram [ECG] [EKG]: Secondary | ICD-10-CM | POA: Insufficient documentation

## 2023-04-14 DIAGNOSIS — F32A Depression, unspecified: Secondary | ICD-10-CM | POA: Diagnosis present

## 2023-04-14 DIAGNOSIS — Z90710 Acquired absence of both cervix and uterus: Secondary | ICD-10-CM | POA: Diagnosis not present

## 2023-04-14 DIAGNOSIS — K219 Gastro-esophageal reflux disease without esophagitis: Secondary | ICD-10-CM | POA: Diagnosis present

## 2023-04-14 DIAGNOSIS — D509 Iron deficiency anemia, unspecified: Secondary | ICD-10-CM | POA: Diagnosis present

## 2023-04-14 DIAGNOSIS — J441 Chronic obstructive pulmonary disease with (acute) exacerbation: Secondary | ICD-10-CM | POA: Diagnosis present

## 2023-04-14 DIAGNOSIS — Z79899 Other long term (current) drug therapy: Secondary | ICD-10-CM

## 2023-04-14 DIAGNOSIS — R8271 Bacteriuria: Secondary | ICD-10-CM | POA: Diagnosis present

## 2023-04-14 DIAGNOSIS — Z885 Allergy status to narcotic agent status: Secondary | ICD-10-CM

## 2023-04-14 DIAGNOSIS — F17210 Nicotine dependence, cigarettes, uncomplicated: Secondary | ICD-10-CM | POA: Diagnosis present

## 2023-04-14 DIAGNOSIS — J9601 Acute respiratory failure with hypoxia: Secondary | ICD-10-CM | POA: Diagnosis present

## 2023-04-14 DIAGNOSIS — Z91012 Allergy to eggs: Secondary | ICD-10-CM | POA: Diagnosis not present

## 2023-04-14 DIAGNOSIS — E871 Hypo-osmolality and hyponatremia: Secondary | ICD-10-CM | POA: Diagnosis present

## 2023-04-14 DIAGNOSIS — R8281 Pyuria: Secondary | ICD-10-CM | POA: Diagnosis present

## 2023-04-14 DIAGNOSIS — E861 Hypovolemia: Secondary | ICD-10-CM | POA: Diagnosis present

## 2023-04-14 DIAGNOSIS — K8500 Idiopathic acute pancreatitis without necrosis or infection: Secondary | ICD-10-CM | POA: Diagnosis not present

## 2023-04-14 DIAGNOSIS — Z716 Tobacco abuse counseling: Secondary | ICD-10-CM

## 2023-04-14 DIAGNOSIS — I119 Hypertensive heart disease without heart failure: Secondary | ICD-10-CM | POA: Diagnosis present

## 2023-04-14 LAB — CBC WITH DIFFERENTIAL/PLATELET
Abs Immature Granulocytes: 0.05 10*3/uL (ref 0.00–0.07)
Basophils Absolute: 0.1 10*3/uL (ref 0.0–0.1)
Basophils Relative: 1 %
Eosinophils Absolute: 0.3 10*3/uL (ref 0.0–0.5)
Eosinophils Relative: 2 %
HCT: 29.7 % — ABNORMAL LOW (ref 36.0–46.0)
Hemoglobin: 8.7 g/dL — ABNORMAL LOW (ref 12.0–15.0)
Immature Granulocytes: 0 %
Lymphocytes Relative: 21 %
Lymphs Abs: 2.3 10*3/uL (ref 0.7–4.0)
MCH: 20 pg — ABNORMAL LOW (ref 26.0–34.0)
MCHC: 29.3 g/dL — ABNORMAL LOW (ref 30.0–36.0)
MCV: 68.4 fL — ABNORMAL LOW (ref 80.0–100.0)
Monocytes Absolute: 0.9 10*3/uL (ref 0.1–1.0)
Monocytes Relative: 8 %
Neutro Abs: 7.6 10*3/uL (ref 1.7–7.7)
Neutrophils Relative %: 68 %
Platelets: 368 10*3/uL (ref 150–400)
RBC: 4.34 MIL/uL (ref 3.87–5.11)
RDW: 17.4 % — ABNORMAL HIGH (ref 11.5–15.5)
WBC: 11.3 10*3/uL — ABNORMAL HIGH (ref 4.0–10.5)
nRBC: 0 % (ref 0.0–0.2)

## 2023-04-14 LAB — RESP PANEL BY RT-PCR (RSV, FLU A&B, COVID)  RVPGX2
Influenza A by PCR: NEGATIVE
Influenza B by PCR: NEGATIVE
Resp Syncytial Virus by PCR: NEGATIVE
SARS Coronavirus 2 by RT PCR: NEGATIVE

## 2023-04-14 LAB — COMPREHENSIVE METABOLIC PANEL
ALT: 13 U/L (ref 0–44)
AST: 11 U/L — ABNORMAL LOW (ref 15–41)
Albumin: 3.8 g/dL (ref 3.5–5.0)
Alkaline Phosphatase: 63 U/L (ref 38–126)
Anion gap: 10 (ref 5–15)
BUN: 12 mg/dL (ref 8–23)
CO2: 27 mmol/L (ref 22–32)
Calcium: 8.1 mg/dL — ABNORMAL LOW (ref 8.9–10.3)
Chloride: 83 mmol/L — ABNORMAL LOW (ref 98–111)
Creatinine, Ser: 0.55 mg/dL (ref 0.44–1.00)
GFR, Estimated: >60 mL/min (ref >60)
Glucose, Bld: 140 mg/dL — ABNORMAL HIGH (ref 70–99)
Potassium: 3.7 mmol/L (ref 3.5–5.1)
Sodium: 120 mmol/L — ABNORMAL LOW (ref 135–145)
Total Bilirubin: 0.6 mg/dL (ref 0.0–1.2)
Total Protein: 6.4 g/dL — ABNORMAL LOW (ref 6.5–8.1)

## 2023-04-14 LAB — SODIUM, URINE, RANDOM: Sodium, Ur: 86 mmol/L

## 2023-04-14 LAB — CREATININE, URINE, RANDOM: Creatinine, Urine: 71 mg/dL

## 2023-04-14 LAB — MRSA NEXT GEN BY PCR, NASAL: MRSA by PCR Next Gen: NOT DETECTED

## 2023-04-14 LAB — BRAIN NATRIURETIC PEPTIDE: B Natriuretic Peptide: 41 pg/mL (ref 0.0–100.0)

## 2023-04-14 LAB — MAGNESIUM: Magnesium: <0.5 mg/dL — CL (ref 1.7–2.4)

## 2023-04-14 LAB — LIPASE, BLOOD: Lipase: 71 U/L — ABNORMAL HIGH (ref 11–51)

## 2023-04-14 MED ORDER — IPRATROPIUM-ALBUTEROL 0.5-2.5 (3) MG/3ML IN SOLN
3.0000 mL | Freq: Once | RESPIRATORY_TRACT | Status: AC
  Administered 2023-04-14: 3 mL via RESPIRATORY_TRACT
  Filled 2023-04-14: qty 3

## 2023-04-14 MED ORDER — PROCHLORPERAZINE EDISYLATE 10 MG/2ML IJ SOLN
5.0000 mg | Freq: Once | INTRAMUSCULAR | Status: AC
  Administered 2023-04-14: 5 mg via INTRAVENOUS
  Filled 2023-04-14: qty 2

## 2023-04-14 MED ORDER — METHYLPREDNISOLONE SODIUM SUCC 40 MG IJ SOLR
40.0000 mg | Freq: Two times a day (BID) | INTRAMUSCULAR | Status: DC
  Administered 2023-04-15 – 2023-04-17 (×5): 40 mg via INTRAVENOUS
  Filled 2023-04-14 (×5): qty 1

## 2023-04-14 MED ORDER — MAGNESIUM SULFATE 2 GM/50ML IV SOLN
2.0000 g | Freq: Once | INTRAVENOUS | Status: DC
  Filled 2023-04-14: qty 50

## 2023-04-14 MED ORDER — ALBUTEROL SULFATE (2.5 MG/3ML) 0.083% IN NEBU
INHALATION_SOLUTION | RESPIRATORY_TRACT | Status: AC
  Filled 2023-04-14: qty 3

## 2023-04-14 MED ORDER — CHLORHEXIDINE GLUCONATE CLOTH 2 % EX PADS
6.0000 | MEDICATED_PAD | Freq: Every day | CUTANEOUS | Status: DC
  Administered 2023-04-14 – 2023-04-17 (×4): 6 via TOPICAL

## 2023-04-14 MED ORDER — DM-GUAIFENESIN ER 30-600 MG PO TB12
1.0000 | ORAL_TABLET | Freq: Two times a day (BID) | ORAL | Status: DC
  Administered 2023-04-14 – 2023-04-17 (×6): 1 via ORAL
  Filled 2023-04-14 (×6): qty 1

## 2023-04-14 MED ORDER — ACETAMINOPHEN 325 MG PO TABS
650.0000 mg | ORAL_TABLET | Freq: Four times a day (QID) | ORAL | Status: DC | PRN
  Administered 2023-04-14 – 2023-04-16 (×3): 650 mg via ORAL
  Filled 2023-04-14 (×3): qty 2

## 2023-04-14 MED ORDER — IPRATROPIUM-ALBUTEROL 0.5-2.5 (3) MG/3ML IN SOLN
3.0000 mL | Freq: Four times a day (QID) | RESPIRATORY_TRACT | Status: DC
  Filled 2023-04-14: qty 3

## 2023-04-14 MED ORDER — LEVALBUTEROL HCL 0.63 MG/3ML IN NEBU
0.6300 mg | INHALATION_SOLUTION | Freq: Four times a day (QID) | RESPIRATORY_TRACT | Status: DC
  Administered 2023-04-14 – 2023-04-15 (×2): 0.63 mg via RESPIRATORY_TRACT
  Filled 2023-04-14: qty 3

## 2023-04-14 MED ORDER — IPRATROPIUM-ALBUTEROL 0.5-2.5 (3) MG/3ML IN SOLN: 3.0000 mL | RESPIRATORY_TRACT | Status: DC | PRN

## 2023-04-14 MED ORDER — IOHEXOL 300 MG/ML  SOLN
100.0000 mL | Freq: Once | INTRAMUSCULAR | Status: AC | PRN
  Administered 2023-04-14: 100 mL via INTRAVENOUS

## 2023-04-14 MED ORDER — PROCHLORPERAZINE EDISYLATE 10 MG/2ML IJ SOLN
10.0000 mg | Freq: Four times a day (QID) | INTRAMUSCULAR | Status: DC | PRN
Start: 2023-04-14 — End: 2023-04-15
  Administered 2023-04-14 – 2023-04-15 (×2): 10 mg via INTRAVENOUS
  Filled 2023-04-14 (×2): qty 2

## 2023-04-14 MED ORDER — AZITHROMYCIN 250 MG PO TABS: 500.0000 mg | ORAL_TABLET | Freq: Every day | ORAL | Status: DC

## 2023-04-14 MED ORDER — AZITHROMYCIN 250 MG PO TABS
250.0000 mg | ORAL_TABLET | Freq: Every day | ORAL | Status: DC
Start: 2023-04-15 — End: 2023-04-14

## 2023-04-14 MED ORDER — ESCITALOPRAM OXALATE 10 MG PO TABS
20.0000 mg | ORAL_TABLET | Freq: Every day | ORAL | Status: DC
  Administered 2023-04-14 – 2023-04-17 (×4): 20 mg via ORAL
  Filled 2023-04-14 (×4): qty 2

## 2023-04-14 MED ORDER — AZITHROMYCIN 250 MG PO TABS
500.0000 mg | ORAL_TABLET | Freq: Every day | ORAL | Status: AC
  Administered 2023-04-15: 500 mg via ORAL
  Filled 2023-04-14: qty 2

## 2023-04-14 MED ORDER — SODIUM CHLORIDE 0.9 % IV BOLUS
1000.0000 mL | Freq: Once | INTRAVENOUS | Status: AC
  Administered 2023-04-14: 1000 mL via INTRAVENOUS

## 2023-04-14 MED ORDER — ENOXAPARIN SODIUM 40 MG/0.4ML IJ SOSY
40.0000 mg | PREFILLED_SYRINGE | INTRAMUSCULAR | Status: DC
  Administered 2023-04-14 – 2023-04-16 (×3): 40 mg via SUBCUTANEOUS
  Filled 2023-04-14 (×3): qty 0.4

## 2023-04-14 MED ORDER — IPRATROPIUM BROMIDE 0.02 % IN SOLN
0.5000 mg | Freq: Four times a day (QID) | RESPIRATORY_TRACT | Status: DC
  Administered 2023-04-15 (×2): 0.5 mg via RESPIRATORY_TRACT
  Filled 2023-04-14 (×2): qty 2.5

## 2023-04-14 MED ORDER — MAGNESIUM SULFATE 4 GM/100ML IV SOLN
4.0000 g | Freq: Once | INTRAVENOUS | Status: AC
  Administered 2023-04-14: 4 g via INTRAVENOUS
  Filled 2023-04-14: qty 100

## 2023-04-14 MED ORDER — DIPHENHYDRAMINE HCL 50 MG/ML IJ SOLN
12.5000 mg | Freq: Once | INTRAMUSCULAR | Status: AC
  Administered 2023-04-14: 12.5 mg via INTRAVENOUS
  Filled 2023-04-14: qty 1

## 2023-04-14 MED ORDER — METHYLPREDNISOLONE SODIUM SUCC 125 MG IJ SOLR: 80.0000 mg | Freq: Every day | INTRAMUSCULAR | Status: DC

## 2023-04-14 MED ORDER — GUAIFENESIN-DM 100-10 MG/5ML PO SYRP: 5.0000 mL | ORAL_SOLUTION | ORAL | Status: DC | PRN

## 2023-04-14 MED ORDER — ACETAMINOPHEN 650 MG RE SUPP: 650.0000 mg | Freq: Four times a day (QID) | RECTAL | Status: DC | PRN

## 2023-04-14 MED ORDER — AZITHROMYCIN 250 MG PO TABS
250.0000 mg | ORAL_TABLET | Freq: Every day | ORAL | Status: DC
  Administered 2023-04-16 – 2023-04-17 (×2): 250 mg via ORAL
  Filled 2023-04-14 (×2): qty 1

## 2023-04-14 MED ORDER — CALCIUM CARBONATE 1250 (500 CA) MG PO TABS
1.0000 | ORAL_TABLET | Freq: Every day | ORAL | Status: DC
  Administered 2023-04-15 – 2023-04-17 (×3): 1250 mg via ORAL
  Filled 2023-04-14 (×3): qty 1

## 2023-04-14 MED ORDER — LEVALBUTEROL HCL 0.63 MG/3ML IN NEBU
INHALATION_SOLUTION | RESPIRATORY_TRACT | Status: AC
  Filled 2023-04-14: qty 3

## 2023-04-14 MED ORDER — SODIUM CHLORIDE 0.9 % IV SOLN
500.0000 mg | Freq: Once | INTRAVENOUS | Status: AC
  Administered 2023-04-14: 500 mg via INTRAVENOUS
  Filled 2023-04-14: qty 5

## 2023-04-14 MED ORDER — METHYLPREDNISOLONE SODIUM SUCC 125 MG IJ SOLR
125.0000 mg | Freq: Once | INTRAMUSCULAR | Status: AC
  Administered 2023-04-14: 125 mg via INTRAVENOUS
  Filled 2023-04-14: qty 2

## 2023-04-14 MED ORDER — PANTOPRAZOLE SODIUM 40 MG PO TBEC
40.0000 mg | DELAYED_RELEASE_TABLET | Freq: Every day | ORAL | Status: DC
  Administered 2023-04-14 – 2023-04-17 (×4): 40 mg via ORAL
  Filled 2023-04-14 (×4): qty 1

## 2023-04-14 NOTE — ED Notes (Signed)
Patient finished breathing treatment. 5 minutes later, patient room air o2 saturation noted to be 87%. O2 administered at 2lpm via [place]

## 2023-04-14 NOTE — ED Triage Notes (Addendum)
Patient BIB [facility] from home for Complaint of of chest and abdominal pain believed to be from cough 92% Room air. 4 mg of zofran for vomiting in transport and a ned treatment given.

## 2023-04-14 NOTE — ED Provider Notes (Signed)
[place] [facility] AT [facility] Provider Note   CSN: [id number] Arrival date & time: 04/14/23  1610     History  Chief Complaint  Patient presents with   Cough   Abdominal Pain   Chest Pain   Respiratory Distress    [name] is a 73 y.o. female.  73 year old female with past medical history of COPD and asthma presenting to the emergency department today with cough and shortness of breath.  Patient that she has been feeling unwell now for the last 4 days.  She reports that she is starting to have some pain in her upper abdomen mostly with coughing.  States that it is sore now even when she is not coughing but is much worse with coughing.  She has been coughing up minimal sputum with this.  She denies any fevers but has had some chills.  She has had 2 episodes of posttussive emesis today.  This was nonbloody and nonbilious.  There patient's pain is nonpleuritic.  She is initially hypoxic on arrival with medics and received a DuoNeb treatment with some improvement.   Cough Associated symptoms: chest pain   Abdominal Pain Associated symptoms: chest pain and cough   Chest Pain Associated symptoms: abdominal pain and cough        Home Medications Prior to Admission medications   Medication Sig Start Date End Date Taking? Authorizing Provider  albuterol (PROVENTIL HFA;VENTOLIN HFA) 108 (90 BASE) MCG/ACT inhaler Inhale 2 puffs into the lungs every 6 (six) hours as needed for wheezing or shortness of breath.   Yes [provider]  ALPRAZolam Prudy Feeler) 0.5 MG tablet Take 0.5 mg by mouth 2 (two) times daily.   Yes [provider]  BREZTRI AEROSPHERE 160-9-4.8 MCG/ACT AERO Inhale 2 puffs into the lungs 2 (two) times daily. 03/08/21  Yes [provider]  cefdinir (OMNICEF) 300 MG capsule Take 300 mg by mouth 2 (two) times daily. 7 day course starting on 04/09/2023 04/09/23 04/16/23 Yes [provider]  escitalopram (LEXAPRO)  20 MG tablet Take 20 mg by mouth daily. 12/29/20  Yes [provider]  guaiFENesin (MUCINEX) 600 MG 12 hr tablet Take 1 tablet (600 mg total) by mouth 2 (two) times daily. 05/20/13  Yes Erick Blinks, MD  ipratropium-albuterol (DUONEB) 0.5-2.5 (3) MG/3ML SOLN Take 3 mLs by nebulization every 6 (six) hours as needed (for shortness of breath/wheezing).   Yes [provider]  omeprazole (PRILOSEC) 20 MG capsule Take 20 mg by mouth 2 (two) times daily. 03/08/21  Yes [provider]  predniSONE (DELTASONE) 20 MG tablet Take 20 mg by mouth in the morning and at bedtime. 5 day course starting on 04/09/2023   Yes [provider]      Allergies    Codeine and Egg-derived products    Review of Systems   Review of Systems  Respiratory:  Positive for cough.   Cardiovascular:  Positive for chest pain.  Gastrointestinal:  Positive for abdominal pain.  All other systems reviewed and are negative.   Physical Exam Updated Vital Signs BP (!) 106/40   Pulse 75   Temp 97.8 F (36.6 C) (Oral)   Resp 13   Ht 5\' 1"  (1.549 m)   Wt 64.5 kg   SpO2 99%   BMI 26.87 kg/m  Physical Exam Vitals and nursing note reviewed.   Gen: Mild conversational dyspnea noted Eyes: PERRL, EOMI HEENT: no oropharyngeal swelling Neck: trachea midline Resp: diffuse wheezes throughout  all lung fields Card: RRR, no murmurs, rubs, or gallops Abd: nontender, nondistended Extremities: no calf tenderness, no edema Vascular: 2+ radial pulses bilaterally, 2+ DP pulses bilaterally Neuro: no focal deficits Skin: no rashes Psyc: acting appropriately   ED Results / Procedures / Treatments   Labs (all labs ordered are listed, but only abnormal results are displayed) Labs Reviewed  CBC WITH DIFFERENTIAL/PLATELET - Abnormal; Notable for the following components:      Result Value   WBC 11.3 (*)    Hemoglobin 8.7 (*)    HCT 29.7 (*)    MCV 68.4 (*)    MCH 20.0 (*)    MCHC 29.3 (*)    RDW  17.4 (*)    All other components within normal limits  COMPREHENSIVE METABOLIC PANEL - Abnormal; Notable for the following components:   Sodium 120 (*)    Chloride 83 (*)    Glucose, Bld 140 (*)    Calcium 8.1 (*)    Total Protein 6.4 (*)    AST 11 (*)    All other components within normal limits  LIPASE, BLOOD - Abnormal; Notable for the following components:   Lipase 71 (*)    All other components within normal limits  MAGNESIUM - Abnormal; Notable for the following components:   Magnesium <0.5 (*)    All other components within normal limits  RESP PANEL BY RT-PCR (RSV, FLU A&B, COVID)  RVPGX2  MRSA NEXT GEN BY PCR, NASAL  BRAIN NATRIURETIC PEPTIDE  CREATININE, URINE, RANDOM  SODIUM, URINE, RANDOM  SODIUM  MAGNESIUM  OSMOLALITY, URINE  FERRITIN  IRON AND TIBC  COMPREHENSIVE METABOLIC PANEL  CBC  MAGNESIUM  PHOSPHORUS  OSMOLALITY    EKG EKG Interpretation Date/Time:  Saturday April 14 2023 16:22:57 EST Ventricular Rate:  74 PR Interval:  186 QRS Duration:  86 QT Interval:  453 QTC Calculation: 503 R Axis:   82  Text Interpretation: Sinus rhythm Consider left atrial enlargement Borderline right axis deviation Prolonged QT interval Confirmed by Beckey Downing [phone]) on 04/14/2023 4:53:20 PM  Radiology CT ABDOMEN PELVIS W CONTRAST Result Date: 04/14/2023 CLINICAL DATA:  Abdominal pain and vomiting. EXAM: CT ABDOMEN AND PELVIS WITH CONTRAST TECHNIQUE: Multidetector CT imaging of the abdomen and pelvis was performed using the standard protocol following bolus administration of intravenous contrast. RADIATION DOSE REDUCTION: This exam was performed according to the departmental dose-optimization program which includes automated exposure control, adjustment of the mA and/or kV according to patient size and/or use of iterative reconstruction technique. CONTRAST:  OMNIPAQUE IOHEXOL 300 MG/ML  SOLN COMPARISON:  04/20/2022 FINDINGS: Lower chest: No basilar airspace disease  or pleural effusion. Mild right lower lobe atelectasis. Hepatobiliary: Motion artifact. Allowing for this, no evidence of focal liver lesion. Gallbladder physiologically distended, no calcified stone. No biliary dilatation. Pancreas: Parenchymal atrophy. Motion artifact limits detailed assessment. There may be mild edema adjacent to the pancreatic tail, series 2, image 22. No ductal dilatation or evidence of pancreatic mass. Spleen: Unremarkable allowing for motion. Adrenals/Urinary Tract: No adrenal nodule. No hydronephrosis or renal calculi. Mild bilateral renal parenchymal atrophy. No suspicious renal lesion. Urinary bladder is normal for degree of distension. Stomach/Bowel: Motion artifact limits assessment. Minimal hiatal hernia. The stomach is decompressed. No small bowel distension or obstruction. The appendix is not seen. Small to moderate colonic stool burden. No evidence of bowel inflammation. Vascular/Lymphatic: Minimal aortic atherosclerosis. No aortic aneurysm. The portal vein is patent. No bulky abdominopelvic adenopathy. Reproductive: Status post hysterectomy. No adnexal masses.  Other: Again seen coarse calcification in the left upper quadrant adjacent to the stomach. No ascites. No free air. Musculoskeletal: Scoliosis and degenerative change in the spine. There are no acute or suspicious osseous abnormalities. IMPRESSION: 1. Motion artifact limits assessment. 2. Possible mild edema adjacent to the pancreatic tail, can be seen with acute pancreatitis. Recommend correlation with pancreatic enzymes. Aortic Atherosclerosis (ICD10-I70.0). Electronically Signed   By: Narda Rutherford M.D.   On: 04/14/2023 18:33   DG Chest 2 View Result Date: 04/14/2023 CLINICAL DATA:  SOB chronic cough EXAM: CHEST - 2 VIEW COMPARISON:  05/19/2013. FINDINGS: Enlarged cardiac silhouette. Lungs are hyperinflated suggesting COPD. There is no focal consolidation. No pneumothorax or pleural effusion. Aortic calcifications.  The visualized skeletal structures are unremarkable. IMPRESSION: Enlarged cardiac silhouette.  COPD. Electronically Signed   By: Layla Maw M.D.   On: 04/14/2023 17:35    Procedures Procedures    Medications Ordered in ED Medications  Chlorhexidine Gluconate Cloth 2 % PADS 6 each (6 each Topical Given 04/14/23 2005)  dextromethorphan-guaiFENesin (MUCINEX DM) 30-600 MG per 12 hr tablet 1 tablet (1 tablet Oral Given 04/14/23 2117)  pantoprazole (PROTONIX) EC tablet 40 mg (40 mg Oral Given 04/14/23 2117)  azithromycin (ZITHROMAX) tablet 500 mg (has no administration in time range)    Followed by  azithromycin (ZITHROMAX) tablet 250 mg (has no administration in time range)  calcium carbonate (OS-CAL - dosed in mg of elemental calcium) tablet 1,250 mg (has no administration in time range)  enoxaparin (LOVENOX) injection 40 mg (40 mg Subcutaneous Given 04/14/23 2123)  acetaminophen (TYLENOL) tablet 650 mg (650 mg Oral Given 04/14/23 2117)    Or  acetaminophen (TYLENOL) suppository 650 mg ( Rectal See Alternative 04/14/23 2117)  prochlorperazine (COMPAZINE) injection 10 mg (10 mg Intravenous Given 04/14/23 2116)  escitalopram (LEXAPRO) tablet 20 mg (20 mg Oral Given 04/14/23 2117)  methylPREDNISolone sodium succinate (SOLU-MEDROL) 40 mg/mL injection 40 mg (has no administration in time range)  levalbuterol (XOPENEX) nebulizer solution 0.63 mg (0.63 mg Nebulization Given 04/14/23 2101)  guaiFENesin-dextromethorphan (ROBITUSSIN DM) 100-10 MG/5ML syrup 5 mL (has no administration in time range)  ipratropium (ATROVENT) nebulizer solution 0.5 mg (has no administration in time range)  ipratropium-albuterol (DUONEB) 0.5-2.5 (3) MG/3ML nebulizer solution 3 mL (3 mLs Nebulization Given 04/14/23 1639)  ipratropium-albuterol (DUONEB) 0.5-2.5 (3) MG/3ML nebulizer solution 3 mL (3 mLs Nebulization Given 04/14/23 1639)  methylPREDNISolone sodium succinate (SOLU-MEDROL) 125 mg/2 mL injection 125 mg (125 mg  Intravenous Given 04/14/23 1640)  azithromycin (ZITHROMAX) 500 mg in sodium chloride 0.9 % 250 mL IVPB (0 mg Intravenous Stopped 04/14/23 1750)  prochlorperazine (COMPAZINE) injection 5 mg (5 mg Intravenous Given 04/14/23 1702)  diphenhydrAMINE (BENADRYL) injection 12.5 mg (12.5 mg Intravenous Given 04/14/23 1702)  iohexol (OMNIPAQUE) 300 MG/ML solution 100 mL (100 mLs Intravenous Contrast Given 04/14/23 1757)  sodium chloride 0.9 % bolus 1,000 mL (1,000 mLs Intravenous New Bag/Given 04/14/23 1843)  magnesium sulfate IVPB 4 g 100 mL (4 g Intravenous New Bag/Given 04/14/23 1843)  levalbuterol (XOPENEX) 0.63 MG/3ML nebulizer solution (  Given 04/14/23 2100)    ED Course/ Medical Decision Making/ A&P                                 Medical Decision Making 73 year old female with his past medical history of asthma and COPD presenting to the emergency department today with cough, posttussive emesis, and shortness of breath.  The patient  does have a lot of wheezing here on exam.  I will further evaluate her here with basic labs as well as a COVID swab and chest x-ray to evaluate for pneumonia or viral etiologies.  I will give the patient Solu-Medrol here as well as DuoNebs.  Will give her azithromycin here as well for likely COPD exacerbation.  I will obtain LFTs and a lipase.  Her abdominal exam is relatively reassuring.  She is mostly tender in the upper abdomen and states that this is worse with cough so I suspect this very well might be due to some muscle strain from coughing.  If her labs are abnormal or she has worsening pain we will consider imaging but this seems more consistent with respiratory illness or possible viral etiology at this time.  The patient sodium did come back at 120.  I spoke with Dr. Chales Abrahams from nephrology.  He recommend starting the patient on 125 mL of normal saline per hour and to recheck sodium every 4 hours.  Recommend admission for further monitoring.  I did add on a urine sodium  and creatinine.  The patient's magnesium level is also critically low.  The patient is given 4 g of magnesium.  He is admitted for further evaluation.  CRITICAL CARE Performed by: Durwin Glaze   Total critical care time: 40 minutes  Critical care time was exclusive of separately billable procedures and treating other patients.  Critical care was necessary to treat or prevent imminent or life-threatening deterioration.  Critical care was time spent personally by me on the following activities: development of treatment plan with patient and/or surrogate as well as nursing, discussions with consultants, evaluation of patient's response to treatment, examination of patient, obtaining history from patient or surrogate, ordering and performing treatments and interventions, ordering and review of laboratory studies, ordering and review of radiographic studies, pulse oximetry and re-evaluation of patient's condition.   Amount and/or Complexity of Data Reviewed Labs: ordered. Radiology: ordered.  Risk Prescription drug management. Decision regarding hospitalization.           Final Clinical Impression(s) / ED Diagnoses Final diagnoses:  COPD exacerbation (HCC)  Hyponatremia  Hypomagnesemia    Rx / DC Orders ED Discharge Orders     None         Durwin Glaze, MD 04/14/23 2319

## 2023-04-14 NOTE — H&P (Signed)
History and Physical    Patient: [name]:[id number] DOB: 11/15/1950 DOA: 04/14/2023 DOS: the patient was seen and examined on 04/14/2023 PCP: Rebekah Chesterfield, NP  Patient coming from: Home  Chief Complaint:  Chief Complaint  Patient presents with   Cough   Abdominal Pain   Chest Pain   Respiratory Distress   HPI: [name] is a 73 y.o. female with medical history significant of COPD, asthma, GERD, hypertension who presents to the emergency department from home via EMS due to cough and shortness of breath.  She complained of symptoms started about 4 days ago and complaining of upper abdominal pain which she believes was from coughing which was minimally productive of sputum.  She endorsed 2 episodes of posttussive emesis today which was NBNB and nonpleuritic.  EMS was activated and on arrival of EMS team, Zofran was given due to the posttussive emesis and neb treatment was provided en - route to the ED.  ED Course:  In the emergency department, BP was 155/72 and other vital signs were within normal range.  Patient was noted to become hypoxic within 5 minutes of breathing treatment with O2 sat dropping to 87%, supplemental oxygen via [place] at 2 LPM was provided with improvement in O2 sats to 94-95%. Workup in the ED, magnesium < 0.5, BNP 41.0, lipase 71.  Influenza A, B, SARS coronavirus 2, RSV was negative. CT abdomen and pelvis with contrast showed Possible mild edema adjacent to the pancreatic tail, can be seen with acute pancreatitis. Breathing treatment was provided, azithromycin was given, magnesium was replenished, Compazine was given due to posttussive emesis. Nephrologist (Dr. Arlean Hopping) was consulted and recommended IV NS at 125 mL/h and to recheck sodium every 4 hours. Hospitalist was asked to admit patient for further evaluation and management.  Review of Systems: Review of systems as noted in the HPI. All other systems reviewed and are negative.   Past  Medical History:  Diagnosis Date   Anxiety    Asthma    COPD (chronic obstructive pulmonary disease) (HCC)    Hypertension    Past Surgical History:  Procedure Laterality Date   ABDOMINAL HYSTERECTOMY     BREAST SURGERY     left lumpectomy   HERNIA REPAIR     TONSILLECTOMY      Social History:  reports that she has been smoking cigarettes. She has never used smokeless tobacco. She reports that she does not drink alcohol and does not use drugs.   Allergies  Allergen Reactions   Codeine Nausea And Vomiting   Egg-Derived Products     History reviewed. No pertinent family history.   Prior to Admission medications   Medication Sig Start Date End Date Taking? Authorizing Provider  albuterol (PROVENTIL HFA;VENTOLIN HFA) 108 (90 BASE) MCG/ACT inhaler Inhale 2 puffs into the lungs every 6 (six) hours as needed for wheezing or shortness of breath.   Yes [provider]  ALPRAZolam Prudy Feeler) 0.5 MG tablet Take 0.5 mg by mouth 2 (two) times daily.   Yes [provider]  BREZTRI AEROSPHERE 160-9-4.8 MCG/ACT AERO Inhale 2 puffs into the lungs 2 (two) times daily. 03/08/21  Yes [provider]  cefdinir (OMNICEF) 300 MG capsule Take 300 mg by mouth 2 (two) times daily. 7 day course starting on 04/09/2023 04/09/23 04/16/23 Yes [provider]  escitalopram (LEXAPRO) 20 MG tablet Take 20 mg by mouth daily. 12/29/20  Yes [provider]  guaiFENesin (MUCINEX) 600 MG 12 hr tablet  Take 1 tablet (600 mg total) by mouth 2 (two) times daily. 05/20/13  Yes Erick Blinks, MD  ipratropium-albuterol (DUONEB) 0.5-2.5 (3) MG/3ML SOLN Take 3 mLs by nebulization every 6 (six) hours as needed (for shortness of breath/wheezing).   Yes [provider]  omeprazole (PRILOSEC) 20 MG capsule Take 20 mg by mouth 2 (two) times daily. 03/08/21  Yes [provider]  predniSONE (DELTASONE) 20 MG tablet Take 20 mg by mouth in the morning and at bedtime. 5 day course  starting on 04/09/2023   Yes [provider]    Physical Exam: BP 132/65   Pulse 88   Temp 98.4 F (36.9 C) (Oral)   Resp 17   Ht 5\' 1"  (1.549 m)   Wt 62.6 kg   SpO2 93%   BMI 26.07 kg/m   General: 73 y.o. year-old female well developed well nourished in no acute distress.  Alert and oriented x3. HEENT: NCAT, EOMI Neck: Supple, trachea medial Cardiovascular: Regular rate and rhythm with no rubs or gallops.  No thyromegaly or JVD noted.  No lower extremity edema. 2/4 pulses in all 4 extremities. Respiratory: Coarse breath sounds and diffuse expiratory wheezes on  auscultation with no rales.  Abdomen: Soft, nontender nondistended with normal bowel sounds x4 quadrants. Muskuloskeletal: No cyanosis, clubbing or edema noted bilaterally Neuro: CN II-XII intact, strength 5/5 x 4, sensation, reflexes intact Skin: No ulcerative lesions noted or rashes Psychiatry: Judgement and insight appear normal. Mood is appropriate for condition and setting          Labs on Admission:  Basic Metabolic Panel: Recent Labs  Lab 04/14/23 1631 04/14/23 1632  NA 120*  --   K 3.7  --   CL 83*  --   CO2 27  --   GLUCOSE 140*  --   BUN 12  --   CREATININE 0.55  --   CALCIUM 8.1*  --   MG  --  <0.5*   Liver Function Tests: Recent Labs  Lab 04/14/23 1631  AST 11*  ALT 13  ALKPHOS 63  BILITOT 0.6  PROT 6.4*  ALBUMIN 3.8   Recent Labs  Lab 04/14/23 1631  LIPASE 71*   No results for input(s): "AMMONIA" in the last 168 hours. CBC: Recent Labs  Lab 04/14/23 1631  WBC 11.3*  NEUTROABS 7.6  HGB 8.7*  HCT 29.7*  MCV 68.4*  PLT 368   Cardiac Enzymes: No results for input(s): "CKTOTAL", "CKMB", "CKMBINDEX", "TROPONINI" in the last 168 hours.  BNP (last 3 results) Recent Labs    04/14/23 1630  BNP 41.0    ProBNP (last 3 results) No results for input(s): "PROBNP" in the last 8760 hours.  CBG: No results for input(s): "GLUCAP" in the last 168 hours.  Radiological  Exams on Admission: CT ABDOMEN PELVIS W CONTRAST Result Date: 04/14/2023 CLINICAL DATA:  Abdominal pain and vomiting. EXAM: CT ABDOMEN AND PELVIS WITH CONTRAST TECHNIQUE: Multidetector CT imaging of the abdomen and pelvis was performed using the standard protocol following bolus administration of intravenous contrast. RADIATION DOSE REDUCTION: This exam was performed according to the departmental dose-optimization program which includes automated exposure control, adjustment of the mA and/or kV according to patient size and/or use of iterative reconstruction technique. CONTRAST:  OMNIPAQUE IOHEXOL 300 MG/ML  SOLN COMPARISON:  04/20/2022 FINDINGS: Lower chest: No basilar airspace disease or pleural effusion. Mild right lower lobe atelectasis. Hepatobiliary: Motion artifact. Allowing for this, no evidence of focal liver lesion. Gallbladder physiologically distended,  no calcified stone. No biliary dilatation. Pancreas: Parenchymal atrophy. Motion artifact limits detailed assessment. There may be mild edema adjacent to the pancreatic tail, series 2, image 22. No ductal dilatation or evidence of pancreatic mass. Spleen: Unremarkable allowing for motion. Adrenals/Urinary Tract: No adrenal nodule. No hydronephrosis or renal calculi. Mild bilateral renal parenchymal atrophy. No suspicious renal lesion. Urinary bladder is normal for degree of distension. Stomach/Bowel: Motion artifact limits assessment. Minimal hiatal hernia. The stomach is decompressed. No small bowel distension or obstruction. The appendix is not seen. Small to moderate colonic stool burden. No evidence of bowel inflammation. Vascular/Lymphatic: Minimal aortic atherosclerosis. No aortic aneurysm. The portal vein is patent. No bulky abdominopelvic adenopathy. Reproductive: Status post hysterectomy. No adnexal masses. Other: Again seen coarse calcification in the left upper quadrant adjacent to the stomach. No ascites. No free air. Musculoskeletal:  Scoliosis and degenerative change in the spine. There are no acute or suspicious osseous abnormalities. IMPRESSION: 1. Motion artifact limits assessment. 2. Possible mild edema adjacent to the pancreatic tail, can be seen with acute pancreatitis. Recommend correlation with pancreatic enzymes. Aortic Atherosclerosis (ICD10-I70.0). Electronically Signed   By: Narda Rutherford M.D.   On: 04/14/2023 18:33   DG Chest 2 View Result Date: 04/14/2023 CLINICAL DATA:  SOB chronic cough EXAM: CHEST - 2 VIEW COMPARISON:  05/19/2013. FINDINGS: Enlarged cardiac silhouette. Lungs are hyperinflated suggesting COPD. There is no focal consolidation. No pneumothorax or pleural effusion. Aortic calcifications. The visualized skeletal structures are unremarkable. IMPRESSION: Enlarged cardiac silhouette.  COPD. Electronically Signed   By: Layla Maw M.D.   On: 04/14/2023 17:35    EKG: I independently viewed the EKG done and my findings are as followed: Normal sinus rhythm at rate of 74 bpm with QTc of 503 ms  Assessment/Plan Present on Admission:  Acute exacerbation of chronic obstructive pulmonary disease (COPD) (HCC)  Acute respiratory failure with hypoxia (HCC)  Principal Problem:   Acute exacerbation of chronic obstructive pulmonary disease (COPD) (HCC) Active Problems:   Acute respiratory failure with hypoxia (HCC)   Hyponatremia   Hypomagnesemia   Hypocalcemia   Microcytic anemia   Prolonged QT interval   GERD (gastroesophageal reflux disease)   Depression  Acute exacerbation of COPD Acute respiratory failure with hypoxia Continue Xopenex, Mucinex, Robitussin, Solu-Medrol, azithromycin. Continue Protonix to prevent steroid-induced ulcer Continue incentive spirometry and flutter valve Continue supplemental oxygen to maintain O2 sat > 92% with plan to wean patient off oxygen as tolerated  Hyponatremia Na 120, urine sodium was 86 Urine osmolality and serum osmolality will be checked Continue  serial BMP management Nephrologist (Dr. Arlean Hopping) was consulted and recommended IV NS at 125 mL/h per EDP  Hypomagnesemia Mg level is 0.5 This was replenished in the ED Please continue to monitor Mg level and correct accordingly  Microcytic anemia MCV 68.4, hemoglobin 8.7 Iron studies will be checked  Hypocalcemia Calcium 8.1, continue Os-Cal  Questionable pancreatitis CT abdomen and pelvis was suggestive of pancreatitis. Patient's abdominal pain was atypical for pulm otitis Lipase was 171 This was possibly due to abdominal soreness from persistent coughing  Prolonged QT interval QTc 503 ms Avoid QT prolonging drugs Magnesium level will be checked Repeat EKG in the morning  GERD Continue Protonix  Anxiety and depression Continue Xanax, Lexapro  DVT prophylaxis: Lovenox  Code Status: Full code  Family Communication: None at bedside  Consults: Nephrology   Severity of Illness: The appropriate patient status for this patient is INPATIENT. Inpatient status is judged to be reasonable and  necessary in order to provide the required intensity of service to ensure the patient's safety. The patient's presenting symptoms, physical exam findings, and initial radiographic and laboratory data in the context of their chronic comorbidities is felt to place them at high risk for further clinical deterioration. Furthermore, it is not anticipated that the patient will be medically stable for discharge from the hospital within 2 midnights of admission.   * I certify that at the point of admission it is my clinical judgment that the patient will require inpatient hospital care spanning beyond 2 midnights from the point of admission due to high intensity of service, high risk for further deterioration and high frequency of surveillance required.*  Author: Frankey Shown, DO 04/14/2023 8:23 PM  For on call review [email or web address].

## 2023-04-15 ENCOUNTER — Inpatient Hospital Stay (HOSPITAL_COMMUNITY): Payer: PPO

## 2023-04-15 DIAGNOSIS — J441 Chronic obstructive pulmonary disease with (acute) exacerbation: Secondary | ICD-10-CM

## 2023-04-15 DIAGNOSIS — K8500 Idiopathic acute pancreatitis without necrosis or infection: Secondary | ICD-10-CM

## 2023-04-15 DIAGNOSIS — E871 Hypo-osmolality and hyponatremia: Secondary | ICD-10-CM | POA: Diagnosis not present

## 2023-04-15 DIAGNOSIS — K859 Acute pancreatitis without necrosis or infection, unspecified: Secondary | ICD-10-CM

## 2023-04-15 DIAGNOSIS — K219 Gastro-esophageal reflux disease without esophagitis: Secondary | ICD-10-CM

## 2023-04-15 DIAGNOSIS — F32A Depression, unspecified: Secondary | ICD-10-CM | POA: Diagnosis not present

## 2023-04-15 DIAGNOSIS — D509 Iron deficiency anemia, unspecified: Secondary | ICD-10-CM

## 2023-04-15 LAB — MAGNESIUM
Magnesium: 2.5 mg/dL — ABNORMAL HIGH (ref 1.7–2.4)
Magnesium: 3 mg/dL — ABNORMAL HIGH (ref 1.7–2.4)

## 2023-04-15 LAB — CBC
HCT: 30.3 % — ABNORMAL LOW (ref 36.0–46.0)
Hemoglobin: 9.1 g/dL — ABNORMAL LOW (ref 12.0–15.0)
MCH: 20.4 pg — ABNORMAL LOW (ref 26.0–34.0)
MCHC: 30 g/dL (ref 30.0–36.0)
MCV: 68.1 fL — ABNORMAL LOW (ref 80.0–100.0)
Platelets: 374 10*3/uL (ref 150–400)
RBC: 4.45 MIL/uL (ref 3.87–5.11)
RDW: 17.1 % — ABNORMAL HIGH (ref 11.5–15.5)
WBC: 7.7 10*3/uL (ref 4.0–10.5)
nRBC: 0 % (ref 0.0–0.2)

## 2023-04-15 LAB — FERRITIN: Ferritin: 6 ng/mL — ABNORMAL LOW (ref 11–307)

## 2023-04-15 LAB — COMPREHENSIVE METABOLIC PANEL
ALT: 14 U/L (ref 0–44)
AST: 12 U/L — ABNORMAL LOW (ref 15–41)
Albumin: 3.7 g/dL (ref 3.5–5.0)
Alkaline Phosphatase: 66 U/L (ref 38–126)
Anion gap: 8 (ref 5–15)
BUN: 12 mg/dL (ref 8–23)
CO2: 27 mmol/L (ref 22–32)
Calcium: 8 mg/dL — ABNORMAL LOW (ref 8.9–10.3)
Chloride: 86 mmol/L — ABNORMAL LOW (ref 98–111)
Creatinine, Ser: 0.45 mg/dL (ref 0.44–1.00)
GFR, Estimated: >60 mL/min (ref >60)
Glucose, Bld: 145 mg/dL — ABNORMAL HIGH (ref 70–99)
Potassium: 4.2 mmol/L (ref 3.5–5.1)
Sodium: 121 mmol/L — ABNORMAL LOW (ref 135–145)
Total Bilirubin: 0.6 mg/dL (ref 0.0–1.2)
Total Protein: 6.3 g/dL — ABNORMAL LOW (ref 6.5–8.1)

## 2023-04-15 LAB — OSMOLALITY: Osmolality: 269 mosm/kg — ABNORMAL LOW (ref 275–295)

## 2023-04-15 LAB — IRON AND TIBC
Iron: 19 ug/dL — ABNORMAL LOW (ref 28–170)
Saturation Ratios: 4 % — ABNORMAL LOW (ref 10.4–31.8)
TIBC: 501 ug/dL — ABNORMAL HIGH (ref 250–450)
UIBC: 482 ug/dL

## 2023-04-15 LAB — PHOSPHORUS: Phosphorus: 3.7 mg/dL (ref 2.5–4.6)

## 2023-04-15 LAB — OSMOLALITY, URINE: Osmolality, Ur: 296 mosm/kg — ABNORMAL LOW (ref 300–900)

## 2023-04-15 LAB — SODIUM: Sodium: 121 mmol/L — ABNORMAL LOW (ref 135–145)

## 2023-04-15 MED ORDER — ALPRAZOLAM 0.5 MG PO TABS: 1.0000 mg | ORAL_TABLET | Freq: Three times a day (TID) | ORAL | Status: DC | PRN

## 2023-04-15 MED ORDER — SODIUM CHLORIDE 0.9 % IV SOLN
300.0000 mg | Freq: Once | INTRAVENOUS | Status: AC
  Administered 2023-04-15: 300 mg via INTRAVENOUS
  Filled 2023-04-15: qty 15

## 2023-04-15 MED ORDER — PROCHLORPERAZINE EDISYLATE 10 MG/2ML IJ SOLN
10.0000 mg | INTRAMUSCULAR | Status: DC | PRN
  Administered 2023-04-15: 10 mg via INTRAVENOUS
  Filled 2023-04-15: qty 2

## 2023-04-15 MED ORDER — MORPHINE SULFATE (PF) 2 MG/ML IV SOLN
1.0000 mg | INTRAVENOUS | Status: DC | PRN
  Administered 2023-04-15: 1 mg via INTRAVENOUS
  Filled 2023-04-15: qty 1

## 2023-04-15 MED ORDER — ALPRAZOLAM 0.5 MG PO TABS
0.5000 mg | ORAL_TABLET | Freq: Three times a day (TID) | ORAL | Status: DC | PRN
  Administered 2023-04-15 – 2023-04-17 (×2): 0.5 mg via ORAL
  Filled 2023-04-15 (×2): qty 1

## 2023-04-15 MED ORDER — BUDESONIDE 0.25 MG/2ML IN SUSP
0.2500 mg | Freq: Two times a day (BID) | RESPIRATORY_TRACT | Status: DC
  Administered 2023-04-15 – 2023-04-17 (×4): 0.25 mg via RESPIRATORY_TRACT
  Filled 2023-04-15 (×4): qty 2

## 2023-04-15 MED ORDER — HYDRALAZINE HCL 20 MG/ML IJ SOLN
5.0000 mg | Freq: Four times a day (QID) | INTRAMUSCULAR | Status: DC | PRN
  Administered 2023-04-15: 5 mg via INTRAVENOUS
  Filled 2023-04-15: qty 1

## 2023-04-15 MED ORDER — IPRATROPIUM-ALBUTEROL 0.5-2.5 (3) MG/3ML IN SOLN
3.0000 mL | Freq: Four times a day (QID) | RESPIRATORY_TRACT | Status: DC
  Administered 2023-04-15 – 2023-04-17 (×7): 3 mL via RESPIRATORY_TRACT
  Filled 2023-04-15 (×9): qty 3

## 2023-04-15 MED ORDER — LEVALBUTEROL HCL 1.25 MG/0.5ML IN NEBU
1.2500 mg | INHALATION_SOLUTION | Freq: Four times a day (QID) | RESPIRATORY_TRACT | Status: DC
  Administered 2023-04-15: 1.25 mg via RESPIRATORY_TRACT
  Filled 2023-04-15: qty 0.5

## 2023-04-15 MED ORDER — IPRATROPIUM-ALBUTEROL 0.5-2.5 (3) MG/3ML IN SOLN
3.0000 mL | RESPIRATORY_TRACT | Status: DC | PRN
  Administered 2023-04-16: 3 mL via RESPIRATORY_TRACT

## 2023-04-15 MED ORDER — OXYCODONE HCL 5 MG PO TABS
5.0000 mg | ORAL_TABLET | Freq: Four times a day (QID) | ORAL | Status: DC | PRN
  Administered 2023-04-15: 5 mg via ORAL
  Filled 2023-04-15: qty 1

## 2023-04-15 MED ORDER — NICOTINE 21 MG/24HR TD PT24
21.0000 mg | MEDICATED_PATCH | Freq: Every day | TRANSDERMAL | Status: DC
  Administered 2023-04-15 – 2023-04-17 (×3): 21 mg via TRANSDERMAL
  Filled 2023-04-15 (×3): qty 1

## 2023-04-15 MED ORDER — ALPRAZOLAM 0.5 MG PO TABS
0.5000 mg | ORAL_TABLET | Freq: Two times a day (BID) | ORAL | Status: DC
  Administered 2023-04-15: 0.5 mg via ORAL
  Filled 2023-04-15: qty 1

## 2023-04-15 NOTE — Hospital Course (Signed)
Mrs. Lutzke was admitted to the hospital with the working diagnosis of COPD exacerbation.   73 yo female with the past medical history of COPD, asthma, hypertension and GERD who presented with dyspnea and cough. Reported 4 days of progressive dyspnea and cough, increased sputum production and upper abdominal pain with coughing. On the day of admission her symptoms were severe in intensity accompanied with chest pain prompting her to call EMS.  On her initial physical examination her blood pressure was 155/72, HR 88, RR 17 and 02 saturation 87% up to 94 to 95% on 2 L/min per nasal cannula.  Lungs with diffuse wheezing bilaterally, with no rales or rhonchi, heart with S1 and S2 present and regular with no gallops, rubs or murmurs, abdomen with no distention and no lower extremity edema.   Na 120, K 3,7 Cl 83, bicarbonate 27, glucose 140, bun 12 cr 0,5  Mg <0.5 and 3,0  Lipase 71  AST 11 and ALT 13  Wbc 11,3 hgb 8,7 plt 368   Sars covid 19 negative Influenza negative  Urinary Na 86.  Chest radiograph with hyperinflation with mild cardiomegaly, no infiltrates and no effusions.   CT abdomen with possible edema adjacent to the pancreatic tail, possible pancreatitis.   EKG 74 bpm, normal axis, qtc 503, sinus rhythm with left atrial enlargement, no significant ST segment or T wave changes.

## 2023-04-15 NOTE — Assessment & Plan Note (Addendum)
Acute hypoxemic respiratory failure.   Patient continue with significant air trapping and dyspnea.   Plan to continue aggressive bronchodilator therapy with duoneb as needed and scheduled.  Systemic and inhaled corticosteroids.  Antibiotic therapy with azithromycin.  Air way clearing techniques with flutter valve and incentive spirometer.  Out of bed to chair tid with meals.   Continue supplemental 02 per [place] to keep 02 saturation 92% or greater.   Smoking cessation counseling.

## 2023-04-15 NOTE — Progress Notes (Signed)
Dr.Arrien notified that pt.still complaining of headache, feeling nervous, hot and nauseous.

## 2023-04-15 NOTE — Progress Notes (Signed)
   04/15/23 1150  TOC Brief Assessment  Insurance and Status Reviewed  Patient has primary care physician Yes  Home environment has been reviewed from home  Prior level of function: independent  Prior/Current Home Services No current home services  Social Drivers of Health Review SDOH reviewed no interventions necessary  Readmission risk has been reviewed Yes  Transition of care needs no transition of care needs at this time    Transition of Care Department [facility]) has reviewed patient and no TOC needs have been identified at this time. We will continue to monitor patient advancement through interdisciplinary progression rounds. If new patient transition needs arise, please place a TOC consult.

## 2023-04-15 NOTE — Assessment & Plan Note (Signed)
Continue with escitalopram.  ?

## 2023-04-15 NOTE — Progress Notes (Addendum)
Dr.Arrien notified of following:   Pt.has headache, nausea, feeling hot.  Also pt.is anxious. Noted that pt.takes xanax twice daily at home.  MD also notified of pt's urine having strong smell.

## 2023-04-15 NOTE — Assessment & Plan Note (Signed)
Hypomagnesemia.   Likely hypovolemic hyponatremia. Mg has corrected at 2,5  Renal function with serum cr at 0,45 with K at 4,2 and Na 121   Will continue supportive medical care, allow regular diet and encourage po intake.  Follow up renal function and electrolytes in am.

## 2023-04-15 NOTE — Assessment & Plan Note (Signed)
Iron 19, TIBC 501, transferrin saturation 4 and ferritin 6   Plan to add IV iron today.  Cell count with hgb at 9,1

## 2023-04-15 NOTE — Progress Notes (Addendum)
Progress Note   Patient: [name] NGE:[id number] DOB: [birth year] DOA: 04/14/2023     1 DOS: the patient was seen and examined on 04/15/2023   Brief hospital course: Mrs. Dicenso was admitted to the hospital with the working diagnosis of COPD exacerbation.   73 yo female with the past medical history of COPD, asthma, hypertension and GERD who presented with dyspnea and cough. Reported 4 days of progressive dyspnea and cough, increased sputum production and upper abdominal pain with coughing. On the day of admission her symptoms were severe in intensity accompanied with chest pain prompting her to call EMS.  On her initial physical examination her blood pressure was 155/72, HR 88, RR 17 and 02 saturation 87% up to 94 to 95% on 2 L/min per nasal cannula.  Lungs with diffuse wheezing bilaterally, with no rales or rhonchi, heart with S1 and S2 present and regular with no gallops, rubs or murmurs, abdomen with no distention and no lower extremity edema.   Na 120, K 3,7 Cl 83, bicarbonate 27, glucose 140, bun 12 cr 0,5  Mg <0.5 and 3,0  Lipase 71  AST 11 and ALT 13  Wbc 11,3 hgb 8,7 plt 368   Sars covid 19 negative Influenza negative  Urinary Na 86.  Chest radiograph with hyperinflation with mild cardiomegaly, no infiltrates and no effusions.   CT abdomen with possible edema adjacent to the pancreatic tail, possible pancreatitis.   EKG 74 bpm, normal axis, qtc 503, sinus rhythm with left atrial enlargement, no significant ST segment or T wave changes.      Assessment and Plan: * Acute exacerbation of chronic obstructive pulmonary disease (COPD) (HCC) Acute hypoxemic respiratory failure.   Patient continue with significant air trapping and dyspnea.   Plan to continue aggressive bronchodilator therapy with duoneb as needed and scheduled.  Systemic and inhaled corticosteroids.  Antibiotic therapy with azithromycin.  Air way clearing techniques with flutter valve and incentive  spirometer.  Out of bed to chair tid with meals.   Continue supplemental 02 per [place] to keep 02 saturation 92% or greater.   Smoking cessation counseling.   Pancreatitis, acute Mild pancreatitis.  Will continue pain control with oral and IV analgesics.  Soft diet for now.  Check right upper quadrant [place]  Add antiacid therapy with pantoprazole.   Hyponatremia Hypomagnesemia.   Likely hypovolemic hyponatremia. Mg has corrected at 2,5  Renal function with serum cr at 0,45 with K at 4,2 and Na 121   Will continue supportive medical care, allow regular diet and encourage po intake.  Follow up renal function and electrolytes in am.   Depression Continue with escitalopram.   GERD (gastroesophageal reflux disease) Continue with pantoprazole.   Iron deficiency anemia Iron 19, TIBC 501, transferrin saturation 4 and ferritin 6   Plan to add IV iron today.  Cell count with hgb at 9,1         Subjective: Patient continue to have significant dyspnea and cough, her abdominal pain has been persistent, no nausea or vomiting.   Physical Exam: Vitals:   04/15/23 0732 04/15/23 0740 04/15/23 0756 04/15/23 0800  BP:   (!) 129/55 (!) 149/45  Pulse:  (!) 102  87  Resp:  20 18 14   Temp: 98.2 F (36.8 C)     TempSrc: Axillary     SpO2:  100%  95%  Weight:      Height:       Neurology awake and alert ENT with mild pallor  Cardiovascular with S1 and S2 present and regular with no gallops, rubs or murmurs No JVD No lower extremity edema  Respiratory with prolonged expiratory phase, with diffuse expiratory wheezing with bilateral rhonchi with no rales.  Abdomen with mild distention, tender to palpation at the epigastrium with no rebound or guarding.  No lower extremity edema   Data Reviewed:    Family Communication: no family at the bedside   Disposition: Status is: Inpatient Remains inpatient appropriate because: respiratory support   Planned Discharge Destination:  Home      Author: Coralie Keens, MD 04/15/2023 9:17 AM  For on call review [email or web address].

## 2023-04-15 NOTE — Assessment & Plan Note (Signed)
Mild pancreatitis.  Will continue pain control with oral and IV analgesics.  Soft diet for now.  Check right upper quadrant [place]  Add antiacid therapy with pantoprazole.

## 2023-04-15 NOTE — Assessment & Plan Note (Signed)
 Continue with pantoprazole/.

## 2023-04-15 NOTE — Progress Notes (Signed)
Dr.Arrien notified of pt.still having headache (has improved some since getting pain medicine) and of pt's BP going up. (See flowsheet for specifics)

## 2023-04-16 DIAGNOSIS — J441 Chronic obstructive pulmonary disease with (acute) exacerbation: Secondary | ICD-10-CM | POA: Diagnosis not present

## 2023-04-16 LAB — BASIC METABOLIC PANEL
Anion gap: 7 (ref 5–15)
Anion gap: 10 (ref 5–15)
BUN: 13 mg/dL (ref 8–23)
BUN: 12 mg/dL (ref 8–23)
CO2: 27 mmol/L (ref 22–32)
CO2: 27 mmol/L (ref 22–32)
Calcium: 8.7 mg/dL — ABNORMAL LOW (ref 8.9–10.3)
Calcium: 8.6 mg/dL — ABNORMAL LOW (ref 8.9–10.3)
Chloride: 89 mmol/L — ABNORMAL LOW (ref 98–111)
Chloride: 89 mmol/L — ABNORMAL LOW (ref 98–111)
Creatinine, Ser: 0.53 mg/dL (ref 0.44–1.00)
Creatinine, Ser: 0.67 mg/dL (ref 0.44–1.00)
GFR, Estimated: >60 mL/min (ref >60)
GFR, Estimated: >60 mL/min (ref >60)
Glucose, Bld: 115 mg/dL — ABNORMAL HIGH (ref 70–99)
Glucose, Bld: 228 mg/dL — ABNORMAL HIGH (ref 70–99)
Potassium: 4.2 mmol/L (ref 3.5–5.1)
Potassium: 4.5 mmol/L (ref 3.5–5.1)
Sodium: 123 mmol/L — ABNORMAL LOW (ref 135–145)
Sodium: 126 mmol/L — ABNORMAL LOW (ref 135–145)

## 2023-04-16 LAB — URINALYSIS, ROUTINE W REFLEX MICROSCOPIC
Bilirubin Urine: NEGATIVE
Glucose, UA: NEGATIVE mg/dL
Ketones, ur: NEGATIVE mg/dL
Nitrite: NEGATIVE
Protein, ur: NEGATIVE mg/dL
Specific Gravity, Urine: 1.004 — ABNORMAL LOW (ref 1.005–1.030)
pH: 7 (ref 5.0–8.0)

## 2023-04-16 LAB — MAGNESIUM: Magnesium: 2.2 mg/dL (ref 1.7–2.4)

## 2023-04-16 LAB — SODIUM, URINE, RANDOM: Sodium, Ur: <10 mmol/L

## 2023-04-16 MED ORDER — SODIUM CHLORIDE 0.9 % IV SOLN
1.0000 g | INTRAVENOUS | Status: DC
  Administered 2023-04-16: 1 g via INTRAVENOUS
  Filled 2023-04-16 (×2): qty 10

## 2023-04-16 MED ORDER — SODIUM CHLORIDE 0.9 % IV SOLN: INTRAVENOUS | Status: AC

## 2023-04-16 MED ORDER — PHENAZOPYRIDINE HCL 100 MG PO TABS
100.0000 mg | ORAL_TABLET | Freq: Three times a day (TID) | ORAL | Status: DC | PRN
  Administered 2023-04-16: 100 mg via ORAL
  Filled 2023-04-16 (×2): qty 1

## 2023-04-16 MED ORDER — KETOROLAC TROMETHAMINE 15 MG/ML IJ SOLN: 15.0000 mg | Freq: Four times a day (QID) | INTRAMUSCULAR | Status: DC | PRN

## 2023-04-16 NOTE — Plan of Care (Signed)
  Problem: Acute Rehab PT Goals(only PT should resolve) Goal: Pt Will Go Supine/Side To Sit Outcome: Progressing Flowsheets (Taken 04/16/2023 1553) Pt will go Supine/Side to Sit: with modified independence Goal: Patient Will Transfer Sit To/From Stand Outcome: Progressing Flowsheets (Taken 04/16/2023 1553) Patient will transfer sit to/from stand: with modified independence Goal: Pt Will Transfer Bed To Chair/Chair To Bed Outcome: Progressing Flowsheets (Taken 04/16/2023 1553) Pt will Transfer Bed to Chair/Chair to Bed: with modified independence Goal: Pt Will Ambulate Outcome: Progressing Flowsheets (Taken 04/16/2023 1553) Pt will Ambulate:  100 feet  with supervision  with least restrictive assistive device

## 2023-04-16 NOTE — Plan of Care (Signed)
  Problem: Clinical Measurements: Goal: Ability to maintain clinical measurements within normal limits will improve Outcome: Progressing Goal: Respiratory complications will improve Outcome: Progressing Goal: Cardiovascular complication will be avoided Outcome: Progressing   Problem: Activity: Goal: Risk for activity intolerance will decrease Outcome: Progressing   Problem: Nutrition: Goal: Adequate nutrition will be maintained Outcome: Progressing   Problem: Coping: Goal: Level of anxiety will decrease Outcome: Progressing   Problem: Pain Managment: Goal: General experience of comfort will improve and/or be controlled Outcome: Progressing   Problem: Safety: Goal: Ability to remain free from injury will improve Outcome: Progressing

## 2023-04-16 NOTE — Progress Notes (Signed)
[facility] PROGRESS NOTE  DEANIA SIGUENZA (DOB: Oct 26, 1950) QIO:[id number] PCP: Rebekah Chesterfield, NP  Brief Narrative: Mrs. Lindor was admitted to the hospital with the working diagnosis of COPD exacerbation.    73 yo female with the past medical history of COPD, asthma, hypertension and GERD who presented with dyspnea and cough. Reported 4 days of progressive dyspnea and cough, increased sputum production and upper abdominal pain with coughing. On the day of admission her symptoms were severe in intensity accompanied with chest pain prompting her to call EMS.  On her initial physical examination her blood pressure was 155/72, HR 88, RR 17 and 02 saturation 87% up to 94 to 95% on 2 L/min per nasal cannula.  Lungs with diffuse wheezing bilaterally, with no rales or rhonchi, heart with S1 and S2 present and regular with no gallops, rubs or murmurs, abdomen with no distention and no lower extremity edema.    Na 120, K 3,7 Cl 83, bicarbonate 27, glucose 140, bun 12 cr 0,5  Mg <0.5 and 3,0  Lipase 71  AST 11 and ALT 13  Wbc 11,3 hgb 8,7 plt 368    Sars covid 19 negative Influenza negative  Urinary Na 86.   Chest radiograph with hyperinflation with mild cardiomegaly, no infiltrates and no effusions.    CT abdomen with possible edema adjacent to the pancreatic tail, possible pancreatitis.    EKG 74 bpm, normal axis, qtc 503, sinus rhythm with left atrial enlargement, no significant ST segment or T wave changes.   Subjective: Breathing much better she states but still winded even just with exertion in bed. Not near her baseline. Did have some burning with urination that is starting to improve. No chest pain or other new complaints at all. Still on 2-3L O2.   Objective: BP (!) 170/57   Pulse 88   Temp 98.1 F (36.7 C) (Axillary)   Resp 16   Ht 5\' 1"  (1.549 m)   Wt 64.5 kg   SpO2 93%   BMI 26.87 kg/m   Gen: No distress Pulm: Tachypneic, mildly elevated WOB with  conversation/bed-level exertion, end-expiratory wheezes noted.  CV: Regular rate and rhythm, not tachycardic at time of exam, no MRG or pitting edema GI: Soft, NT, ND, +BS Neuro: Alert and oriented. No new focal deficits. Ext: Warm, no deformities Skin: No open wounds on visualized skin   Assessment & Plan: Acute hypoxic respiratory failure due to AECOPD: Still with some wheezing, significant air trapping and dyspnea.  - Continue systemic steroids - Continue controller inhaler, scheduled and prn nebs.  - Empiric azithromycin continued.  - IS/FV/OOB encouraged.  - Wean oxygen as tolerated (not on this at home)   Acute mild pancreatitis: Pain has improved significantly. RUQ U/S with suggestion of hepatic steatosis without obstruction. Lipase level is 71, some inflammation without fluid collection on CT. Exam currently quite benign.  - Continue diet as ordered, being tolerated.    Hypovolemic hyponatremia: Urine sodium 86 suggestive additionally of inappropriate ADH presence. .  - Give challenge of isotonic saline, recheck this PM.  - Continue regular diet, institute fluid restriction. - Recheck urine sodium    Depression - Continue with escitalopram.    GERD: - Continue with pantoprazole.    Iron deficiency anemia: Iron 19, TIBC 501, transferrin saturation 4 and ferritin 6  - s/p IV iron 2/16, suggest this be continued after discharge and appropriate screenings pursued.  - Monitor Hgb.   Symptomatic pyuria, bacteriuria:  - Add ceftriaxone  with plan for 3 days Tx.   Tyrone Nine, MD [facility] [email or web address] 04/16/2023, 11:21 AM

## 2023-04-16 NOTE — Evaluation (Signed)
Physical Therapy Evaluation Patient Details Name: [name] MRN: [id number] DOB: March 18, 1950 Today's Date: 04/16/2023  History of Present Illness  [name] is a 73 y.o. female with medical history significant of COPD, asthma, GERD, hypertension who presents to the emergency department from home via EMS due to cough and shortness of breath.  She complained of symptoms started about 4 days ago and complaining of upper abdominal pain which she believes was from coughing which was minimally productive of sputum.  She endorsed 2 episodes of posttussive emesis today which was NBNB and nonpleuritic.  EMS was activated and on arrival of EMS team, Zofran was given due to the posttussive emesis and neb treatment was provided en - route to the ED.    Clinical Impression  On arrival; patient sitting up in a chair assisted there by nursing.  She is on 1 L of O2 on arrival saturating at 96%. She is agreeable to therapist assessment.  PT has patient take off her O2 and she stays 92-96% sitting.  She is able to stand up from the chair using her arms to assist up to standing with Sup/CGA and then walk x 70 ft without 1 hand held assist and occasionally wall walking.  She is mildly wheezing but is able talk throughout her walk.  Her O2 saturation remains at 92% up to 100% during and after walking.  Patient returns to chair and PT left her O2 off since her O2 saturation is 92% to 100% and notified nursing of above.  patient left in chair with call button in reach.       If plan is discharge home, recommend the following: A little help with walking and/or transfers;A little help with bathing/dressing/bathroom;Help with stairs or ramp for entrance   Can travel by private vehicle        Equipment Recommendations None recommended by PT  Recommendations for Other Services       Functional Status Assessment Patient has had a recent decline in their functional status and demonstrates the ability to  make significant improvements in function in a reasonable and predictable amount of time.     Precautions / Restrictions Precautions Precautions: Fall Restrictions Weight Bearing Restrictions Per Provider Order: No      Mobility  Bed Mobility Overal bed mobility: Modified Independent             General bed mobility comments: assisted to chair by nursing Patient Response: Cooperative  Transfers Overall transfer level: Needs assistance Equipment used: 1 person hand held assist Transfers: Sit to/from Stand Sit to Stand: Supervision           General transfer comment: sit to stand from chair using arms to push up to standing    Ambulation/Gait Ambulation/Gait assistance: Contact guard assist, Supervision Gait Distance (Feet): 70 Feet Assistive device: 1 person hand held assist Gait Pattern/deviations: Trunk flexed, Shuffle Gait velocity: decreased     General Gait Details: occassionally wall walks  Stairs            Wheelchair Mobility     Tilt Bed Tilt Bed Patient Response: Cooperative  Modified Rankin (Stroke Patients Only)       Balance Overall balance assessment: Needs assistance Sitting-balance support: No upper extremity supported, Feet supported Sitting balance-Leahy Scale: Good Sitting balance - Comments: good sitting balance   Standing balance support: No upper extremity supported, During functional activity Standing balance-Leahy Scale: Fair Standing balance comment: fair to good standing balance with CGA; occassional wall  walking                             Pertinent Vitals/Pain Pain Assessment Pain Assessment: 0-10 Pain Score: 0-No pain Pain Intervention(s): Monitored during session    Home Living Family/patient expects to be discharged to:: Private residence Living Arrangements: Alone Available Help at Discharge: Family;Available 24 hours/day (daughter in law plans to stay with her for a week once she is  discharged home) Type of Home: House Home Access: Stairs to enter Entrance Stairs-Rails: Can reach both;Left;Right Entrance Stairs-Number of Steps: 3   Home Layout: One level Home Equipment: Agricultural consultant (2 wheels);Cane - single point;Grab bars - toilet;Grab bars - tub/shower      Prior Function Prior Level of Function : Independent/Modified Independent                     Extremity/Trunk Assessment   Upper Extremity Assessment Upper Extremity Assessment: Generalized weakness;Right hand dominant    Lower Extremity Assessment Lower Extremity Assessment: Generalized weakness    Cervical / Trunk Assessment Cervical / Trunk Assessment: Kyphotic  Communication   Communication Communication: No apparent difficulties    Cognition Arousal: Alert Behavior During Therapy: WFL for tasks assessed/performed   PT - Cognitive impairments: No apparent impairments                       PT - Cognition Comments: pleasant and cooperative Following commands: Intact       Cueing       General Comments      Exercises     Assessment/Plan    PT Assessment Patient needs continued PT services  PT Problem List Decreased strength;Decreased activity tolerance;Decreased balance;Decreased mobility;Cardiopulmonary status limiting activity       PT Treatment Interventions Gait training;DME instruction;Functional mobility training;Therapeutic activities;Therapeutic exercise;Balance training    PT Goals (Current goals can be found in the Care Plan section)  Acute Rehab PT Goals Patient Stated Goal: return home PT Goal Formulation: With patient Time For Goal Achievement: 04/30/23 Potential to Achieve Goals: Good    Frequency Min 2X/week     Co-evaluation               AM-PAC PT "6 Clicks" Mobility  Outcome Measure Help needed turning from your back to your side while in a flat bed without using bedrails?: A Little Help needed moving from lying on your  back to sitting on the side of a flat bed without using bedrails?: A Little Help needed moving to and from a bed to a chair (including a wheelchair)?: A Little Help needed standing up from a chair using your arms (e.g., wheelchair or bedside chair)?: A Little Help needed to walk in hospital room?: A Little Help needed climbing 3-5 steps with a railing? : A Lot 6 Click Score: 17    End of Session Equipment Utilized During Treatment: Oxygen (had patient walk without O2) Activity Tolerance: Patient tolerated treatment well Patient left: in chair;with call bell/phone within reach Nurse Communication: Mobility status PT Visit Diagnosis: Other abnormalities of gait and mobility (R26.89);Muscle weakness (generalized) (M62.81);Unsteadiness on feet (R26.81)    Time: 0305-0330 PT Time Calculation (min) (ACUTE ONLY): 25 min   Charges:   PT Evaluation $PT Eval Moderate Complexity: 1 Mod   PT General Charges $$ ACUTE PT VISIT: 1 Visit         3:52 PM, 04/16/23 Elizjah Noblet Small Jontavius Rabalais MPT [facility] physical therapy  [phone]

## 2023-04-17 DIAGNOSIS — J441 Chronic obstructive pulmonary disease with (acute) exacerbation: Secondary | ICD-10-CM | POA: Diagnosis not present

## 2023-04-17 LAB — BASIC METABOLIC PANEL
Anion gap: 11 (ref 5–15)
BUN: 15 mg/dL (ref 8–23)
CO2: 26 mmol/L (ref 22–32)
Calcium: 8.6 mg/dL — ABNORMAL LOW (ref 8.9–10.3)
Chloride: 90 mmol/L — ABNORMAL LOW (ref 98–111)
Creatinine, Ser: 0.49 mg/dL (ref 0.44–1.00)
GFR, Estimated: >60 mL/min (ref >60)
Glucose, Bld: 148 mg/dL — ABNORMAL HIGH (ref 70–99)
Potassium: 3.9 mmol/L (ref 3.5–5.1)
Sodium: 127 mmol/L — ABNORMAL LOW (ref 135–145)

## 2023-04-17 MED ORDER — PREDNISONE 20 MG PO TABS: 40.0000 mg | ORAL_TABLET | Freq: Every day | ORAL | 0 refills | Status: AC

## 2023-04-17 MED ORDER — FOSFOMYCIN TROMETHAMINE 3 G PO PACK
3.0000 g | PACK | Freq: Once | ORAL | Status: AC
  Administered 2023-04-17: 3 g via ORAL
  Filled 2023-04-17: qty 3

## 2023-04-17 MED ORDER — AZITHROMYCIN 250 MG PO TABS: 250.0000 mg | ORAL_TABLET | Freq: Every day | ORAL | 0 refills | Status: AC

## 2023-04-17 MED ORDER — FERROUS SULFATE 325 (65 FE) MG PO TBEC: 325.0000 mg | DELAYED_RELEASE_TABLET | Freq: Two times a day (BID) | ORAL | 0 refills | Status: DC

## 2023-04-17 NOTE — TOC Initial Note (Signed)
Transition of Care [facility]) - Initial/Assessment Note    Patient Details  Name: [name] MRN: [id number] Date of Birth: Feb 21, 1951  [facility] CM/SW Contact:    Karn Cassis, LCSW Phone Number: 04/17/2023, 8:05 AM  Clinical Narrative:  Pt admitted due to COPD exacerbation. Pt reports she lives alone and is independent with ADLs at baseline. PT evaluated pt and recommend HHPT. LCSW discussed with pt. She does not feel this is necessary and declines HHPT at this time. Pt reports her daughter-in-law will be staying with her for awhile at d/c. TOC will follow.                  Expected Discharge Plan: Home/Self Care Barriers to Discharge: Continued Medical Work up   Patient Goals and CMS Choice Patient states their goals for this hospitalization and ongoing recovery are:: return home   Choice offered to / list presented to : Patient  ownership interest in [facility].provided to::  (n/a)    Expected Discharge Plan and Services In-house Referral: Clinical Social Work     Living arrangements for the past 2 months: Single Family Home                           HH Arranged: Patient Refused HH          Prior Living Arrangements/Services Living arrangements for the past 2 months: Single Family Home Lives with:: Self Patient language and need for interpreter reviewed:: Yes Do you feel safe going back to the place where you live?: Yes      Need for Family Participation in Patient Care: No (Comment)   Current home services: DME (cane, walker, nebulizer) Criminal Activity/Legal Involvement Pertinent to Current Situation/Hospitalization: No - Comment as needed  Activities of Daily Living   ADL Screening (condition at time of admission) Independently performs ADLs?: Yes (appropriate for developmental age) Is the patient deaf or have difficulty hearing?: No Does the patient have difficulty seeing, even when wearing  glasses/contacts?: No Does the patient have difficulty concentrating, remembering, or making decisions?: No  Permission Sought/Granted                  Emotional Assessment     Affect (typically observed): Appropriate Orientation: : Oriented to Self, Oriented to Place, Oriented to  Time, Oriented to Situation Alcohol / Substance Use: Not Applicable Psych Involvement: No (comment)  Admission diagnosis:  Hypomagnesemia [E83.42] Hyponatremia [E87.1] Acute exacerbation of chronic obstructive pulmonary disease (COPD) (HCC) [J44.1] COPD exacerbation (HCC) [J44.1] Patient Active Problem List   Diagnosis Date Noted   Pancreatitis, acute 04/15/2023   Acute exacerbation of chronic obstructive pulmonary disease (COPD) (HCC) 04/14/2023   Hyponatremia 04/14/2023   Hypomagnesemia 04/14/2023   Hypocalcemia 04/14/2023   Iron deficiency anemia 04/14/2023   Prolonged QT interval 04/14/2023   GERD (gastroesophageal reflux disease) 04/14/2023   Depression 04/14/2023   Acute respiratory failure with hypoxia (HCC) 05/19/2013   Acute bronchitis 05/19/2013   HTN (hypertension) 05/19/2013   PCP:  Rebekah Chesterfield, NP Pharmacy:   [facility] [address], [place] - [address] [place] [address] Phone: [phone] Fax: [phone]     Social Drivers of Health (SDOH) Social History: SDOH Screenings   Food Insecurity: No Food Insecurity (04/14/2023)  Housing: Low Risk  (04/14/2023)  Transportation Needs: No Transportation Needs (04/14/2023)  Utilities: Not At Risk (04/14/2023)  Financial Resource  Strain: Low Risk  (07/18/2021)   Received from [facility], [facility]  Social Connections: Unknown (04/14/2023)  Stress: No Stress Concern Present (07/18/2021)   Received from [facility], [facility]  Tobacco Use: High Risk (04/14/2023)   SDOH Interventions:     Readmission Risk Interventions     No data to display

## 2023-04-17 NOTE — Care Management Important Message (Signed)
Important Message  Patient Details  Name: AIVAH PUTMAN MRN: [id number] Date of Birth: [birth year]   Important Message Given:  Yes - Medicare IM     Corey Harold 04/17/2023, 10:52 AM

## 2023-04-17 NOTE — Plan of Care (Signed)

## 2023-04-17 NOTE — Progress Notes (Signed)
All discharge paperwork, instructions,medications, refills, appts reviewed. All questions and concerns addressed.  Family here to pick pt.up. All belongings reviewed and returned to be taken home.  Pt. Wants to eat lunch before leaving, instructed to call when she is done so staff can assist her out.

## 2023-04-17 NOTE — Discharge Summary (Addendum)
Physician Discharge Summary   Patient: [name] MRN: [id number] DOB: 1950-12-15  Admit date:     04/14/2023  Discharge date: 04/17/23  Discharge Physician: Tyrone Nine   PCP: Rebekah Chesterfield, NP   Recommendations at discharge:  Follow up with PCP in the next week with repeat BMP to monitor hyponatremia and CBC to monitor iron deficiency anemia.  Recommend continued monitoring of iron stores and consider ongoing iron infusions. Also needs to confirm age-appropriate malignancy screenings. Continue tobacco cessation counseling/assistance. Was given nicotine patch while admitted, declined assistance after discharge.    Discharge Diagnoses: Principal Problem:   Acute exacerbation of chronic obstructive pulmonary disease (COPD) (HCC) Active Problems:   Hyponatremia   Pancreatitis, acute   Depression   GERD (gastroesophageal reflux disease)   Iron deficiency anemia   Hospital Course: [name] was admitted to the hospital with the working diagnosis of COPD exacerbation.    73 yo female with the past medical history of COPD, asthma, hypertension and GERD who presented with dyspnea and cough. Reported 4 days of progressive dyspnea and cough, increased sputum production and upper abdominal pain with coughing. On the day of admission her symptoms were severe in intensity accompanied with chest pain prompting her to call EMS.  On her initial physical examination her blood pressure was 155/72, HR 88, RR 17 and 02 saturation 87% up to 94 to 95% on 2 L/min per nasal cannula.  Lungs with diffuse wheezing bilaterally, with no rales or rhonchi, heart with S1 and S2 present and regular with no gallops, rubs or murmurs, abdomen with no distention and no lower extremity edema.    Na 120, K 3,7 Cl 83, bicarbonate 27, glucose 140, bun 12 cr 0,5  Mg <0.5 and 3,0  Lipase 71  AST 11 and ALT 13  Wbc 11,3 hgb 8,7 plt 368    Sars covid 19 negative Influenza negative  Urinary Na 86.    Chest radiograph with hyperinflation with mild cardiomegaly, no infiltrates and no effusions.    CT abdomen with possible edema adjacent to the pancreatic tail, possible pancreatitis.    EKG 74 bpm, normal axis, qtc 503, sinus rhythm with left atrial enlargement, no significant ST segment or T wave changes.   Please see details below on further course during hospitalization.  Assessment and Plan: Acute hypoxic respiratory failure due to AECOPD: Still with some wheezing, significant air trapping and dyspnea.  - Continue systemic steroids x2 more days - Continue controller inhaler, scheduled and prn nebs (has nebulizer and all needed medications at home) - Empiric azithromycin continued x2 more days.  - Ambulated on day of and day prior to discharge feeling well and did not desaturate.    Acute mild pancreatitis: Pain has resolved, tolerating diet without issue. RUQ U/S with suggestion of hepatic steatosis without obstruction. Lipase level was 71, some inflammation without fluid collection on CT. Exam currently quite benign.  - Continue diet as ordered, being tolerated.     Hypovolemic hyponatremia: Urine sodium 86 suggested inappropriate ADH presence, though urine sodium declined appropriately and sodium is improving at goal rate. No nodule/mass on CXR. She is asymptomatic, Na on day of discharge is 127. Suggestion to remain in hospital for IVF and continued monitoring was made but consistently (though politely) declined by patient. She is tolerating po without issues. Given that this underlying issue is resolved, it is most likely that her sodium level will continue improving with diet not restricted in salt. She will  follow up with PCP very closely and return if any failure to take adequate solute/po occurs.    Depression - Continue with escitalopram.    GERD: - Continue with pantoprazole.    Iron deficiency anemia: Iron 19, TIBC 501, transferrin saturation 4 and ferritin 6  - s/p IV  iron 2/16, suggest this be continued after discharge and appropriate screenings pursued. Will prescribe po iron as well.  - Monitor Hgb at follow up.    Symptomatic pyuria, bacteriuria:  - Complete course with fosfomycin prior to discharge.   Tobacco use: Cessation counseling provided, pt declines medication assistance at this time.   Consultants: None Procedures performed: None  Disposition: Home, declines home health  Diet recommendation:  Cardiac diet DISCHARGE MEDICATION: Allergies as of 04/17/2023       Reactions   Codeine Nausea And Vomiting   Egg-derived Products         Medication List     STOP taking these medications    cefdinir 300 MG capsule Commonly known as: OMNICEF       TAKE these medications    albuterol 108 (90 Base) MCG/ACT inhaler Commonly known as: VENTOLIN HFA Inhale 2 puffs into the lungs every 6 (six) hours as needed for wheezing or shortness of breath.   ALPRAZolam 0.5 MG tablet Commonly known as: XANAX Take 0.5 mg by mouth 2 (two) times daily.   azithromycin 250 MG tablet Commonly known as: ZITHROMAX Take 1 tablet (250 mg total) by mouth daily for 2 days.   Breztri Aerosphere 160-9-4.8 MCG/ACT Aero Generic drug: Budeson-Glycopyrrol-Formoterol Inhale 2 puffs into the lungs 2 (two) times daily.   escitalopram 20 MG tablet Commonly known as: LEXAPRO Take 20 mg by mouth daily.   ferrous sulfate 325 (65 FE) MG EC tablet Take 1 tablet (325 mg total) by mouth 2 (two) times daily.   guaiFENesin 600 MG 12 hr tablet Commonly known as: Mucinex Take 1 tablet (600 mg total) by mouth 2 (two) times daily.   ipratropium-albuterol 0.5-2.5 (3) MG/3ML Soln Commonly known as: DUONEB Take 3 mLs by nebulization every 6 (six) hours as needed (for shortness of breath/wheezing).   omeprazole 20 MG capsule Commonly known as: PRILOSEC Take 20 mg by mouth 2 (two) times daily.   predniSONE 20 MG tablet Commonly known as: DELTASONE Take 2 tablets  (40 mg total) by mouth daily with breakfast for 2 days. What changed:  how much to take when to take this additional instructions        Follow-up Information     Rebekah Chesterfield, NP Follow up in 1 week(s).   Specialty: Internal Medicine Contact information: [address] [place] [address] [phone]                Discharge Exam: Ceasar Mons Weights   04/14/23 1619 04/14/23 2000 04/17/23 0300  Weight: 62.6 kg 64.5 kg 65 kg  BP (!) 159/83   Pulse (!) 103   Temp 98.5 F (36.9 C)   Resp 15   Ht 5\' 1"  (1.549 m)   Wt 65 kg   SpO2 91%   BMI 27.08 kg/m   Well-appearing older female standing in room/ambulating and requesting discharge today. Has firm understanding of return precautions and has assistance at home.  End-expiratory wheezing improved, good air exchange (hasn't had neb yet this morning), nonlabored on room air with SpO2 in mid-90%'s throughout encounter RRR, no MRG or edema  Condition at discharge: stable  The results of  significant diagnostics from this hospitalization (including imaging, microbiology, ancillary and laboratory) are listed below for reference.   Imaging Studies: US Abdomen Limited RUQ (LIVER/GB) Result Date: 04/15/2023 CLINICAL DATA:  [id number] Abdominal pain [id number] EXAM: ULTRASOUND ABDOMEN LIMITED RIGHT UPPER QUADRANT COMPARISON:  April 14, 2023 FINDINGS: Gallbladder: No gallstones or wall thickening visualized. No sonographic Murphy sign noted by sonographer. Gallbladder is patulous in appearance. Common bile duct: Diameter: Visualized portion measures 5 mm, within normal limits. Liver: No focal lesion identified. Heterogeneous and overall slightly increased in parenchymal echogenicity. Portal vein is patent on color Doppler imaging with normal direction of blood flow towards the liver. Other: None. IMPRESSION: 1. No sonographic etiology for abdominal pain is identified. 2. Heterogeneous and overall slightly increased hepatic parenchymal  echogenicity as can be seen in hepatic steatosis. Electronically Signed   By: Meda Klinefelter M.D.   On: 04/15/2023 17:59   CT ABDOMEN PELVIS W CONTRAST Result Date: 04/14/2023 CLINICAL DATA:  Abdominal pain and vomiting. EXAM: CT ABDOMEN AND PELVIS WITH CONTRAST TECHNIQUE: Multidetector CT imaging of the abdomen and pelvis was performed using the standard protocol following bolus administration of intravenous contrast. RADIATION DOSE REDUCTION: This exam was performed according to the departmental dose-optimization program which includes automated exposure control, adjustment of the mA and/or kV according to patient size and/or use of iterative reconstruction technique. CONTRAST:  OMNIPAQUE IOHEXOL 300 MG/ML  SOLN COMPARISON:  04/20/2022 FINDINGS: Lower chest: No basilar airspace disease or pleural effusion. Mild right lower lobe atelectasis. Hepatobiliary: Motion artifact. Allowing for this, no evidence of focal liver lesion. Gallbladder physiologically distended, no calcified stone. No biliary dilatation. Pancreas: Parenchymal atrophy. Motion artifact limits detailed assessment. There may be mild edema adjacent to the pancreatic tail, series 2, image 22. No ductal dilatation or evidence of pancreatic mass. Spleen: Unremarkable allowing for motion. Adrenals/Urinary Tract: No adrenal nodule. No hydronephrosis or renal calculi. Mild bilateral renal parenchymal atrophy. No suspicious renal lesion. Urinary bladder is normal for degree of distension. Stomach/Bowel: Motion artifact limits assessment. Minimal hiatal hernia. The stomach is decompressed. No small bowel distension or obstruction. The appendix is not seen. Small to moderate colonic stool burden. No evidence of bowel inflammation. Vascular/Lymphatic: Minimal aortic atherosclerosis. No aortic aneurysm. The portal vein is patent. No bulky abdominopelvic adenopathy. Reproductive: Status post hysterectomy. No adnexal masses. Other: Again seen coarse  calcification in the left upper quadrant adjacent to the stomach. No ascites. No free air. Musculoskeletal: Scoliosis and degenerative change in the spine. There are no acute or suspicious osseous abnormalities. IMPRESSION: 1. Motion artifact limits assessment. 2. Possible mild edema adjacent to the pancreatic tail, can be seen with acute pancreatitis. Recommend correlation with pancreatic enzymes. Aortic Atherosclerosis (ICD10-I70.0). Electronically Signed   By: Narda Rutherford M.D.   On: 04/14/2023 18:33   DG Chest 2 View Result Date: 04/14/2023 CLINICAL DATA:  SOB chronic cough EXAM: CHEST - 2 VIEW COMPARISON:  05/19/2013. FINDINGS: Enlarged cardiac silhouette. Lungs are hyperinflated suggesting COPD. There is no focal consolidation. No pneumothorax or pleural effusion. Aortic calcifications. The visualized skeletal structures are unremarkable. IMPRESSION: Enlarged cardiac silhouette.  COPD. Electronically Signed   By: Layla Maw M.D.   On: 04/14/2023 17:35    Microbiology: Results for orders placed or performed during the hospital encounter of 04/14/23  Resp panel by RT-PCR (RSV, Flu A&B, Covid) Anterior Nasal Swab     Status: None   Collection Time: 04/14/23  4:45 PM   Specimen: Anterior Nasal Swab  Result Value Ref Range  Status   SARS Coronavirus 2 by RT PCR NEGATIVE NEGATIVE Final    Comment: (NOTE) SARS-CoV-2 target nucleic acids are NOT DETECTED.  The SARS-CoV-2 RNA is generally detectable in upper respiratory specimens during the acute phase of infection. The lowest concentration of SARS-CoV-2 viral copies this assay can detect is 138 copies/mL. A negative result does not preclude SARS-Cov-2 infection and should not be used as the sole basis for treatment or other patient management decisions. A negative result may occur with  improper specimen collection/handling, submission of specimen other than nasopharyngeal swab, presence of viral mutation(s) within the areas targeted  by this assay, and inadequate number of viral copies(<138 copies/mL). A negative result must be combined with clinical observations, patient history, and epidemiological information. The expected result is Negative.  Fact Sheet for Patients:  BloggerCourse.com  Fact Sheet for Healthcare Providers:  SeriousBroker.it  This test is no t yet approved or cleared by the [place] FDA and  has been authorized for detection and/or diagnosis of SARS-CoV-2 by FDA under an Emergency Use Authorization (EUA). This EUA will remain  in effect (meaning this test can be used) for the duration of the COVID-19 declaration under Section 564(b)(1) of the Act, 21 U.S.C.section 360bbb-3(b)(1), unless the authorization is terminated  or revoked sooner.       Influenza A by PCR NEGATIVE NEGATIVE Final   Influenza B by PCR NEGATIVE NEGATIVE Final    Comment: (NOTE) The Xpert Xpress SARS-CoV-2/FLU/RSV plus assay is intended as an aid in the diagnosis of influenza from Nasopharyngeal swab specimens and should not be used as a sole basis for treatment. Nasal washings and aspirates are unacceptable for Xpert Xpress SARS-CoV-2/FLU/RSV testing.  Fact Sheet for Patients: BloggerCourse.com  Fact Sheet for Healthcare Providers: SeriousBroker.it  This test is not yet approved or cleared by the [place] FDA and has been authorized for detection and/or diagnosis of SARS-CoV-2 by FDA under an Emergency Use Authorization (EUA). This EUA will remain in effect (meaning this test can be used) for the duration of the COVID-19 declaration under Section 564(b)(1) of the Act, 21 U.S.C. section 360bbb-3(b)(1), unless the authorization is terminated or revoked.     Resp Syncytial Virus by PCR NEGATIVE NEGATIVE Final    Comment: (NOTE) Fact Sheet for Patients: BloggerCourse.com  Fact  Sheet for Healthcare Providers: SeriousBroker.it  This test is not yet approved or cleared by the [place] FDA and has been authorized for detection and/or diagnosis of SARS-CoV-2 by FDA under an Emergency Use Authorization (EUA). This EUA will remain in effect (meaning this test can be used) for the duration of the COVID-19 declaration under Section 564(b)(1) of the Act, 21 U.S.C. section 360bbb-3(b)(1), unless the authorization is terminated or revoked.  Performed at [facility], [address], [place], [place] [address]   MRSA Next Gen by PCR, Nasal     Status: None   Collection Time: 04/14/23  8:00 PM   Specimen: Nasal Mucosa; Nasal Swab  Result Value Ref Range Status   MRSA by PCR Next Gen NOT DETECTED NOT DETECTED Final    Comment: (NOTE) The GeneXpert MRSA Assay (FDA approved for NASAL specimens only), is one component of a comprehensive MRSA colonization surveillance program. It is not intended to diagnose MRSA infection nor to guide or monitor treatment for MRSA infections. Test performance is not FDA approved in patients less than 4 years old. Performed at [facility], [address] [place], [place] [address]     Labs: CBC:  Recent Labs  Lab 04/14/23 1631 04/15/23 0601  WBC 11.3* 7.7  NEUTROABS 7.6  --   HGB 8.7* 9.1*  HCT 29.7* 30.3*  MCV 68.4* 68.1*  PLT 368 374   Basic Metabolic Panel: Recent Labs  Lab 04/14/23 1631 04/14/23 1632 04/14/23 2344 04/15/23 0601 04/16/23 0520 04/16/23 1726 04/17/23 0319  NA 120*  --  121* 121* 123* 126* 127*  K 3.7  --   --  4.2 4.2 4.5 3.9  CL 83*  --   --  86* 89* 89* 90*  CO2 27  --   --  27 27 27 26   GLUCOSE 140*  --   --  145* 115* 228* 148*  BUN 12  --   --  12 13 12 15   CREATININE 0.55  --   --  0.45 0.53 0.67 0.49  CALCIUM 8.1*  --   --  8.0* 8.7* 8.6* 8.6*  MG  --  <0.5* 3.0* 2.5* 2.2  --   --   PHOS  --   --   --  3.7  --   --   --    Liver Function Tests: Recent Labs   Lab 04/14/23 1631 04/15/23 0601  AST 11* 12*  ALT 13 14  ALKPHOS 63 66  BILITOT 0.6 0.6  PROT 6.4* 6.3*  ALBUMIN 3.8 3.7   CBG: No results for input(s): "GLUCAP" in the last 168 hours.  Discharge time spent: greater than 30 minutes.  Signed: Tyrone Nine, MD [facility] 04/17/2023

## 2023-06-04 NOTE — Progress Notes (Unsigned)
 GI Office Note    Referring Provider: Rebekah Chesterfield, NP Primary Care Physician:  Rebekah Chesterfield, NP  Primary Gastroenterologist:  Chief Complaint   No chief complaint on file.    History of Present Illness   [name] is a 73 y.o. female presenting today at the request of Dr. Milinda Cave for abdominal pain.   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  Umb hernia repair IBS Right sided abd pain Recent admission for uri/uti.   RUQ U/S 03/2023:  IMPRESSION: 1. No sonographic etiology for abdominal pain is identified. 2. Heterogeneous and overall slightly increased hepatic parenchymal echogenicity as can be seen in hepatic steatosis.   CT A/P with contrast 03/2023; IMPRESSION: 1. Motion artifact limits assessment. 2. Possible mild edema adjacent to the pancreatic tail, can be seen with acute pancreatitis. Recommend correlation with pancreatic enzymes.   Medications   Current Outpatient Medications  Medication Sig Dispense Refill   albuterol (PROVENTIL HFA;VENTOLIN HFA) 108 (90 BASE) MCG/ACT inhaler Inhale 2 puffs into the lungs every 6 (six) hours as needed for wheezing or shortness of breath.     ALPRAZolam (XANAX) 0.5 MG tablet Take 0.5 mg by mouth 2 (two) times daily.     BREZTRI AEROSPHERE 160-9-4.8 MCG/ACT AERO Inhale 2 puffs into the lungs 2 (two) times daily.     escitalopram (LEXAPRO) 20 MG tablet Take 20 mg by mouth daily.     ferrous sulfate 325 (65 FE) MG EC tablet Take 1 tablet (325 mg total) by mouth 2 (two) times daily. 60 tablet 0   guaiFENesin (MUCINEX) 600 MG 12 hr tablet Take 1 tablet (600 mg total) by mouth 2 (two) times daily. 14 tablet 0   ipratropium-albuterol (DUONEB) 0.5-2.5 (3) MG/3ML SOLN Take 3 mLs by nebulization every 6 (six) hours as needed (for shortness of breath/wheezing).     omeprazole (PRILOSEC) 20 MG capsule Take 20 mg by mouth 2 (two) times daily.     No current facility-administered medications for  this visit.    Allergies   Allergies as of 06/05/2023 - Review Complete 04/14/2023  Allergen Reaction Noted   Codeine Nausea And Vomiting 05/19/2013   Egg-derived products  05/19/2013    Past Medical History   Past Medical History:  Diagnosis Date   Anxiety    Asthma    COPD (chronic obstructive pulmonary disease) (HCC)    Hypertension     Past Surgical History   Past Surgical History:  Procedure Laterality Date   ABDOMINAL HYSTERECTOMY     BREAST SURGERY     left lumpectomy   HERNIA REPAIR     TONSILLECTOMY      Past Family History   No family history on file.  Past Social History   Social History   Socioeconomic History   Marital status: Divorced    Spouse name: Not on file   Number of children: Not on file   Years of education: Not on file   Highest education level: Not on file  Occupational History   Not on file  Tobacco Use   Smoking status: Every Day    Current packs/day: 1.00    Types: Cigarettes   Smokeless tobacco: Never  Substance and Sexual Activity   Alcohol use: No   Drug use: No   Sexual activity: Not on file  Other Topics Concern   Not on file  Social History Narrative   Not on file   Social Drivers of Health  Financial Resource Strain: Low Risk  (07/18/2021)   Received from [facility], [facility]   Overall Financial Resource Strain (CARDIA)    Difficulty of Paying Living Expenses: Not very hard  Food Insecurity: No Food Insecurity (04/14/2023)   Hunger Vital Sign    Worried About Running Out of Food in the Last Year: Never true    Ran Out of Food in the Last Year: Never true  Transportation Needs: No Transportation Needs (04/14/2023)   PRAPARE - Administrator, Civil Service (Medical): No    Lack of Transportation (Non-Medical): No  Physical Activity: Not on file  Stress: No Stress Concern Present (07/18/2021)   Received from [facility], [facility] -  Occupational Stress Questionnaire    Feeling of Stress : Only a little  Social Connections: Unknown (04/14/2023)   Social Connection and Isolation Panel [NHANES]    Frequency of Communication with Friends and Family: Once a week    Frequency of Social Gatherings with Friends and Family: Once a week    Attends Religious Services: Not on Marketing executive or Organizations: Yes    Attends Banker Meetings: Patient unable to answer    Marital Status: Not on file  Intimate Partner Violence: Not At Risk (04/14/2023)   Humiliation, Afraid, Rape, and Kick questionnaire    Fear of Current or Ex-Partner: No    Emotionally Abused: No    Physically Abused: No    Sexually Abused: No    Review of Systems   General: Negative for anorexia, weight loss, fever, chills, fatigue, weakness. Eyes: Negative for vision changes.  ENT: Negative for hoarseness, difficulty swallowing , nasal congestion. CV: Negative for chest pain, angina, palpitations, dyspnea on exertion, peripheral edema.  Respiratory: Negative for dyspnea at rest, dyspnea on exertion, cough, sputum, wheezing.  GI: See history of present illness. GU:  Negative for dysuria, hematuria, urinary incontinence, urinary frequency, nocturnal urination.  MS: Negative for joint pain, low back pain.  Derm: Negative for rash or itching.  Neuro: Negative for weakness, abnormal sensation, seizure, frequent headaches, memory loss,  confusion.  Psych: Negative for anxiety, depression, suicidal ideation, hallucinations.  Endo: Negative for unusual weight change.  Heme: Negative for bruising or bleeding. Allergy: Negative for rash or hives.  Physical Exam   There were no vitals taken for this visit.   General: Well-nourished, well-developed in no acute distress.  Head: Normocephalic, atraumatic.   Eyes: Conjunctiva pink, no icterus. Mouth: Oropharyngeal mucosa moist and pink , no lesions erythema or exudate. Neck: Supple  without thyromegaly, masses, or lymphadenopathy.  Lungs: Clear to auscultation bilaterally.  Heart: Regular rate and rhythm, no murmurs rubs or gallops.  Abdomen: Bowel sounds are normal, nontender, nondistended, no hepatosplenomegaly or masses,  no abdominal bruits or hernia, no rebound or guarding.   Rectal: *** Extremities: No lower extremity edema. No clubbing or deformities.  Neuro: Alert and oriented x 4 , grossly normal neurologically.  Skin: Warm and dry, no rash or jaundice.   Psych: Alert and cooperative, normal mood and affect.  Labs   Lab Results  Component Value Date   NA 127 (L) 04/17/2023   CL 90 (L) 04/17/2023   K 3.9 04/17/2023   CO2 26 04/17/2023   BUN 15 04/17/2023   CREATININE 0.49 04/17/2023   GFRNONAA >60 04/17/2023   CALCIUM 8.6 (L) 04/17/2023   PHOS 3.7 04/15/2023   ALBUMIN 3.7 04/15/2023  GLUCOSE 148 (H) 04/17/2023   Lab Results  Component Value Date   WBC 7.7 04/15/2023   HGB 9.1 (L) 04/15/2023   HCT 30.3 (L) 04/15/2023   MCV 68.1 (L) 04/15/2023   PLT 374 04/15/2023   Lab Results  Component Value Date   ALT 14 04/15/2023   AST 12 (L) 04/15/2023   ALKPHOS 66 04/15/2023   BILITOT 0.6 04/15/2023   Lab Results  Component Value Date   IRON 19 (L) 04/15/2023   TIBC 501 (H) 04/15/2023   FERRITIN 6 (L) 04/15/2023    Imaging Studies   No results found.  Assessment       PLAN   ***   Leanna Battles. Melvyn Neth, MHS, PA-C [facility]

## 2023-06-04 NOTE — H&P (View-Only) (Signed)
 GI Office Note    Referring Provider: Rebekah Chesterfield, NP Primary Care Physician:  Rebekah Chesterfield, NP  Primary Gastroenterologist:  Chief Complaint   No chief complaint on file.    History of Present Illness   [name] is a 73 y.o. female presenting today at the request of Dr. Milinda Cave for abdominal pain.   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  Umb hernia repair IBS Right sided abd pain Recent admission for uri/uti.   RUQ U/S 03/2023:  IMPRESSION: 1. No sonographic etiology for abdominal pain is identified. 2. Heterogeneous and overall slightly increased hepatic parenchymal echogenicity as can be seen in hepatic steatosis.   CT A/P with contrast 03/2023; IMPRESSION: 1. Motion artifact limits assessment. 2. Possible mild edema adjacent to the pancreatic tail, can be seen with acute pancreatitis. Recommend correlation with pancreatic enzymes.   Medications   Current Outpatient Medications  Medication Sig Dispense Refill   albuterol (PROVENTIL HFA;VENTOLIN HFA) 108 (90 BASE) MCG/ACT inhaler Inhale 2 puffs into the lungs every 6 (six) hours as needed for wheezing or shortness of breath.     ALPRAZolam (XANAX) 0.5 MG tablet Take 0.5 mg by mouth 2 (two) times daily.     BREZTRI AEROSPHERE 160-9-4.8 MCG/ACT AERO Inhale 2 puffs into the lungs 2 (two) times daily.     escitalopram (LEXAPRO) 20 MG tablet Take 20 mg by mouth daily.     ferrous sulfate 325 (65 FE) MG EC tablet Take 1 tablet (325 mg total) by mouth 2 (two) times daily. 60 tablet 0   guaiFENesin (MUCINEX) 600 MG 12 hr tablet Take 1 tablet (600 mg total) by mouth 2 (two) times daily. 14 tablet 0   ipratropium-albuterol (DUONEB) 0.5-2.5 (3) MG/3ML SOLN Take 3 mLs by nebulization every 6 (six) hours as needed (for shortness of breath/wheezing).     omeprazole (PRILOSEC) 20 MG capsule Take 20 mg by mouth 2 (two) times daily.     No current facility-administered medications for  this visit.    Allergies   Allergies as of 06/05/2023 - Review Complete 04/14/2023  Allergen Reaction Noted   Codeine Nausea And Vomiting 05/19/2013   Egg-derived products  05/19/2013    Past Medical History   Past Medical History:  Diagnosis Date   Anxiety    Asthma    COPD (chronic obstructive pulmonary disease) (HCC)    Hypertension     Past Surgical History   Past Surgical History:  Procedure Laterality Date   ABDOMINAL HYSTERECTOMY     BREAST SURGERY     left lumpectomy   HERNIA REPAIR     TONSILLECTOMY      Past Family History   No family history on file.  Past Social History   Social History   Socioeconomic History   Marital status: Divorced    Spouse name: Not on file   Number of children: Not on file   Years of education: Not on file   Highest education level: Not on file  Occupational History   Not on file  Tobacco Use   Smoking status: Every Day    Current packs/day: 1.00    Types: Cigarettes   Smokeless tobacco: Never  Substance and Sexual Activity   Alcohol use: No   Drug use: No   Sexual activity: Not on file  Other Topics Concern   Not on file  Social History Narrative   Not on file   Social Drivers of Health  Financial Resource Strain: Low Risk  (07/18/2021)   Received from [facility], [facility]   Overall Financial Resource Strain (CARDIA)    Difficulty of Paying Living Expenses: Not very hard  Food Insecurity: No Food Insecurity (04/14/2023)   Hunger Vital Sign    Worried About Running Out of Food in the Last Year: Never true    Ran Out of Food in the Last Year: Never true  Transportation Needs: No Transportation Needs (04/14/2023)   PRAPARE - Administrator, Civil Service (Medical): No    Lack of Transportation (Non-Medical): No  Physical Activity: Not on file  Stress: No Stress Concern Present (07/18/2021)   Received from [facility], [facility] -  Occupational Stress Questionnaire    Feeling of Stress : Only a little  Social Connections: Unknown (04/14/2023)   Social Connection and Isolation Panel [NHANES]    Frequency of Communication with Friends and Family: Once a week    Frequency of Social Gatherings with Friends and Family: Once a week    Attends Religious Services: Not on Marketing executive or Organizations: Yes    Attends Banker Meetings: Patient unable to answer    Marital Status: Not on file  Intimate Partner Violence: Not At Risk (04/14/2023)   Humiliation, Afraid, Rape, and Kick questionnaire    Fear of Current or Ex-Partner: No    Emotionally Abused: No    Physically Abused: No    Sexually Abused: No    Review of Systems   General: Negative for anorexia, weight loss, fever, chills, fatigue, weakness. Eyes: Negative for vision changes.  ENT: Negative for hoarseness, difficulty swallowing , nasal congestion. CV: Negative for chest pain, angina, palpitations, dyspnea on exertion, peripheral edema.  Respiratory: Negative for dyspnea at rest, dyspnea on exertion, cough, sputum, wheezing.  GI: See history of present illness. GU:  Negative for dysuria, hematuria, urinary incontinence, urinary frequency, nocturnal urination.  MS: Negative for joint pain, low back pain.  Derm: Negative for rash or itching.  Neuro: Negative for weakness, abnormal sensation, seizure, frequent headaches, memory loss,  confusion.  Psych: Negative for anxiety, depression, suicidal ideation, hallucinations.  Endo: Negative for unusual weight change.  Heme: Negative for bruising or bleeding. Allergy: Negative for rash or hives.  Physical Exam   There were no vitals taken for this visit.   General: Well-nourished, well-developed in no acute distress.  Head: Normocephalic, atraumatic.   Eyes: Conjunctiva pink, no icterus. Mouth: Oropharyngeal mucosa moist and pink , no lesions erythema or exudate. Neck: Supple  without thyromegaly, masses, or lymphadenopathy.  Lungs: Clear to auscultation bilaterally.  Heart: Regular rate and rhythm, no murmurs rubs or gallops.  Abdomen: Bowel sounds are normal, nontender, nondistended, no hepatosplenomegaly or masses,  no abdominal bruits or hernia, no rebound or guarding.   Rectal: *** Extremities: No lower extremity edema. No clubbing or deformities.  Neuro: Alert and oriented x 4 , grossly normal neurologically.  Skin: Warm and dry, no rash or jaundice.   Psych: Alert and cooperative, normal mood and affect.  Labs   Lab Results  Component Value Date   NA 127 (L) 04/17/2023   CL 90 (L) 04/17/2023   K 3.9 04/17/2023   CO2 26 04/17/2023   BUN 15 04/17/2023   CREATININE 0.49 04/17/2023   GFRNONAA >60 04/17/2023   CALCIUM 8.6 (L) 04/17/2023   PHOS 3.7 04/15/2023   ALBUMIN 3.7 04/15/2023  GLUCOSE 148 (H) 04/17/2023   Lab Results  Component Value Date   WBC 7.7 04/15/2023   HGB 9.1 (L) 04/15/2023   HCT 30.3 (L) 04/15/2023   MCV 68.1 (L) 04/15/2023   PLT 374 04/15/2023   Lab Results  Component Value Date   ALT 14 04/15/2023   AST 12 (L) 04/15/2023   ALKPHOS 66 04/15/2023   BILITOT 0.6 04/15/2023   Lab Results  Component Value Date   IRON 19 (L) 04/15/2023   TIBC 501 (H) 04/15/2023   FERRITIN 6 (L) 04/15/2023    Imaging Studies   No results found.  Assessment       PLAN   ***   Leanna Battles. Melvyn Neth, MHS, PA-C [facility]

## 2023-06-05 ENCOUNTER — Encounter: Payer: Self-pay | Admitting: *Deleted

## 2023-06-05 ENCOUNTER — Ambulatory Visit: Admitting: Gastroenterology

## 2023-06-05 ENCOUNTER — Other Ambulatory Visit: Payer: Self-pay | Admitting: *Deleted

## 2023-06-05 ENCOUNTER — Encounter: Payer: Self-pay | Admitting: Gastroenterology

## 2023-06-05 VITALS — BP 155/69 | HR 64 | Temp 98.0°F | Ht 60.0 in | Wt 138.6 lb

## 2023-06-05 DIAGNOSIS — D509 Iron deficiency anemia, unspecified: Secondary | ICD-10-CM

## 2023-06-05 DIAGNOSIS — R109 Unspecified abdominal pain: Secondary | ICD-10-CM | POA: Diagnosis not present

## 2023-06-05 DIAGNOSIS — N390 Urinary tract infection, site not specified: Secondary | ICD-10-CM

## 2023-06-05 DIAGNOSIS — R1310 Dysphagia, unspecified: Secondary | ICD-10-CM | POA: Diagnosis not present

## 2023-06-05 DIAGNOSIS — K921 Melena: Secondary | ICD-10-CM | POA: Insufficient documentation

## 2023-06-05 DIAGNOSIS — K5900 Constipation, unspecified: Secondary | ICD-10-CM

## 2023-06-05 DIAGNOSIS — R1319 Other dysphagia: Secondary | ICD-10-CM | POA: Insufficient documentation

## 2023-06-05 DIAGNOSIS — R748 Abnormal levels of other serum enzymes: Secondary | ICD-10-CM

## 2023-06-05 DIAGNOSIS — E871 Hypo-osmolality and hyponatremia: Secondary | ICD-10-CM

## 2023-06-05 DIAGNOSIS — R9389 Abnormal findings on diagnostic imaging of other specified body structures: Secondary | ICD-10-CM | POA: Insufficient documentation

## 2023-06-05 NOTE — Patient Instructions (Signed)
 Please go to [facility] today for labs, located at [facility]  Continue omeprazole 20mg  twice daily before a meal.  Try to cut back on aspirin, until your endoscopy you need to cut back to no more than 81mg  daily.  Referral to urology for recurrent urinary tract infections.   For constipation, please add miralax one capful twice daily until you have a soft stool, then continue once daily to maintain regular stools.  You can stop colace after starting miralax.

## 2023-06-06 LAB — CBC WITH DIFFERENTIAL/PLATELET
Basophils Absolute: 0 10*3/uL (ref 0.0–0.2)
Basos: 1 %
EOS (ABSOLUTE): 0.1 10*3/uL (ref 0.0–0.4)
Eos: 3 %
Hematocrit: 37.7 % (ref 34.0–46.6)
Hemoglobin: 11.8 g/dL (ref 11.1–15.9)
Immature Grans (Abs): 0 10*3/uL (ref 0.0–0.1)
Immature Granulocytes: 0 %
Lymphocytes Absolute: 1.5 10*3/uL (ref 0.7–3.1)
Lymphs: 30 %
MCH: 25.7 pg — ABNORMAL LOW (ref 26.6–33.0)
MCHC: 31.3 g/dL — ABNORMAL LOW (ref 31.5–35.7)
MCV: 82 fL (ref 79–97)
Monocytes Absolute: 0.3 10*3/uL (ref 0.1–0.9)
Monocytes: 6 %
Neutrophils Absolute: 3 10*3/uL (ref 1.4–7.0)
Neutrophils: 60 %
Platelets: 180 10*3/uL (ref 150–450)
RBC: 4.59 x10E6/uL (ref 3.77–5.28)
RDW: 20.6 % — ABNORMAL HIGH (ref 11.7–15.4)
WBC: 5 10*3/uL (ref 3.4–10.8)

## 2023-06-06 LAB — COMPREHENSIVE METABOLIC PANEL WITH GFR
ALT: 8 IU/L (ref 0–32)
AST: 8 IU/L (ref 0–40)
Albumin: 4.3 g/dL (ref 3.8–4.8)
Alkaline Phosphatase: 68 IU/L (ref 44–121)
BUN/Creatinine Ratio: 10 — ABNORMAL LOW (ref 12–28)
BUN: 7 mg/dL — ABNORMAL LOW (ref 8–27)
Bilirubin Total: 0.2 mg/dL (ref 0.0–1.2)
CO2: 25 mmol/L (ref 20–29)
Calcium: 8.7 mg/dL (ref 8.7–10.3)
Chloride: 99 mmol/L (ref 96–106)
Creatinine, Ser: 0.67 mg/dL (ref 0.57–1.00)
Globulin, Total: 1.9 g/dL (ref 1.5–4.5)
Glucose: 105 mg/dL — ABNORMAL HIGH (ref 70–99)
Potassium: 4 mmol/L (ref 3.5–5.2)
Sodium: 140 mmol/L (ref 134–144)
Total Protein: 6.2 g/dL (ref 6.0–8.5)
eGFR: 92 mL/min/{1.73_m2} (ref >59)

## 2023-06-06 LAB — IRON,TIBC AND FERRITIN PANEL
Ferritin: 18 ng/mL (ref 15–150)
Iron Saturation: 6 % — CL (ref 15–55)
Iron: 22 ug/dL — ABNORMAL LOW (ref 27–139)
Total Iron Binding Capacity: 368 ug/dL (ref 250–450)
UIBC: 346 ug/dL (ref 118–369)

## 2023-06-06 LAB — LIPASE: Lipase: 61 U/L (ref 14–85)

## 2023-06-12 ENCOUNTER — Other Ambulatory Visit: Payer: Self-pay

## 2023-06-12 ENCOUNTER — Encounter (HOSPITAL_COMMUNITY): Payer: Self-pay | Admitting: Internal Medicine

## 2023-06-12 ENCOUNTER — Ambulatory Visit (HOSPITAL_COMMUNITY): Admitting: Certified Registered"

## 2023-06-12 ENCOUNTER — Encounter (HOSPITAL_COMMUNITY)
Admission: RE | Disposition: A | Discharge status: Home / Self Care | Payer: Self-pay | Source: Home / Self Care | Attending: Internal Medicine

## 2023-06-12 ENCOUNTER — Ambulatory Visit (HOSPITAL_COMMUNITY)
Admission: RE | Admit: 2023-06-12 | Discharge: 2023-06-12 | Disposition: A | Discharge status: Home / Self Care | Attending: Internal Medicine | Admitting: Internal Medicine

## 2023-06-12 DIAGNOSIS — Z79899 Other long term (current) drug therapy: Secondary | ICD-10-CM | POA: Diagnosis not present

## 2023-06-12 DIAGNOSIS — F419 Anxiety disorder, unspecified: Secondary | ICD-10-CM | POA: Diagnosis not present

## 2023-06-12 DIAGNOSIS — Z7982 Long term (current) use of aspirin: Secondary | ICD-10-CM | POA: Diagnosis not present

## 2023-06-12 DIAGNOSIS — K648 Other hemorrhoids: Secondary | ICD-10-CM | POA: Insufficient documentation

## 2023-06-12 DIAGNOSIS — F32A Depression, unspecified: Secondary | ICD-10-CM | POA: Diagnosis not present

## 2023-06-12 DIAGNOSIS — K921 Melena: Secondary | ICD-10-CM | POA: Diagnosis not present

## 2023-06-12 DIAGNOSIS — D509 Iron deficiency anemia, unspecified: Secondary | ICD-10-CM | POA: Diagnosis present

## 2023-06-12 DIAGNOSIS — K5900 Constipation, unspecified: Secondary | ICD-10-CM | POA: Insufficient documentation

## 2023-06-12 DIAGNOSIS — Z87891 Personal history of nicotine dependence: Secondary | ICD-10-CM | POA: Insufficient documentation

## 2023-06-12 DIAGNOSIS — I10 Essential (primary) hypertension: Secondary | ICD-10-CM | POA: Diagnosis not present

## 2023-06-12 DIAGNOSIS — D649 Anemia, unspecified: Secondary | ICD-10-CM | POA: Diagnosis not present

## 2023-06-12 DIAGNOSIS — K222 Esophageal obstruction: Secondary | ICD-10-CM

## 2023-06-12 DIAGNOSIS — Z87440 Personal history of urinary (tract) infections: Secondary | ICD-10-CM | POA: Insufficient documentation

## 2023-06-12 DIAGNOSIS — R1310 Dysphagia, unspecified: Secondary | ICD-10-CM | POA: Insufficient documentation

## 2023-06-12 DIAGNOSIS — K31819 Angiodysplasia of stomach and duodenum without bleeding: Secondary | ICD-10-CM | POA: Diagnosis not present

## 2023-06-12 DIAGNOSIS — K219 Gastro-esophageal reflux disease without esophagitis: Secondary | ICD-10-CM | POA: Insufficient documentation

## 2023-06-12 DIAGNOSIS — J449 Chronic obstructive pulmonary disease, unspecified: Secondary | ICD-10-CM | POA: Insufficient documentation

## 2023-06-12 DIAGNOSIS — K2970 Gastritis, unspecified, without bleeding: Secondary | ICD-10-CM | POA: Insufficient documentation

## 2023-06-12 DIAGNOSIS — K449 Diaphragmatic hernia without obstruction or gangrene: Secondary | ICD-10-CM | POA: Diagnosis not present

## 2023-06-12 DIAGNOSIS — K31811 Angiodysplasia of stomach and duodenum with bleeding: Secondary | ICD-10-CM | POA: Diagnosis not present

## 2023-06-12 HISTORY — PX: ESOPHAGOGASTRODUODENOSCOPY: SHX5428

## 2023-06-12 HISTORY — PX: ESOPHAGEAL DILATION: SHX303

## 2023-06-12 SURGERY — EGD (ESOPHAGOGASTRODUODENOSCOPY)
Anesthesia: Monitor Anesthesia Care

## 2023-06-12 MED ORDER — IPRATROPIUM-ALBUTEROL 0.5-2.5 (3) MG/3ML IN SOLN
RESPIRATORY_TRACT | Status: AC
Start: 2023-06-12 — End: 2023-06-12
  Administered 2023-06-12: 3 mL via RESPIRATORY_TRACT
  Filled 2023-06-12: qty 3

## 2023-06-12 MED ORDER — GLYCOPYRROLATE PF 0.2 MG/ML IJ SOSY
PREFILLED_SYRINGE | INTRAMUSCULAR | Status: DC | PRN
  Administered 2023-06-12: .1 mg via INTRAVENOUS

## 2023-06-12 MED ORDER — LIDOCAINE HCL (PF) 2 % IJ SOLN
INTRAMUSCULAR | Status: DC | PRN
  Administered 2023-06-12: 60 mg via INTRADERMAL

## 2023-06-12 MED ORDER — LIDOCAINE HCL (PF) 2 % IJ SOLN
INTRAMUSCULAR | Status: AC
  Filled 2023-06-12: qty 5

## 2023-06-12 MED ORDER — PROPOFOL 10 MG/ML IV BOLUS
INTRAVENOUS | Status: DC | PRN
Start: 2023-06-12 — End: 2023-06-12
  Administered 2023-06-12: 30 mg via INTRAVENOUS
  Administered 2023-06-12: 70 mg via INTRAVENOUS
  Administered 2023-06-12: 30 mg via INTRAVENOUS

## 2023-06-12 MED ORDER — PROPOFOL 1000 MG/100ML IV EMUL
INTRAVENOUS | Status: AC
  Filled 2023-06-12: qty 100

## 2023-06-12 MED ORDER — PROPOFOL 500 MG/50ML IV EMUL
INTRAVENOUS | Status: DC | PRN
  Administered 2023-06-12: 150 ug/kg/min via INTRAVENOUS

## 2023-06-12 MED ORDER — LACTATED RINGERS IV SOLN: INTRAVENOUS | Status: DC | PRN

## 2023-06-12 MED ORDER — IPRATROPIUM-ALBUTEROL 0.5-2.5 (3) MG/3ML IN SOLN: 3.0000 mL | Freq: Once | RESPIRATORY_TRACT | Status: AC

## 2023-06-12 MED ORDER — GLYCOPYRROLATE PF 0.2 MG/ML IJ SOSY
PREFILLED_SYRINGE | INTRAMUSCULAR | Status: AC
  Filled 2023-06-12: qty 1

## 2023-06-12 NOTE — Interval H&P Note (Signed)
 History and Physical Interval Note:  06/12/2023 8:16 AM  [name]  has presented today for surgery, with the diagnosis of DYSPHAGIA, MELENA.  The various methods of treatment have been discussed with the patient and family. After consideration of risks, benefits and other options for treatment, the patient has consented to  Procedure(s) with comments: EGD (ESOPHAGOGASTRODUODENOSCOPY) (N/A) - 800am, asa 2 DILATION, ESOPHAGUS (N/A) as a surgical intervention.  The patient's history has been reviewed, patient examined, no change in status, stable for surgery.  I have reviewed the patient's chart and labs.  Questions were answered to the patient's satisfaction.     [name]

## 2023-06-12 NOTE — Anesthesia Postprocedure Evaluation (Signed)
 Anesthesia Post Note  Patient: [name]  Procedure(s) Performed: EGD (ESOPHAGOGASTRODUODENOSCOPY) DILATION, ESOPHAGUS  Patient location during evaluation: PACU Anesthesia Type: MAC Level of consciousness: awake and alert Pain management: pain level controlled Vital Signs Assessment: post-procedure vital signs reviewed and stable Respiratory status: spontaneous breathing, nonlabored ventilation, respiratory function stable and patient connected to nasal cannula oxygen Cardiovascular status: stable and blood pressure returned to baseline Postop Assessment: no apparent nausea or vomiting Anesthetic complications: no   No notable events documented.   Last Vitals:  Vitals:   06/12/23 0713 06/12/23 0844  BP: (!) 135/56 (!) 124/54  Pulse: 71 92  Resp: 14 16  Temp: 36.8 C 36.4 C  SpO2: 95% 100%    Last Pain:  Vitals:   06/12/23 0844  TempSrc: Oral  PainSc: 0-No pain                 Beacher Limerick

## 2023-06-12 NOTE — Anesthesia Preprocedure Evaluation (Signed)
 Anesthesia Evaluation  Patient identified by MRN, date of birth, ID band Patient awake    Airway Mallampati: II  TM Distance: >3 FB     Dental no notable dental hx.    Pulmonary asthma , COPD, former smoker   breath sounds clear to auscultation       Cardiovascular hypertension, Normal cardiovascular exam Rhythm:Regular Rate:Normal     Neuro/Psych   Anxiety Depression       GI/Hepatic Neg liver ROS,GERD  ,,  Endo/Other  negative endocrine ROS    Renal/GU negative Renal ROS  negative genitourinary   Musculoskeletal   Abdominal Normal abdominal exam  (+)   Peds  Hematology  (+) Blood dyscrasia, anemia   Anesthesia Other Findings   Reproductive/Obstetrics                             Anesthesia Physical Anesthesia Plan  ASA: 3  Anesthesia Plan: MAC   Post-op Pain Management:    Induction: Intravenous  PONV Risk Score and Plan: 1  Airway Management Planned: Nasal Cannula  Additional Equipment:   Intra-op Plan:   Post-operative Plan:   Informed Consent: I have reviewed the patients History and Physical, chart, labs and discussed the procedure including the risks, benefits and alternatives for the proposed anesthesia with the patient or authorized representative who has indicated his/her understanding and acceptance.     Dental advisory given  Plan Discussed with: CRNA and Surgeon  Anesthesia Plan Comments:        Anesthesia Quick Evaluation

## 2023-06-12 NOTE — Discharge Instructions (Addendum)
 EGD Discharge instructions Please read the instructions outlined below and refer to this sheet in the next few weeks. These discharge instructions provide you with general information on caring for yourself after you leave the hospital. Your doctor may also give you specific instructions. While your treatment has been planned according to the most current medical practices available, unavoidable complications occasionally occur. If you have any problems or questions after discharge, please call your doctor. ACTIVITY You may resume your regular activity but move at a slower pace for the next 24 hours.  Take frequent rest periods for the next 24 hours.  Walking will help expel (get rid of) the air and reduce the bloated feeling in your abdomen.  No driving for 24 hours (because of the anesthesia (medicine) used during the test).  You may shower.  Do not sign any important legal documents or operate any machinery for 24 hours (because of the anesthesia used during the test).  NUTRITION Drink plenty of fluids.  You may resume your normal diet.  Begin with a light meal and progress to your normal diet.  Avoid alcoholic beverages for 24 hours or as instructed by your caregiver.  MEDICATIONS You may resume your normal medications unless your caregiver tells you otherwise.  WHAT YOU CAN EXPECT TODAY You may experience abdominal discomfort such as a feeling of fullness or "gas" pains.  FOLLOW-UP Your doctor will discuss the results of your test with you.  SEEK IMMEDIATE MEDICAL ATTENTION IF ANY OF THE FOLLOWING OCCUR: Excessive nausea (feeling sick to your stomach) and/or vomiting.  Severe abdominal pain and distention (swelling).  Trouble swallowing.  Temperature over 101 F (37.8 C).  Rectal bleeding or vomiting of blood.    Your EGD revealed mild amount inflammation in your stomach.  I took biopsies of this to rule out infection with a bacteria called H. pylori.  Await pathology results, my  office will contact you.  You also had a small hiatal hernia as well as a tightening of your esophagus called a Schatzki's ring.  I stretched this out today.  Hopefully this helps with feeling of food getting stuck.  In your small bowel, you have multiple angioectasias which are small protruding blood vessels which may have led to your iron deficiency anemia.  I treated all of these with coagulation today.  I want you to pick up ferrous sulfate 325 mg tablets from your pharmacy.  These are over-the-counter.  Would recommend taking 1 pill every other day.  Will recheck your iron levels in a few months, and if still low, we may need to consider iron infusions.  Follow-up in GI office in 6 to 8 weeks.  I hope you have a great rest of your week!  [name]. [name], D.O. Gastroenterology and Hepatology [facility]

## 2023-06-12 NOTE — Transfer of Care (Signed)
 Immediate Anesthesia Transfer of Care Note  Patient: [name]  Procedure(s) Performed: EGD (ESOPHAGOGASTRODUODENOSCOPY) DILATION, ESOPHAGUS  Patient Location: Endoscopy Unit  Anesthesia Type:General  Level of Consciousness: awake and patient cooperative  Airway & Oxygen Therapy: Patient Spontanous Breathing  Post-op Assessment: Report given to RN and Post -op Vital signs reviewed and stable  Post vital signs: Reviewed and stable  Last Vitals:  Vitals Value Taken Time  BP 124/54 06/12/23 0844  Temp 36.4 C 06/12/23 0844  Pulse 92 06/12/23 0844  Resp 16 06/12/23 0844  SpO2 100 % 06/12/23 0844    Last Pain:  Vitals:   06/12/23 0844  TempSrc: Oral  PainSc: 0-No pain      Patients Stated Pain Goal: 5 (06/12/23 0713)  Complications: No notable events documented.

## 2023-06-12 NOTE — Op Note (Signed)
 [facility] Patient Name: [name] Procedure Date: 06/12/2023 7:57 AM MRN: [id number] Date of Birth: [birth year] Attending MD: Rolando Cliche. Roel Clarity, [id number] CSN: [id number] Age: 73 Admit Type: Outpatient Procedure:                Upper GI endoscopy Indications:              Iron deficiency anemia, Dysphagia, Melena Providers:                Rolando Cliche. Mordechai April, DO, Vonna Guardian, [place] Wilson,                            Technician, Theola Fitch Referring MD:              Medicines:                See the Anesthesia note for documentation of the                            administered medications Complications:            No immediate complications. Estimated Blood Loss:     Estimated blood loss was minimal. Procedure:                Pre-Anesthesia Assessment:                           - The anesthesia plan was to use monitored                            anesthesia care (MAC).                           After obtaining informed consent, the endoscope was                            passed under direct vision. Throughout the                            procedure, the patient's blood pressure, pulse, and                            oxygen saturations were monitored continuously. The                            GIF-H190 ([id number]) scope was introduced through the                            mouth, and advanced to the second part of duodenum.                            The upper GI endoscopy was accomplished without                            difficulty. The patient tolerated the procedure                            well.  Scope In: 8:25:17 AM Scope Out: 8:39:32 AM Total Procedure Duration: 0 hours 14 minutes 15 seconds  Findings:      A small hiatal hernia was present.      A mild Schatzki ring was found in the distal esophagus. A TTS dilator       was passed through the scope. Dilation with an 18-19-20 mm balloon       dilator was performed to 20 mm. The dilation site was examined  and       showed moderate improvement in luminal narrowing.      Patchy mild inflammation characterized by erythema was found in the       gastric body and in the gastric antrum. Biopsies were taken with a cold       forceps for Helicobacter pylori testing.      9 small angioectasias without bleeding were found in the second portion       of the duodenum. Coagulation for bleeding prevention using argon plasma       was successful. Impression:               - Small hiatal hernia.                           - Mild Schatzki ring. Dilated.                           - Gastritis. Biopsied.                           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                            Treated with argon plasma coagulation (APC). Moderate Sedation:      Per Anesthesia Care Recommendation:           - Patient has a contact number available for                            emergencies. The signs and symptoms of potential                            delayed complications were discussed with the                            patient. Return to normal activities tomorrow.                            Written discharge instructions were provided to the                            patient.                           - Resume previous diet.                           - Continue present medications.                           -  Await pathology results.                           - Repeat upper endoscopy PRN for retreatment.                           - Return to GI office in 6 weeks.                           - Start PO iron, consider referral to hematology                            for iron infusions if unable to tolerate oral iron. Procedure Code(s):        --- Professional ---                           43255, 59, Esophagogastroduodenoscopy, flexible,                            transoral; with control of bleeding, any method                           43249, Esophagogastroduodenoscopy, flexible,                             transoral; with transendoscopic balloon dilation of                            esophagus (less than 30 mm diameter)                           43239, 59, Esophagogastroduodenoscopy, flexible,                            transoral; with biopsy, single or multiple Diagnosis Code(s):        --- Professional ---                           K44.9, Diaphragmatic hernia without obstruction or                            gangrene                           K22.2, Esophageal obstruction                           K29.70, Gastritis, unspecified, without bleeding                           K31.819, Angiodysplasia of stomach and duodenum                            without bleeding                           D50.9, Iron deficiency anemia, unspecified  R13.10, Dysphagia, unspecified                           K92.1, Melena (includes Hematochezia) CPT copyright 2022 American Medical Association. All rights reserved. The codes documented in this report are preliminary and upon coder review may  be revised to meet current compliance requirements. Rolando Cliche. Mordechai April, DO Rolando Cliche. Mordechai April, DO 06/12/2023 8:47:28 AM This report has been signed electronically. Number of Addenda: 0

## 2023-06-13 LAB — SURGICAL PATHOLOGY

## 2023-06-14 ENCOUNTER — Encounter (HOSPITAL_COMMUNITY): Payer: Self-pay | Admitting: Internal Medicine

## 2023-06-18 ENCOUNTER — Other Ambulatory Visit: Payer: Self-pay

## 2023-06-18 DIAGNOSIS — D509 Iron deficiency anemia, unspecified: Secondary | ICD-10-CM

## 2023-07-17 ENCOUNTER — Other Ambulatory Visit: Payer: Self-pay

## 2023-07-17 DIAGNOSIS — D509 Iron deficiency anemia, unspecified: Secondary | ICD-10-CM

## 2023-07-18 ENCOUNTER — Ambulatory Visit: Admitting: Urology

## 2023-07-25 ENCOUNTER — Encounter: Payer: Self-pay | Admitting: Urology

## 2023-07-25 ENCOUNTER — Ambulatory Visit: Admitting: Urology

## 2023-07-25 VITALS — BP 130/51 | HR 87

## 2023-07-25 DIAGNOSIS — R82998 Other abnormal findings in urine: Secondary | ICD-10-CM | POA: Diagnosis not present

## 2023-07-25 DIAGNOSIS — Z87440 Personal history of urinary (tract) infections: Secondary | ICD-10-CM

## 2023-07-25 DIAGNOSIS — N390 Urinary tract infection, site not specified: Secondary | ICD-10-CM

## 2023-07-25 LAB — URINALYSIS, ROUTINE W REFLEX MICROSCOPIC
Bilirubin, UA: NEGATIVE
Glucose, UA: NEGATIVE
Ketones, UA: NEGATIVE
Nitrite, UA: NEGATIVE
Protein,UA: NEGATIVE
RBC, UA: NEGATIVE
Specific Gravity, UA: <1.005 — ABNORMAL LOW (ref 1.005–1.030)
Urobilinogen, Ur: 0.2 mg/dL (ref 0.2–1.0)
pH, UA: 6 (ref 5.0–7.5)

## 2023-07-25 LAB — MICROSCOPIC EXAMINATION: Epithelial Cells (non renal): >10 /HPF — AB (ref 0–10)

## 2023-07-25 LAB — BLADDER SCAN AMB NON-IMAGING: Scan Result: 39

## 2023-07-25 MED ORDER — POLYETHYLENE GLYCOL 3350 17 G PO PACK: 17.0000 g | PACK | Freq: Every day | ORAL | 11 refills | Status: AC

## 2023-07-25 MED ORDER — MAGNESIUM CITRATE PO SOLN
1.0000 | Freq: Once | ORAL | 2 refills | Status: AC
Start: 2023-07-25 — End: 2023-07-25

## 2023-07-25 MED ORDER — NITROFURANTOIN MACROCRYSTAL 50 MG PO CAPS
50.0000 mg | ORAL_CAPSULE | Freq: Every day | ORAL | 11 refills | Status: DC
Start: 2023-07-25 — End: 2023-11-26

## 2023-07-25 NOTE — Progress Notes (Signed)
post void residual=39

## 2023-07-25 NOTE — Patient Instructions (Signed)

## 2023-07-25 NOTE — Progress Notes (Signed)
 07/25/2023 11:27 AM   [name] 1950-12-29 [id number]  Referring provider: Lanney Pitts, PA-C [address] [place],  [place] [address]  Frequent UTI   HPI: [name] is a 73yo here for evaluation of frequent UTI. She started having issues with presumed UTI over the past 5-6 months. She has been treated 8 times in the past 6 months. She gets better when given the antibiotics but her symptoms return soon after stopping the antibiotics. She has chronic bilateral lower back pain which worsens when she is diagnosed with a UTI. UA today is concerning for infection. She has issues with constipation and has a bowel movement every 3-4 days. CT 04/14/2023 showed no GU abnormalities.    PMH: Past Medical History:  Diagnosis Date   Anxiety    Asthma    COPD (chronic obstructive pulmonary disease) (HCC)    Hypertension    IBS (irritable bowel syndrome)     Surgical History: Past Surgical History:  Procedure Laterality Date   ABDOMINAL HYSTERECTOMY     BREAST SURGERY     left lumpectomy   ESOPHAGEAL DILATION N/A 06/12/2023   Procedure: DILATION, ESOPHAGUS;  Surgeon: [name], DO;  [facility];  Service: Endoscopy;  Laterality: N/A;   ESOPHAGOGASTRODUODENOSCOPY N/A 06/12/2023   Procedure: EGD (ESOPHAGOGASTRODUODENOSCOPY);  Surgeon: [name], DO;  [facility];  Service: Endoscopy;  Laterality: N/A;  800am, asa 2   HERNIA REPAIR  2008   umbilical hernia repair   REPLACEMENT TOTAL KNEE Left 2023   TONSILLECTOMY      Home Medications:  Allergies as of 07/25/2023       Reactions   Codeine Nausea And Vomiting   Egg-derived Products         Medication List        Accurate as of Jul 25, 2023 11:27 AM. If you have any questions, ask your nurse or doctor.          albuterol  108 (90 Base) MCG/ACT inhaler Commonly known as: VENTOLIN  HFA Inhale 2 puffs into the lungs every 6 (six) hours as needed for wheezing or shortness of  breath.   ALPRAZolam  0.5 MG tablet Commonly known as: XANAX  Take 0.5 mg by mouth 2 (two) times daily.   aspirin 500 MG tablet Take 500 mg by mouth every 6 (six) hours as needed for pain.   Aspirin-Caffeine 500-32.5 MG Tabs Take 1 tablet by mouth daily.   Breztri Aerosphere 160-9-4.8 MCG/ACT Aero inhaler Generic drug: budesonide -glycopyrrolate -formoterol Inhale 2 puffs into the lungs 2 (two) times daily.   docusate sodium 100 MG capsule Commonly known as: COLACE Take 100 mg by mouth daily.   escitalopram  20 MG tablet Commonly known as: LEXAPRO  Take 20 mg by mouth daily.   ferrous sulfate  325 (65 FE) MG tablet Take 325 mg by mouth daily with breakfast.   lisinopril -hydrochlorothiazide  20-12.5 MG tablet Commonly known as: ZESTORETIC  Take 1 tablet by mouth daily.   omeprazole 20 MG capsule Commonly known as: PRILOSEC Take 20 mg by mouth 2 (two) times daily.        Allergies:  Allergies  Allergen Reactions   Codeine Nausea And Vomiting   Egg-Derived Products     Family History: Family History  Problem Relation Age of Onset   Colon cancer Neg Hx    Pancreatic disease Neg Hx     Social History:  reports that she quit smoking about 54 years ago. Her smoking use included cigarettes. She started  smoking about 3 months ago. She has never used smokeless tobacco. She reports that she does not drink alcohol and does not use drugs.  ROS: All other review of systems were reviewed and are negative except what is noted above in HPI  Physical Exam: BP (!) 130/51   Pulse 87   Constitutional:  Alert and oriented, No acute distress. HEENT: [place] AT, moist mucus membranes.  Trachea midline, no masses. Cardiovascular: No clubbing, cyanosis, or edema. Respiratory: Normal respiratory effort, no increased work of breathing. GI: Abdomen is soft, nontender, nondistended, no abdominal masses GU: No CVA tenderness.  Lymph: No cervical or inguinal lymphadenopathy. Skin: No rashes,  bruises or suspicious lesions. Neurologic: Grossly intact, no focal deficits, moving all 4 extremities. Psychiatric: Normal mood and affect.  Laboratory Data: Lab Results  Component Value Date   WBC 5.0 06/05/2023   HGB 11.8 06/05/2023   HCT 37.7 06/05/2023   MCV 82 06/05/2023   PLT 180 06/05/2023    Lab Results  Component Value Date   CREATININE 0.67 06/05/2023    No results found for: "PSA"  No results found for: "TESTOSTERONE"  No results found for: "HGBA1C"  Urinalysis    Component Value Date/Time   COLORURINE YELLOW 04/16/2023 0619   APPEARANCEUR CLEAR 04/16/2023 0619   APPEARANCEUR Clear 06/06/2022 0950   LABSPEC 1.004 (L) 04/16/2023 0619   PHURINE 7.0 04/16/2023 0619   GLUCOSEU NEGATIVE 04/16/2023 0619   HGBUR SMALL (A) 04/16/2023 0619   BILIRUBINUR NEGATIVE 04/16/2023 0619   BILIRUBINUR Negative 06/06/2022 0950   KETONESUR NEGATIVE 04/16/2023 0619   PROTEINUR NEGATIVE 04/16/2023 0619   NITRITE NEGATIVE 04/16/2023 0619   LEUKOCYTESUR MODERATE (A) 04/16/2023 0619    Lab Results  Component Value Date   LABMICR Comment 06/06/2022   BACTERIA MANY (A) 04/16/2023    Pertinent Imaging: Ct 04/14/2023: Images reviewed and discussed with the patient  No results found for this or any previous visit.  No results found for this or any previous visit.  No results found for this or any previous visit.  No results found for this or any previous visit.  No results found for this or any previous visit.  No results found for this or any previous visit.  No results found for this or any previous visit.  Results for orders placed during the hospital encounter of 04/20/22  CT Renal Stone Study  Narrative CLINICAL DATA:  Abdominal/flank pain. Patient reports burning with urination and low back pain.  EXAM: CT ABDOMEN AND PELVIS WITHOUT CONTRAST  TECHNIQUE: Multidetector CT imaging of the abdomen and pelvis was performed following the standard protocol  without IV contrast.  RADIATION DOSE REDUCTION: This exam was performed according to the departmental dose-optimization program which includes automated exposure control, adjustment of the mA and/or kV according to patient size and/or use of iterative reconstruction technique.  COMPARISON:  CT 09/01/2019  FINDINGS: Lower chest: Allowing for breathing motion artifact, no acute basilar findings.  Hepatobiliary: Unremarkable unenhanced appearance of the liver. The gallbladder is decompressed. No calcified gallstone. No biliary dilatation.  Pancreas: Parenchymal atrophy. No ductal dilatation or inflammation.  Spleen: Normal in size without focal abnormality.  Adrenals/Urinary Tract: No adrenal nodule. No renal calculi. No hydronephrosis. No significant perinephric edema. No suspicious renal abnormality or focal lesion on this unenhanced exam. Partially distended urinary bladder. No bladder wall thickening or inflammation. No bladder stone.  Stomach/Bowel: Detailed bowel assessment is limited in the absence of enteric contrast. The stomach is decompressed. There is no  bowel obstruction or inflammation. Majority of the colon is nondistended limiting assessment. The appendix is not definitively seen, no evidence of appendicitis.  Vascular/Lymphatic: Normal caliber abdominal aorta with minimal atherosclerosis. No enlarged lymph nodes in the abdomen or pelvis.  Reproductive: Status post hysterectomy. No adnexal masses.  Other: Coarse calcification in the left upper quadrant of the abdomen adjacent to the stomach is unchanged from prior exam and consistent with benign etiology. No ascites, free air or focal fluid collection. Suspected prior umbilical hernia repair.  Musculoskeletal: Scoliosis and degenerative change in the spine. Degenerative changes most prominent at L3-L4 and L4-L5 with near complete disc space loss, vacuum phenomenon and Modic endplate changes. There are no  acute or suspicious osseous abnormalities. No soft tissue abnormalities to account for pain.  IMPRESSION: No renal stones or obstructive uropathy. No acute abnormality in the abdomen/pelvis.  Aortic Atherosclerosis (ICD10-I70.0).   Electronically Signed By: Chadwick Colonel M.D. On: 04/20/2022 18:14   Assessment & Plan:    1. Urinary tract infection without hematuria, site unspecified (Primary) -We discussed the natural hx of recurrent UTIs and the various causes. We discussed the treatment options including post coital prophylaxis, daily prophylaxis, topical estrogen therapy. After discussing the options the patient elects for prophylaxis plus constipation management. She will start miralax 17g daily and a probiotic.  - Urinalysis, Routine w reflex microscopic - BLADDER SCAN AMB NON-IMAGING   No follow-ups on file.  Johnie Nailer, MD  [facility] [place]

## 2023-07-27 LAB — URINE CULTURE

## 2023-07-31 ENCOUNTER — Ambulatory Visit: Payer: Self-pay

## 2023-07-31 LAB — CBC WITH DIFFERENTIAL/PLATELET
Basophils Absolute: 0.1 10*3/uL (ref 0.0–0.2)
Basos: 1 %
EOS (ABSOLUTE): 0.1 10*3/uL (ref 0.0–0.4)
Eos: 2 %
Hematocrit: 40.8 % (ref 34.0–46.6)
Hemoglobin: 13.6 g/dL (ref 11.1–15.9)
Immature Grans (Abs): 0 10*3/uL (ref 0.0–0.1)
Immature Granulocytes: 0 %
Lymphocytes Absolute: 1.5 10*3/uL (ref 0.7–3.1)
Lymphs: 24 %
MCH: 27.3 pg (ref 26.6–33.0)
MCHC: 33.3 g/dL (ref 31.5–35.7)
MCV: 82 fL (ref 79–97)
Monocytes Absolute: 0.5 10*3/uL (ref 0.1–0.9)
Monocytes: 9 %
Neutrophils Absolute: 3.8 10*3/uL (ref 1.4–7.0)
Neutrophils: 64 %
Platelets: 224 10*3/uL (ref 150–450)
RBC: 4.99 x10E6/uL (ref 3.77–5.28)
RDW: 13.9 % (ref 11.7–15.4)
WBC: 6 10*3/uL (ref 3.4–10.8)

## 2023-07-31 LAB — FERRITIN: Ferritin: 65 ng/mL (ref 15–150)

## 2023-08-07 ENCOUNTER — Ambulatory Visit: Payer: Self-pay | Admitting: Gastroenterology

## 2023-08-07 ENCOUNTER — Encounter: Payer: Self-pay | Admitting: Gastroenterology

## 2023-08-07 ENCOUNTER — Ambulatory Visit: Admitting: Gastroenterology

## 2023-08-07 VITALS — BP 162/70 | HR 61 | Temp 98.7°F | Ht 61.0 in | Wt 139.2 lb

## 2023-08-07 DIAGNOSIS — R1013 Epigastric pain: Secondary | ICD-10-CM | POA: Insufficient documentation

## 2023-08-07 DIAGNOSIS — K5900 Constipation, unspecified: Secondary | ICD-10-CM

## 2023-08-07 DIAGNOSIS — K219 Gastro-esophageal reflux disease without esophagitis: Secondary | ICD-10-CM

## 2023-08-07 DIAGNOSIS — R1310 Dysphagia, unspecified: Secondary | ICD-10-CM

## 2023-08-07 DIAGNOSIS — R748 Abnormal levels of other serum enzymes: Secondary | ICD-10-CM

## 2023-08-07 DIAGNOSIS — L290 Pruritus ani: Secondary | ICD-10-CM

## 2023-08-07 DIAGNOSIS — D509 Iron deficiency anemia, unspecified: Secondary | ICD-10-CM | POA: Diagnosis not present

## 2023-08-07 DIAGNOSIS — R932 Abnormal findings on diagnostic imaging of liver and biliary tract: Secondary | ICD-10-CM

## 2023-08-07 DIAGNOSIS — R151 Fecal smearing: Secondary | ICD-10-CM

## 2023-08-07 DIAGNOSIS — Q453 Other congenital malformations of pancreas and pancreatic duct: Secondary | ICD-10-CM | POA: Insufficient documentation

## 2023-08-07 MED ORDER — KETOCONAZOLE 2 % EX CREA: TOPICAL_CREAM | CUTANEOUS | 0 refills | Status: DC

## 2023-08-07 NOTE — Patient Instructions (Signed)
 Reduce iron  (ferrous sulfate ) to every other day. Continue omeprazole 20mg  twice daily before a meal. Reduce miralax  to 1/2 packet daily. CT scan of your pancreas to be scheduled. Colonoscopy to be scheduled. YOU WILL NEED TO HOLD IRON  FOR SEVEN DAYS BEFORE YOUR PROCEDURE BUT YOU WILL RESUME AFTERWARDS. Update CBC, ferritin in four months.

## 2023-08-07 NOTE — Progress Notes (Signed)
 GI Office Note    Referring Provider: Lenn Quint, NP Primary Care Physician:  Lenn Quint, NP  Primary Gastroenterologist: Rolando Cliche. Mordechai April, DO   Chief Complaint   Chief Complaint  Patient presents with   Follow-up    Follow up on fecal smearing, pt is taking Miralax  for constipation    History of Present Illness   [name] is a 73 y.o. female presenting today for follow up. Last seen 05/2023.    History of right sided abd pain in setting of rcurrent UTIs, radiating from back to front. H/o IDA/melena, dysphagia, constipation, abnormal pancreas on CT, elevated Lipase. Patient previously declined colonoscopy. EGD completed in 05/2023 as outlined below.    Today: started on miralax  17g daily about 7-10 days ago. Before starting she had become significantly constipated. First couple of doses of miralax  she had vomiting. Bowels are now moving. BM every other day, may be 1-2 times that day. Stools softer. Now complains of fecal seepage. Has to wear panty liners. Sometimes has to change her underwear. Complains of anorectal irritation since this started. No melena, brbpr. Seems to be tolerating iron . No heartburn. No dysphagia. Continues to have ongoing epigastric pain. She is open to having a colonoscopy now since her stools have changed.   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  States that the colonoscopy was not necessary as it was her hernia causing her issues total time.  Records show attempted colonoscopy 04/28/2002 limited to do flexible sigmoidoscopy, mild changes of melanosis coli noted.  Colonoscopy in November 2007 with minimal internal hemorrhoids noted, hyperplastic rectal polyp.  EGD May 2006 with small sliding hiatal hernia.    Labs from February 2025: Lipase 71H, white blood cell count 11,300, hemoglobin 8.7, MCV 68.4, platelets 368,000, sodium 120, creatinine 0.55, total bilirubin 0.6, alk phos 63, AST 11, ALT 13, magnesium  less than 0.5, ferritin 6, iron  19, TIBC 501, iron  sats 4%.  Hemoglobin prior to discharge was 9.1.  Labs 07/2023: Hgb 13.6, ferritin 65.    RUQ U/S 03/2023: IMPRESSION: 1. No sonographic etiology for abdominal pain is identified. 2. Heterogeneous and overall slightly increased hepatic parenchymal echogenicity as can be seen in hepatic steatosis.   CT A/P with contrast 03/2023; IMPRESSION: 1. Motion artifact limits assessment. 2. Possible mild edema adjacent to the pancreatic tail, can be seen with acute pancreatitis. Recommend correlation with pancreatic enzymes.  Medications   Current Outpatient Medications  Medication Sig Dispense Refill   albuterol  (PROVENTIL  HFA;VENTOLIN  HFA) 108 (90 BASE) MCG/ACT inhaler Inhale 2 puffs into the lungs every 6 (six) hours as needed for wheezing or shortness of breath.     ALPRAZolam  (XANAX ) 0.5 MG tablet Take 0.5 mg by mouth 2 (two) times daily.     BREZTRI AEROSPHERE 160-9-4.8 MCG/ACT AERO Inhale 2 puffs into the lungs 2 (two) times daily.     escitalopram  (LEXAPRO ) 20 MG tablet Take 20 mg by mouth daily.     ferrous sulfate  325 (65 FE) MG tablet Take 325 mg by mouth daily with breakfast.     lisinopril -hydrochlorothiazide  (ZESTORETIC ) 20-12.5 MG tablet Take 1 tablet by mouth daily.     nitrofurantoin  (MACRODANTIN ) 50 MG capsule Take 1 capsule (50 mg total) by mouth at bedtime. 30 capsule 11  omeprazole (PRILOSEC) 20 MG capsule Take 20 mg by mouth 2 (two) times daily.     polyethylene glycol (MIRALAX ) 17 g packet Take 17 g by mouth daily. 30 each 11   No current facility-administered medications for this visit.    Allergies   Allergies as of 08/07/2023 - Review Complete 08/07/2023  Allergen Reaction Noted   Codeine Nausea And Vomiting  05/19/2013   Egg-derived products  05/19/2013     Past Medical History   Past Medical History:  Diagnosis Date   Anxiety    Asthma    COPD (chronic obstructive pulmonary disease) (HCC)    Hypertension    IBS (irritable bowel syndrome)    IDA (iron  deficiency anemia)     Past Surgical History   Past Surgical History:  Procedure Laterality Date   ABDOMINAL HYSTERECTOMY     BREAST SURGERY     left lumpectomy   ESOPHAGEAL DILATION N/A 06/12/2023   Procedure: DILATION, ESOPHAGUS;  Surgeon: [name], DO;  [facility];  Service: Endoscopy;  Laterality: N/A;   ESOPHAGOGASTRODUODENOSCOPY N/A 06/12/2023   Procedure: EGD (ESOPHAGOGASTRODUODENOSCOPY);  Surgeon: [name], DO;  [facility];  Service: Endoscopy;  Laterality: N/A;  800am, asa 2   HERNIA REPAIR  2008   umbilical hernia repair   REPLACEMENT TOTAL KNEE Left 2023   TONSILLECTOMY      Past Family History   Family History  Problem Relation Age of Onset   Colon cancer Neg Hx    Pancreatic disease Neg Hx     Past Social History   Social History   Socioeconomic History   Marital status: Divorced    Spouse name: Not on file   Number of children: Not on file   Years of education: Not on file   Highest education level: Not on file  Occupational History   Not on file  Tobacco Use   Smoking status: Former    Types: Cigarettes    Start date: 03/31/2023    Quit date: 02/27/1969    Years since quitting: 54.4   Smokeless tobacco: Never  Vaping Use   Vaping status: Never Used  Substance and Sexual Activity   Alcohol use: No   Drug use: No   Sexual activity: Not on file  Other Topics Concern   Not on file  Social History Narrative   Not on file   Social Drivers of Health   Financial Resource Strain: Low Risk  (07/18/2021)   Received from [facility], [facility]   Overall Financial Resource Strain (CARDIA)    Difficulty of Paying Living Expenses: Not very hard  Food  Insecurity: No Food Insecurity (04/14/2023)   Hunger Vital Sign    Worried About Running Out of Food in the Last Year: Never true    Ran Out of Food in the Last Year: Never true  Transportation Needs: No Transportation Needs (04/14/2023)   PRAPARE - Administrator, Civil Service (Medical): No    Lack of Transportation (Non-Medical): No  Physical Activity: Not on file  Stress: No Stress Concern Present (07/18/2021)   Received from [facility], [facility] - Occupational Stress Questionnaire    Feeling of Stress : Only a little  Social Connections: Unknown (04/14/2023)   Social Connection and Isolation Panel [NHANES]    Frequency of Communication with Friends and Family: Once a week    Frequency of Social Gatherings with Friends and  Family: Once a week    Attends Religious Services: Not on file    Active Member of Clubs or Organizations: Yes    Attends Club or Organization Meetings: Patient unable to answer    Marital Status: Not on file  Intimate Partner Violence: Not At Risk (04/14/2023)   Humiliation, Afraid, Rape, and Kick questionnaire    Fear of Current or Ex-Partner: No    Emotionally Abused: No    Physically Abused: No    Sexually Abused: No    Review of Systems   General: Negative for anorexia, weight loss, fever, chills, fatigue, weakness. ENT: Negative for hoarseness, difficulty swallowing , nasal congestion. CV: Negative for chest pain, angina, palpitations, dyspnea on exertion, peripheral edema.  Respiratory: Negative for dyspnea at rest, dyspnea on exertion, cough, sputum, +wheezing.  GI: See history of present illness. GU:  Negative for dysuria, hematuria, urinary incontinence, urinary frequency, nocturnal urination. See hpi Endo: Negative for unusual weight change.     Physical Exam   BP (!) 162/70   Pulse 61   Temp 98.7 F (37.1 C)   Ht 5\' 1"  (1.549 m)   Wt 139 lb 3.2 oz (63.1 kg)   BMI 26.30 kg/m     General: Well-nourished, well-developed in no acute distress.  Eyes: No icterus. Mouth: Oropharyngeal mucosa moist and pink   Lungs: scattered wheeze  Heart: Regular rate and rhythm, no murmurs rubs or gallops.  Abdomen: Bowel sounds are normal,  nondistended, no hepatosplenomegaly or masses,  no abdominal bruits or hernia , no rebound or guarding. Mild epig pain Rectal: not performed. Patient declined Extremities: No lower extremity edema. No clubbing or deformities. Neuro: Alert and oriented x 4   Skin: Warm and dry, no jaundice.   Psych: Alert and cooperative, normal mood and affect.  Labs   Lab Results  Component Value Date   WBC 6.0 07/30/2023   HGB 13.6 07/30/2023   HCT 40.8 07/30/2023   MCV 82 07/30/2023   PLT 224 07/30/2023   Lab Results  Component Value Date   NA 140 06/05/2023   CL 99 06/05/2023   K 4.0 06/05/2023   CO2 25 06/05/2023   BUN 7 (L) 06/05/2023   CREATININE 0.67 06/05/2023   EGFR 92 06/05/2023   CALCIUM  8.7 06/05/2023   PHOS 3.7 04/15/2023   ALBUMIN 4.3 06/05/2023   GLUCOSE 105 (H) 06/05/2023   Lab Results  Component Value Date   ALT 8 06/05/2023   AST 8 06/05/2023   ALKPHOS 68 06/05/2023   BILITOT 0.2 06/05/2023   Lab Results  Component Value Date   LIPASE 61 06/05/2023   Lab Results  Component Value Date   IRON  22 (L) 06/05/2023   TIBC 368 06/05/2023   FERRITIN 65 07/30/2023    Imaging Studies   No results found.  Assessment/Plan:   Esophageal dysphagia: better after esophageal dilation  Epigastric pain/abnormal pancreas on CT/lipase elevated: may be multifactorial in setting of gastritis, constipation, but given prior abnormal appearing pancreas on CT, would recommend repeat CT with pancreatic protocol to rule out underlying lesion.  Constipation/fecal smearing: worsening constipation over past several months. Now with fecal smearing, likely due to weakened tone and soft stools (on miralax ). Patient declined rectal exam  today. Discussed need to consider overflow due to fecal impaction or even rectal mass as possible causes. She declined rectal exam but is agreeable to colonoscopy. Last one in 2007. -colonoscopy . ASA 3. Room1/2 ok.  I have discussed the risks, alternatives,  benefits with regards to but not limited to the risk of reaction to medication, bleeding, infection, perforation and the patient is agreeable to proceed. Written consent to be obtained. -cut back on miralax  1/2 packet daily to see if stools better controlled   IDA: s/p recent EGD with multiple angiectasias ablated with APC. Doing well on oral iron . Previously advised colonoscopy but patient declined, now agreeable.  -decrease iron  to every other day -recheck CBC, ferritin in four months.  Gerd/gastritis: doing well. Continue PPI BID for now.  Perianal rash: per patient but she would not allow exam -ketoconazole 2% apply anorectally bid, continue 2 days after rash gone.   Trudie Fuse. Harles Lied, MHS, PA-C [facility]

## 2023-08-08 ENCOUNTER — Ambulatory Visit (HOSPITAL_COMMUNITY)
Admission: RE | Admit: 2023-08-08 | Discharge: 2023-08-08 | Disposition: A | Discharge status: Home / Self Care | Source: Ambulatory Visit | Attending: Gastroenterology | Admitting: Gastroenterology

## 2023-08-08 DIAGNOSIS — D509 Iron deficiency anemia, unspecified: Secondary | ICD-10-CM | POA: Diagnosis present

## 2023-08-08 DIAGNOSIS — R151 Fecal smearing: Secondary | ICD-10-CM | POA: Diagnosis present

## 2023-08-08 DIAGNOSIS — K219 Gastro-esophageal reflux disease without esophagitis: Secondary | ICD-10-CM | POA: Diagnosis present

## 2023-08-08 DIAGNOSIS — R1013 Epigastric pain: Secondary | ICD-10-CM | POA: Insufficient documentation

## 2023-08-08 DIAGNOSIS — Q453 Other congenital malformations of pancreas and pancreatic duct: Secondary | ICD-10-CM | POA: Diagnosis present

## 2023-08-08 DIAGNOSIS — K5900 Constipation, unspecified: Secondary | ICD-10-CM | POA: Insufficient documentation

## 2023-08-08 MED ORDER — IOHEXOL 300 MG/ML  SOLN
80.0000 mL | Freq: Once | INTRAMUSCULAR | Status: AC | PRN
Start: 2023-08-08 — End: 2023-08-08
  Administered 2023-08-08: 80 mL via INTRAVENOUS

## 2023-08-09 ENCOUNTER — Other Ambulatory Visit: Payer: Self-pay | Admitting: *Deleted

## 2023-08-09 ENCOUNTER — Encounter: Payer: Self-pay | Admitting: *Deleted

## 2023-08-09 DIAGNOSIS — R151 Fecal smearing: Secondary | ICD-10-CM

## 2023-08-09 DIAGNOSIS — R194 Change in bowel habit: Secondary | ICD-10-CM

## 2023-08-09 MED ORDER — PEG 3350-KCL-NA BICARB-NACL 420 G PO SOLR: 4000.0000 mL | Freq: Once | ORAL | 0 refills | Status: AC

## 2023-08-09 NOTE — Addendum Note (Signed)
 Addended by: Alvester Johnson on: 08/09/2023 08:20 AM   Modules accepted: Orders

## 2023-08-09 NOTE — Addendum Note (Signed)
 Addended by: Alvester Johnson on: 08/09/2023 08:21 AM   Modules accepted: Orders

## 2023-08-17 ENCOUNTER — Ambulatory Visit: Payer: Self-pay | Admitting: Gastroenterology

## 2023-08-20 ENCOUNTER — Other Ambulatory Visit: Payer: Self-pay

## 2023-08-20 DIAGNOSIS — K8689 Other specified diseases of pancreas: Secondary | ICD-10-CM

## 2023-08-22 ENCOUNTER — Telehealth: Payer: Self-pay | Admitting: Gastroenterology

## 2023-08-22 NOTE — Telephone Encounter (Signed)
 Please let patient know that I spoke with Dr. Cindie, he is advising to cancel colonoscopy on 7/8 and move out towards first of August. EUS most important right now.

## 2023-08-22 NOTE — Telephone Encounter (Signed)
 Pt aware and agreeable. Procedure cancelled.

## 2023-08-29 ENCOUNTER — Encounter (HOSPITAL_COMMUNITY): Payer: Self-pay | Admitting: Gastroenterology

## 2023-08-29 ENCOUNTER — Telehealth: Payer: Self-pay | Admitting: Gastroenterology

## 2023-08-29 NOTE — Progress Notes (Signed)
 Attempted to obtain medical history for pre op call via telephone, unable to reach at this time. HIPAA compliant voicemail message left requesting return call to pre surgical testing department.

## 2023-08-29 NOTE — Telephone Encounter (Signed)
 Procedure:EUS Procedure date: 09/06/23 Procedure location: [facility] Arrival Time: 11:45 am Spoke with the patient Y/N:   No, I left a detailed message 08/29/23 @ 1:47 pm for the patient to return call    Any prep concerns? ___  Has the patient obtained the prep from the pharmacy ? ___ Do you have a care partner and transportation: ___ Any additional concerns? ___

## 2023-08-30 NOTE — Telephone Encounter (Signed)
 Patient confirmed appt.

## 2023-09-04 ENCOUNTER — Ambulatory Visit (HOSPITAL_COMMUNITY): Admit: 2023-09-04 | Admitting: Internal Medicine

## 2023-09-04 ENCOUNTER — Encounter (HOSPITAL_COMMUNITY): Payer: Self-pay

## 2023-09-04 SURGERY — COLONOSCOPY
Anesthesia: Choice

## 2023-09-04 NOTE — Telephone Encounter (Signed)
 Spoke with pt. RESCHEDULED PROCEDURE TO 8/26. She already has her prep. Advised will send new instructions to her.

## 2023-09-06 ENCOUNTER — Other Ambulatory Visit: Payer: Self-pay

## 2023-09-06 ENCOUNTER — Encounter (HOSPITAL_COMMUNITY): Payer: Self-pay | Admitting: Gastroenterology

## 2023-09-06 ENCOUNTER — Ambulatory Visit (HOSPITAL_COMMUNITY): Admitting: Anesthesiology

## 2023-09-06 ENCOUNTER — Encounter (HOSPITAL_COMMUNITY)
Admission: RE | Disposition: A | Discharge status: Home / Self Care | Payer: Self-pay | Source: Home / Self Care | Attending: Gastroenterology

## 2023-09-06 ENCOUNTER — Ambulatory Visit (HOSPITAL_COMMUNITY)
Admission: RE | Admit: 2023-09-06 | Discharge: 2023-09-06 | Disposition: A | Discharge status: Home / Self Care | Attending: Gastroenterology | Admitting: Gastroenterology

## 2023-09-06 DIAGNOSIS — Z87891 Personal history of nicotine dependence: Secondary | ICD-10-CM | POA: Insufficient documentation

## 2023-09-06 DIAGNOSIS — J4489 Other specified chronic obstructive pulmonary disease: Secondary | ICD-10-CM | POA: Diagnosis not present

## 2023-09-06 DIAGNOSIS — K219 Gastro-esophageal reflux disease without esophagitis: Secondary | ICD-10-CM | POA: Diagnosis not present

## 2023-09-06 DIAGNOSIS — K8689 Other specified diseases of pancreas: Secondary | ICD-10-CM | POA: Diagnosis present

## 2023-09-06 DIAGNOSIS — K2971 Gastritis, unspecified, with bleeding: Secondary | ICD-10-CM

## 2023-09-06 DIAGNOSIS — C251 Malignant neoplasm of body of pancreas: Secondary | ICD-10-CM | POA: Diagnosis not present

## 2023-09-06 DIAGNOSIS — J441 Chronic obstructive pulmonary disease with (acute) exacerbation: Secondary | ICD-10-CM

## 2023-09-06 DIAGNOSIS — K2289 Other specified disease of esophagus: Secondary | ICD-10-CM

## 2023-09-06 DIAGNOSIS — K449 Diaphragmatic hernia without obstruction or gangrene: Secondary | ICD-10-CM

## 2023-09-06 DIAGNOSIS — I899 Noninfective disorder of lymphatic vessels and lymph nodes, unspecified: Secondary | ICD-10-CM | POA: Diagnosis not present

## 2023-09-06 DIAGNOSIS — K2970 Gastritis, unspecified, without bleeding: Secondary | ICD-10-CM

## 2023-09-06 DIAGNOSIS — I10 Essential (primary) hypertension: Secondary | ICD-10-CM | POA: Diagnosis not present

## 2023-09-06 HISTORY — PX: EUS: SHX5427

## 2023-09-06 HISTORY — PX: ESOPHAGOGASTRODUODENOSCOPY: SHX5428

## 2023-09-06 HISTORY — PX: FINE NEEDLE ASPIRATION: SHX6590

## 2023-09-06 SURGERY — ULTRASOUND, UPPER GI TRACT, ENDOSCOPIC
Anesthesia: Monitor Anesthesia Care

## 2023-09-06 MED ORDER — PROPOFOL 10 MG/ML IV BOLUS
INTRAVENOUS | Status: DC | PRN
  Administered 2023-09-06: 30 mg via INTRAVENOUS
  Administered 2023-09-06: 20 mg via INTRAVENOUS

## 2023-09-06 MED ORDER — PROPOFOL 500 MG/50ML IV EMUL
INTRAVENOUS | Status: DC | PRN
Start: 2023-09-06 — End: 2023-09-06
  Administered 2023-09-06: 150 ug/kg/min via INTRAVENOUS

## 2023-09-06 MED ORDER — SODIUM CHLORIDE 0.9 % IV SOLN
INTRAVENOUS | Status: AC | PRN
  Administered 2023-09-06: 500 mL via INTRAMUSCULAR

## 2023-09-06 MED ORDER — FENTANYL CITRATE (PF) 100 MCG/2ML IJ SOLN
INTRAMUSCULAR | Status: DC | PRN
  Administered 2023-09-06: 25 ug via INTRAVENOUS

## 2023-09-06 MED ORDER — FENTANYL CITRATE (PF) 100 MCG/2ML IJ SOLN
INTRAMUSCULAR | Status: AC
  Filled 2023-09-06: qty 2

## 2023-09-06 MED ORDER — OMEPRAZOLE 40 MG PO CPDR: 40.0000 mg | DELAYED_RELEASE_CAPSULE | Freq: Two times a day (BID) | ORAL | 6 refills | Status: AC

## 2023-09-06 MED ORDER — LIDOCAINE 2% (20 MG/ML) 5 ML SYRINGE
INTRAMUSCULAR | Status: DC | PRN
  Administered 2023-09-06: 100 mg via INTRAVENOUS

## 2023-09-06 MED ORDER — OMEPRAZOLE 40 MG PO CPDR
40.0000 mg | DELAYED_RELEASE_CAPSULE | Freq: Every day | ORAL | 6 refills | Status: DC
Start: 2023-09-06 — End: 2023-09-06

## 2023-09-06 MED ORDER — SODIUM CHLORIDE 0.9 % IV SOLN: INTRAVENOUS | Status: DC

## 2023-09-06 NOTE — Discharge Instructions (Addendum)

## 2023-09-06 NOTE — Anesthesia Preprocedure Evaluation (Signed)
 Anesthesia Evaluation  Patient identified by MRN, date of birth, ID band Patient awake    Reviewed: Allergy & Precautions, NPO status , Patient's Chart, lab work & pertinent test results  Airway Mallampati: I  TM Distance: >3 FB     Dental  (+) Missing, Poor Dentition, Chipped, Dental Advisory Given   Pulmonary asthma , COPD,  COPD inhaler, former smoker   breath sounds clear to auscultation + decreased breath sounds      Cardiovascular hypertension, Pt. on medications Normal cardiovascular exam Rhythm:Regular Rate:Normal  EKG 03/2023 NSR, possible left atrial enlargement RAD   Neuro/Psych  PSYCHIATRIC DISORDERS Anxiety Depression    negative neurological ROS     GI/Hepatic Neg liver ROS,GERD  Medicated,,Pancreatic mass   Endo/Other  negative endocrine ROS    Renal/GU negative Renal ROS  negative genitourinary   Musculoskeletal negative musculoskeletal ROS (+)    Abdominal   Peds  Hematology  (+) Blood dyscrasia, anemia   Anesthesia Other Findings   Reproductive/Obstetrics                              Anesthesia Physical Anesthesia Plan  ASA: 3  Anesthesia Plan: MAC   Post-op Pain Management: Minimal or no pain anticipated   Induction: Intravenous  PONV Risk Score and Plan: 3 and Treatment may vary due to age or medical condition and Propofol  infusion  Airway Management Planned: Natural Airway, Simple Face Mask and Nasal Cannula  Additional Equipment: None  Intra-op Plan:   Post-operative Plan:   Informed Consent: I have reviewed the patients History and Physical, chart, labs and discussed the procedure including the risks, benefits and alternatives for the proposed anesthesia with the patient or authorized representative who has indicated his/her understanding and acceptance.     Dental advisory given  Plan Discussed with: CRNA and Anesthesiologist  Anesthesia Plan  Comments:          Anesthesia Quick Evaluation

## 2023-09-06 NOTE — Op Note (Signed)
 [facility] Patient Name: [name] Procedure Date: 09/06/2023 MRN: [id number] Attending MD: [name] , MD, [id number] Date of Birth: Apr 11, 1950 CSN: [id number] Age: 73 Admit Type: Outpatient Procedure:                Upper EUS Indications:              Dilated pancreatic duct on CT scan, Suspected mass                            in pancreas on CT scan Providers:                [name], MD, Hoy Penner, RN, Farris Southgate, Technician Referring MD:              Medicines:                Monitored Anesthesia Care Complications:            No immediate complications. Estimated Blood Loss:     Estimated blood loss was minimal. Procedure:                Pre-Anesthesia Assessment:                           - Prior to the procedure, a History and Physical                            was performed, and patient medications and                            allergies were reviewed. The patient's tolerance of                            previous anesthesia was also reviewed. The risks                            and benefits of the procedure and the sedation                            options and risks were discussed with the patient.                            All questions were answered, and informed consent                            was obtained. Prior Anticoagulants: The patient has                            taken no anticoagulant or antiplatelet agents. ASA                            Grade Assessment: III - A patient with severe  systemic disease. After reviewing the risks and                            benefits, the patient was deemed in satisfactory                            condition to undergo the procedure.                           After obtaining informed consent, the endoscope was                            passed under direct vision. Throughout the                            procedure,  the patient's blood pressure, pulse, and                            oxygen saturations were monitored continuously. The                            [id number]-H190 ([id number]) Olympus endoscope was introduced                            through the mouth, and advanced to the second part                            of duodenum. The [id number]-[id number] ([id number]) Olympus                            duodenoscope was introduced through the mouth, and                            advanced to the area of papilla. The [id number]                            ([id number]) Olympus linear ultrasound scope was                            introduced through the mouth, and advanced to the                            duodenum for ultrasound examination from the                            stomach and duodenum. The upper EUS was                            accomplished without difficulty. The patient                            tolerated the procedure. Scope In: Scope Out: Findings:      ENDOSCOPIC FINDING: :      No gross lesions were noted in the  entire esophagus.      The Z-line was irregular and was found 35 cm from the incisors.      A 2 cm hiatal hernia was present.      Patchy moderate inflammation characterized by erosions, erythema,       friability and granularity was found in the entire examined stomach.       Biopsies were taken with a cold forceps for histology and Helicobacter       pylori testing.      No gross lesions were noted in the duodenal bulb, in the first portion       of the duodenum and in the second portion of the duodenum.      The major papilla was normal.      ENDOSONOGRAPHIC FINDING: :      A round lesion was identified in the pancreatic body. The lesion was       hypoechoic. The lesion measured 14 mm by 13 mm in maximal       cross-sectional diameter. The outer margins were smooth. There was       sonographic evidence suggesting abutment of the splenic artery and       splenic vein. An intact interface  was seen between the mass and the       superior mesenteric artery, celiac trunk and splenoportal confluence       suggesting a lack of invasion. The remainder of the pancreas was       examined. The endosonographic appearance of parenchyma and the upstream       pancreatic duct indicated duct dilation (up to 4 mm) and parenchymal       atrophy. Fine needle biopsy was performed. Color Doppler imaging was       utilized prior to needle puncture to confirm a lack of significant       vascular structures within the needle path. Six passes were made with       the 22 gauge Acquire-S biopsy needle using a transgastric approach. A       visible core of tissue was obtained. Preliminary cytologic examination       and touch preps were performed. Final cytology results are pending.      There was no sign of significant endosonographic abnormality in the       common bile duct (1.6 -> 4.1 mm) and in the common hepatic duct (5.4       mm). An unremarkable gallbladder was identified.      Endosonographic imaging of the ampulla showed no intramural       (subepithelial) lesion.      Endosonographic imaging in the visualized portion of the liver showed no       mass.      No malignant-appearing lymph nodes were visualized in the celiac region       (level 20), peripancreatic region and porta hepatis region.      The celiac region was visualized. Impression:               EGD impression:                           - No gross lesions in the entire esophagus. Z-line                            irregular, 35 cm from the incisors.                           -  2 cm hiatal hernia.                           - Gastritis. Biopsied.                           - No gross lesions in the duodenal bulb, in the                            first portion of the duodenum and in the second                            portion of the duodenum.                           - Normal major papilla.                           EUS  impression:                           - A lesion was identified in the pancreatic body.                            Cytology results are pending. However, the                            endosonographic appearance is suspicious for a                            malignancy and with lesion being much more rounded                            I am concerned about potential neuroendocrine tumor                            slightly more than adenocarcinoma. This was staged                            T1 N0 Mx by endosonographic criteria. The staging                            applies if malignancy is confirmed. Fine needle                            biopsy performed.                           - There was no sign of significant pathology in the                            common bile duct and in the common hepatic duct.                           - No malignant-appearing lymph nodes  were                            visualized in the celiac region (level 20),                            peripancreatic region and porta hepatis region. Moderate Sedation:      Not Applicable - Patient had care per Anesthesia. Recommendation:           - The patient will be observed post-procedure,                            until all discharge criteria are met.                           - Discharge patient to home.                           - Patient has a contact number available for                            emergencies. The signs and symptoms of potential                            delayed complications were discussed with the                            patient. Return to normal activities tomorrow.                            Written discharge instructions were provided to the                            patient.                           - Low fat diet for next few days.                           - Observe patient's clinical course.                           - Await cytology results and await path results.                            - Start omeprazole  40 mg daily.                           - Should cytology returned showing evidence of                            malignancy, further imaging including                            cross-sectional CT as well as referrals to oncology  and surgical oncology will be made.                           - The findings and recommendations were discussed                            with the patient.                           - The findings and recommendations were discussed                            with the patient's family. Procedure Code(s):        --- Professional ---                           [phone], Esophagogastroduodenoscopy, flexible,                            transoral; with transendoscopic ultrasound-guided                            intramural or transmural fine needle                            aspiration/biopsy(s), (includes endoscopic                            ultrasound examination limited to the esophagus,                            stomach or duodenum, and adjacent structures)                           43239, 59, Esophagogastroduodenoscopy, flexible,                            transoral; with biopsy, single or multiple Diagnosis Code(s):        --- Professional ---                           K22.89, Other specified disease of esophagus                           K44.9, Diaphragmatic hernia without obstruction or                            gangrene                           K29.70, Gastritis, unspecified, without bleeding                           K86.89, Other specified diseases of pancreas                           I89.9, Noninfective disorder of lymphatic vessels  and lymph nodes, unspecified                           R93.3, Abnormal findings on diagnostic imaging of                            other parts of digestive tract CPT copyright 2022 American [employer] Association. All rights reserved. The codes  documented in this report are preliminary and upon coder review may  be revised to meet current compliance requirements. [name], MD 09/06/2023 3:36:17 PM Number of Addenda: 0

## 2023-09-06 NOTE — Transfer of Care (Signed)
 Immediate Anesthesia Transfer of Care Note  Patient: [name]  Procedure(s) Performed: Procedure(s): ULTRASOUND, UPPER GI TRACT, ENDOSCOPIC (N/A) EGD (ESOPHAGOGASTRODUODENOSCOPY) (N/A) FINE NEEDLE ASPIRATION  Patient Location: PACU  Anesthesia Type:MAC  Level of Consciousness:  sedated, patient cooperative and responds to stimulation  Airway & Oxygen Therapy:Patient Spontanous Breathing and Patient connected to face mask oxgen  Post-op Assessment:  Report given to PACU RN and Post -op Vital signs reviewed and stable  Post vital signs:  Reviewed and stable  Last Vitals:  Vitals:   09/06/23 1109 09/06/23 1529  BP: (!) 160/48 (!) 152/62  Pulse: 63 73  Resp: 18 15  Temp: 36.6 C   SpO2: 98% 100%    Complications: No apparent anesthesia complications

## 2023-09-06 NOTE — Anesthesia Postprocedure Evaluation (Signed)
 Anesthesia Post Note  Patient: [name]  Procedure(s) Performed: ULTRASOUND, UPPER GI TRACT, ENDOSCOPIC EGD (ESOPHAGOGASTRODUODENOSCOPY) FINE NEEDLE ASPIRATION     Patient location during evaluation: Endoscopy Anesthesia Type: MAC Level of consciousness: awake and alert, oriented and patient cooperative Pain management: pain level controlled Vital Signs Assessment: post-procedure vital signs reviewed and stable Respiratory status: spontaneous breathing, nonlabored ventilation and respiratory function stable Cardiovascular status: stable and blood pressure returned to baseline Postop Assessment: no apparent nausea or vomiting Anesthetic complications: no   No notable events documented.  Last Vitals:  Vitals:   09/06/23 1545 09/06/23 1550  BP: (!) 179/77   Pulse: 71 66  Resp: 11 11  Temp:    SpO2: 100% 100%    Last Pain:  Vitals:   09/06/23 1545  TempSrc:   PainSc: 0-No pain                 Beyonca Wisz,E. Dao Mearns

## 2023-09-06 NOTE — H&P (Signed)
 GASTROENTEROLOGY PROCEDURE H&P NOTE   Primary Care Physician: Renato Dorothey HERO, NP  HPI: [name] is a 73 y.o. female who presents for EGD/EUS to evaluate pancreatic mass-lesion.  Past Medical History:  Diagnosis Date   Anxiety    Asthma    COPD (chronic obstructive pulmonary disease) (HCC)    Hypertension    IBS (irritable bowel syndrome)    IDA (iron  deficiency anemia)    Past Surgical History:  Procedure Laterality Date   ABDOMINAL HYSTERECTOMY     BREAST SURGERY     left lumpectomy   ESOPHAGEAL DILATION N/A 06/12/2023   Procedure: DILATION, ESOPHAGUS;  Surgeon: Cindie Carlin POUR, DO;  Location: [facility];  Service: Endoscopy;  Laterality: N/A;   ESOPHAGOGASTRODUODENOSCOPY N/A 06/12/2023   Procedure: EGD (ESOPHAGOGASTRODUODENOSCOPY);  Surgeon: Cindie Carlin POUR, DO;  Location: [facility];  Service: Endoscopy;  Laterality: N/A;  800am, asa 2   HERNIA REPAIR  2008   umbilical hernia repair   REPLACEMENT TOTAL KNEE Left 2023   TONSILLECTOMY     No current facility-administered medications for this encounter.   No current facility-administered medications for this encounter. Allergies  Allergen Reactions   Codeine Nausea And Vomiting   Egg-Derived Products    Family History  Problem Relation Age of Onset   Colon cancer Neg Hx    Pancreatic disease Neg Hx    Social History   Socioeconomic History   Marital status: Divorced    Spouse name: Not on file   Number of children: Not on file   Years of education: Not on file   Highest education level: Not on file  Occupational History   Not on file  Tobacco Use   Smoking status: Former    Types: Cigarettes    Start date: 03/31/2023    Quit date: 02/27/1969    Years since quitting: 54.5   Smokeless tobacco: Never  Vaping Use   Vaping status: Never Used  Substance and Sexual Activity   Alcohol use: No   Drug use: No   Sexual activity: Not on file  Other Topics Concern   Not on file  Social  History Narrative   Not on file   Social Drivers of Health   Financial Resource Strain: Low Risk  (07/18/2021)   Received from [facility]   Overall Financial Resource Strain (CARDIA)    Difficulty of Paying Living Expenses: Not very hard  Food Insecurity: No Food Insecurity (04/14/2023)   Hunger Vital Sign    Worried About Running Out of Food in the Last Year: Never true    Ran Out of Food in the Last Year: Never true  Transportation Needs: No Transportation Needs (04/14/2023)   PRAPARE - Administrator, Civil Service (Medical): No    Lack of Transportation (Non-Medical): No  Physical Activity: Not on file  Stress: No Stress Concern Present (07/18/2021)   Received from [facility] - Occupational Stress Questionnaire    Feeling of Stress : Only a little  Social Connections: Unknown (04/14/2023)   Social Connection and Isolation Panel    Frequency of Communication with Friends and Family: Once a week    Frequency of Social Gatherings with Friends and Family: Once a week    Attends Religious Services: Not on Marketing executive or Organizations: Yes    Attends Banker Meetings: Patient unable to answer    Marital Status:  Not on file  Intimate Partner Violence: Not At Risk (04/14/2023)   Humiliation, Afraid, Rape, and Kick questionnaire    Fear of Current or Ex-Partner: No    Emotionally Abused: No    Physically Abused: No    Sexually Abused: No    Physical Exam: Today's Vitals   08/29/23 1351 09/06/23 1109  BP:  (!) 160/48  Pulse:  63  Resp:  18  Temp:  97.9 F (36.6 C)  TempSrc:  Temporal  SpO2:  98%  Weight: 63 kg 63.5 kg  Height:  5' 1 (1.549 m)  PainSc:  5    Body mass index is 26.45 kg/m. GEN: NAD EYE: Sclerae anicteric ENT: MMM CV: Non-tachycardic GI: Soft, NT/ND NEURO:  Alert & Oriented x 3  Lab Results: No results for input(s): WBC, HGB, HCT, PLT in the last 72  hours. BMET No results for input(s): NA, K, CL, CO2, GLUCOSE, BUN, CREATININE, CALCIUM  in the last 72 hours. LFT No results for input(s): PROT, ALBUMIN, AST, ALT, ALKPHOS, BILITOT, BILIDIR, IBILI in the last 72 hours. PT/INR No results for input(s): LABPROT, INR in the last 72 hours.   Impression / Plan: This is a 73 y.o.female who presents for EGD/EUS to evaluate pancreatic mass-lesion.  The risks of an EUS including intestinal perforation, bleeding, infection, aspiration, and medication effects were discussed as was the possibility it may not give a definitive diagnosis if a biopsy is performed.  When a biopsy of the pancreas is done as part of the EUS, there is an additional risk of pancreatitis at the rate of about 1-2%.  It was explained that procedure related pancreatitis is typically mild, although it can be severe and even life threatening, which is why we do not perform random pancreatic biopsies and only biopsy a lesion/area we feel is concerning enough to warrant the risk.   The risks and benefits of endoscopic evaluation/treatment were discussed with the patient and/or family; these include but are not limited to the risk of perforation, infection, bleeding, missed lesions, lack of diagnosis, severe illness requiring hospitalization, as well as anesthesia and sedation related illnesses.  The patient's history has been reviewed, patient examined, no change in status, and deemed stable for procedure.  The patient and/or family is agreeable to proceed.    Patty Finner, MD [facility] Office # [phone]

## 2023-09-07 DIAGNOSIS — K2971 Gastritis, unspecified, with bleeding: Secondary | ICD-10-CM

## 2023-09-07 DIAGNOSIS — K8689 Other specified diseases of pancreas: Secondary | ICD-10-CM

## 2023-09-07 LAB — SURGICAL PATHOLOGY

## 2023-09-09 ENCOUNTER — Ambulatory Visit: Payer: Self-pay | Admitting: Gastroenterology

## 2023-09-10 LAB — CYTOLOGY - NON PAP

## 2023-09-12 ENCOUNTER — Other Ambulatory Visit: Payer: Self-pay

## 2023-09-12 DIAGNOSIS — C259 Malignant neoplasm of pancreas, unspecified: Secondary | ICD-10-CM

## 2023-09-12 DIAGNOSIS — K8689 Other specified diseases of pancreas: Secondary | ICD-10-CM

## 2023-09-12 NOTE — Progress Notes (Signed)
 Lab orders entered  CT chest has been entered and sent to the schedulers  Referral to [facility] with records faxed  Oncology referral made

## 2023-09-14 ENCOUNTER — Encounter: Payer: Self-pay | Admitting: Advanced Practice Midwife

## 2023-09-17 ENCOUNTER — Other Ambulatory Visit: Payer: Self-pay

## 2023-09-17 ENCOUNTER — Inpatient Hospital Stay: Attending: Oncology | Admitting: Oncology

## 2023-09-17 ENCOUNTER — Inpatient Hospital Stay

## 2023-09-17 VITALS — BP 151/63 | HR 65 | Temp 97.8°F | Resp 18 | Ht 60.63 in | Wt 143.3 lb

## 2023-09-17 DIAGNOSIS — C259 Malignant neoplasm of pancreas, unspecified: Secondary | ICD-10-CM | POA: Insufficient documentation

## 2023-09-17 DIAGNOSIS — R109 Unspecified abdominal pain: Secondary | ICD-10-CM | POA: Insufficient documentation

## 2023-09-17 DIAGNOSIS — R112 Nausea with vomiting, unspecified: Secondary | ICD-10-CM | POA: Insufficient documentation

## 2023-09-17 DIAGNOSIS — C251 Malignant neoplasm of body of pancreas: Secondary | ICD-10-CM | POA: Insufficient documentation

## 2023-09-17 LAB — CBC WITH DIFFERENTIAL/PLATELET
Abs Immature Granulocytes: 0.01 K/uL (ref 0.00–0.07)
Basophils Absolute: 0.1 K/uL (ref 0.0–0.1)
Basophils Relative: 1 %
Eosinophils Absolute: 0.2 K/uL (ref 0.0–0.5)
Eosinophils Relative: 3 %
HCT: 35.4 % — ABNORMAL LOW (ref 36.0–46.0)
Hemoglobin: 11.1 g/dL — ABNORMAL LOW (ref 12.0–15.0)
Immature Granulocytes: 0 %
Lymphocytes Relative: 37 %
Lymphs Abs: 1.8 K/uL (ref 0.7–4.0)
MCH: 27.4 pg (ref 26.0–34.0)
MCHC: 31.4 g/dL (ref 30.0–36.0)
MCV: 87.4 fL (ref 80.0–100.0)
Monocytes Absolute: 0.4 K/uL (ref 0.1–1.0)
Monocytes Relative: 8 %
Neutro Abs: 2.5 K/uL (ref 1.7–7.7)
Neutrophils Relative %: 51 %
Platelets: 196 K/uL (ref 150–400)
RBC: 4.05 MIL/uL (ref 3.87–5.11)
RDW: 13.9 % (ref 11.5–15.5)
WBC: 5 K/uL (ref 4.0–10.5)
nRBC: 0 % (ref 0.0–0.2)

## 2023-09-17 LAB — COMPREHENSIVE METABOLIC PANEL WITH GFR
ALT: 16 U/L (ref 0–44)
AST: 14 U/L — ABNORMAL LOW (ref 15–41)
Albumin: 3.9 g/dL (ref 3.5–5.0)
Alkaline Phosphatase: 67 U/L (ref 38–126)
Anion gap: 9 (ref 5–15)
BUN: 14 mg/dL (ref 8–23)
CO2: 29 mmol/L (ref 22–32)
Calcium: 8.7 mg/dL — ABNORMAL LOW (ref 8.9–10.3)
Chloride: 99 mmol/L (ref 98–111)
Creatinine, Ser: 0.75 mg/dL (ref 0.44–1.00)
GFR, Estimated: >60 mL/min (ref >60)
Glucose, Bld: 143 mg/dL — ABNORMAL HIGH (ref 70–99)
Potassium: 3.8 mmol/L (ref 3.5–5.1)
Sodium: 137 mmol/L (ref 135–145)
Total Bilirubin: 0.5 mg/dL (ref 0.0–1.2)
Total Protein: 6.7 g/dL (ref 6.5–8.1)

## 2023-09-17 MED ORDER — ONDANSETRON HCL 8 MG PO TABS: 8.0000 mg | ORAL_TABLET | Freq: Three times a day (TID) | ORAL | 1 refills | Status: DC | PRN

## 2023-09-17 MED ORDER — OXYCODONE-ACETAMINOPHEN 5-325 MG PO TABS: 1.0000 | ORAL_TABLET | Freq: Three times a day (TID) | ORAL | 0 refills | Status: DC | PRN

## 2023-09-17 NOTE — Progress Notes (Signed)
 REFERRING PHYSICIAN:  Aloha Wilhelmenia Raddle.* PROVIDER:  LEONOR MACARIO DAWN, MD MRN: [id number] DOB: 02/02/1951 DATE OF ENCOUNTER: 09/19/2023 Subjective    Chief Complaint: New Consultation (Pancreatic cancer)   History of Present Illness: [name] is a 73 y.o. female who is seen today as an office consultation for evaluation of New Consultation (Pancreatic cancer)  She has been having abdominal pain and constipation for several months. She previously had a CT scan in February of this year which showed mild edema of the pancreatic tail. Lipase was 71. Her symptoms have persisted and she had a follow up CT scan on 6/11, which showed a new 2.1cm mass in the body of the pancreas, with upstream ductal dilation. She had an EUS on 7/10, which showed a 14mm hypoechoic mass in the body of the pancreas, with no invasion into the SMA, celiac trunk, or portosplenic confluence. This was staged as a uT1N0, and FNA confirmed adenocarcinoma. Staging CT chest is pending, but there was no evidence of metastatic disease within the abdomen. CA19-9 was normal at 23.  Previous abdominal surgeries include an umbilical hernia repair and hysterectomy. She has COPD and uses inhalers but is not on oxygen. She quit smoking several months ago. She functions independently and lives with her grandson.    Review of Systems: A complete review of systems was obtained from the patient.  I have reviewed this information and discussed as appropriate with the patient.  See HPI as well for other ROS.   Medical History: Past Medical History:  Diagnosis Date  . Anxiety   . COPD (chronic obstructive pulmonary disease) (CMS/HHS-HCC)   . GERD (gastroesophageal reflux disease)   . History of cancer   . Hypertension     There is no problem list on file for this patient.   History reviewed. No pertinent surgical history.   Allergies  Allergen Reactions  . Codeine Nausea And Vomiting    Other reaction(s): Unknown   . Egg Derived Other (See Comments)    Current Outpatient Medications on File Prior to Visit  Medication Sig Dispense Refill  . albuterol  MDI, PROVENTIL , VENTOLIN , PROAIR , HFA 90 mcg/actuation inhaler Inhale 2 inhalations into the lungs every 6 (six) hours as needed for Wheezing    . ALPRAZolam  (XANAX ) 0.5 MG tablet Take 0.5 mg by mouth 2 (two) times daily    . BREZTRI AEROSPHERE 160-9-4.8 mcg/actuation inhaler Inhale 2 inhalations into the lungs 2 (two) times daily    . escitalopram  oxalate (LEXAPRO ) 10 MG tablet Take 10 mg by mouth once daily    . lisinopriL -hydroCHLOROthiazide  (ZESTORETIC ) 20-12.5 mg tablet Take 1 tablet by mouth once daily    . nitrofurantoin  (MACRODANTIN ) 50 MG capsule Take 50 mg by mouth at bedtime    . omeprazole  (PRILOSEC) 40 MG DR capsule Take 40 mg by mouth    . oxyCODONE -acetaminophen  (PERCOCET) 5-325 mg tablet Take 1 tablet by mouth     No current facility-administered medications on file prior to visit.    Family History  Problem Relation Age of Onset  . Breast cancer Mother   . Skin cancer Father   . High blood pressure (Hypertension) Father   . Skin cancer Brother   . High blood pressure (Hypertension) Brother      Social History   Tobacco Use  Smoking Status Never  Smokeless Tobacco Never     Social History   Socioeconomic History  . Marital status: Unknown  Tobacco Use  . Smoking status: Never  .  Smokeless tobacco: Never  Vaping Use  . Vaping status: Never Used  Substance and Sexual Activity  . Alcohol use: Never  . Drug use: Never   Social Drivers of Corporate investment banker Strain: Low Risk  (07/18/2021)   Received from [facility]   Overall Financial Resource Strain (CARDIA)   . Difficulty of Paying Living Expenses: Not very hard  Food Insecurity: No Food Insecurity (09/17/2023)   Received from [facility]   Hunger Vital Sign   . Within the past 12 months, you worried that your food would run out before you got the money  to buy more.: Never true   . Within the past 12 months, the food you bought just didn't last and you didn't have money to get more.: Never true  Transportation Needs: No Transportation Needs (09/17/2023)   Received from [facility] - Transportation   . In the past 12 months, has lack of transportation kept you from medical appointments or from getting medications?: No   . In the past 12 months, has lack of transportation kept you from meetings, work, or from getting things needed for daily living?: No  Stress: No Stress Concern Present (07/18/2021)   Received from [facility] - Occupational Stress Questionnaire   . Feeling of Stress : Only a little  Social Connections: Unknown (04/14/2023)   Received from [facility]   Social Connection and Isolation Panel   . In a typical week, how many times do you talk on the phone with family, friends, or neighbors?: Once a week   . How often do you get together with friends or relatives?: Once a week   . Do you belong to any clubs or organizations such as church groups, unions, fraternal or athletic groups, or school groups?: Yes   . How often do you attend meetings of the clubs or organizations you belong to?: Patient unable to answer  Housing Stability: Unknown (09/19/2023)   Housing Stability Vital Sign   . Homeless in the Last Year: No    Objective:   Vitals:   09/19/23 1030  BP: (!) 193/80  Pulse: 91  Temp: 36.6 C (97.9 F)  SpO2: 97%  Weight: 65 kg (143 lb 6.4 oz)  Height: 152.4 cm (5')  PainSc:   8  PainLoc: Abdomen    Body mass index is 28.01 kg/m.  Physical Exam Constitutional:      General: She is not in acute distress.    Appearance: Normal appearance.  Eyes:     General: No scleral icterus.    Conjunctiva/sclera: Conjunctivae normal.  Pulmonary:     Effort: Pulmonary effort is normal. No respiratory distress.     Breath sounds: Normal breath sounds. No wheezing.   Abdominal:     General: There is no distension.     Palpations: Abdomen is soft.     Tenderness: There is no abdominal tenderness.  Neurological:     General: No focal deficit present.     Mental Status: She is alert and oriented to person, place, and time.        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 I personally reviewed her labs, imaging and referral notes. Assuming CT chest does not show any thoracic metastatic disease, this is resectable via a distal  pancreatectomy. Staging laparoscopy and open distal pancreatectomy with splenectomy was recommended. The details of this procedure were discussed, including the benefits and the risks of bleeding, infection, damage to surrounding structures, 20% risk of pancreatic fistula, and risk of post-splenectomy sepsis. I have requested these vaccines be given at the [facility] is patient is an established patient there. All questions were answered and the patient agrees to proceed with surgery. She will be contact to schedule an elective surgery date.     SHELBY LYNN ALLEN, MD

## 2023-09-17 NOTE — Progress Notes (Unsigned)
 Hematology-Oncology Clinic Note  Renato Dorothey HERO, NP   Reason for Referral: Pancreatic adenocarcinoma  Oncology History: I have reviewed her chart and materials related to her cancer extensively and collaborated history with the patient. Summary of oncologic history is as follows:  Oncology History  Pancreatic adenocarcinoma (HCC)  08/08/2023 Imaging   CT abdomen and pelvis with and without contrast:  IMPRESSION: 1. 2.1 cm hypoenhancing mass within the mid body of the pancreas resulting in mild dilation of the main pancreatic duct within the tail the pancreas and asymmetric pancreatic parenchymal atrophy within this region. These findings are suspicious for a pancreatic adenocarcinoma. Endoscopic ultrasound and biopsy is advised for further evaluation. 2. No evidence regional adenopathy or distant metastatic disease within the abdomen and pelvis. 3. Small hiatal hernia. 4. Status post umbilical hernia repair with mesh.     09/06/2023 Pathology Results   A. PANCREATIC, BODY LESION, FINE NEEDLE ASPIRATION   FINAL MICROSCOPIC DIAGNOSIS:  - Adenocarcinoma    09/17/2023 Initial Diagnosis   Pancreatic adenocarcinoma (HCC)       History of Presenting Illness: [name] 73 y.o. female is referred by Dr. Wilhelmenia for pancreatic adenocarcinoma.  She is accompanied by her son today.  Patient was initially admitted to the hospital in February 2025 for acute hypoxic respiratory failure but CT imaging at that time showed some inflammation around the pancreas without fluid collection suggesting mild pancreatitis.  She then followed up with GI outpatient and April 2025 for abdominal pain and had an EGD for evaluation of peptic ulcer disease.  She continued to have abdominal pain with elevated lipase, then had a CT with pancreatic protocol showing a pancreatic mass.  She was then referred to Dr. Wilhelmenia for EUS with biopsy which was consistent with adenocarcinoma.  Today,  patient reports experiencing severe abdominal pain for the past 2 weeks, describing it as constant and significantly impairing her ability to walk and perform daily activities.  She reports having occasional nausea and vomiting in the morning but then gets better through the day.  Her appetite is stable and she did not lose any weight.  Socially, she reports to be quitting smoking in March and does not use alcohol.  She lives with her 17 year old grandson and is functionally very active.   Medical History: Past Medical History:  Diagnosis Date   Anxiety    Asthma    COPD (chronic obstructive pulmonary disease) (HCC)    Hypertension    IBS (irritable bowel syndrome)    IDA (iron  deficiency anemia)     Surgical history: Past Surgical History:  Procedure Laterality Date   ABDOMINAL HYSTERECTOMY     BREAST SURGERY     left lumpectomy   ESOPHAGEAL DILATION N/A 06/12/2023   Procedure: DILATION, ESOPHAGUS;  Surgeon: Cindie Carlin POUR, DO;  [facility];  Service: Endoscopy;  Laterality: N/A;   ESOPHAGOGASTRODUODENOSCOPY N/A 06/12/2023   Procedure: EGD (ESOPHAGOGASTRODUODENOSCOPY);  Surgeon: Cindie Carlin POUR, DO;  Location: [facility];  Service: Endoscopy;  Laterality: N/A;  800am, asa 2   ESOPHAGOGASTRODUODENOSCOPY N/A 09/06/2023   Procedure: EGD (ESOPHAGOGASTRODUODENOSCOPY);  Surgeon: Wilhelmenia Aloha Raddle., MD;  Location: [facility];  Service: Gastroenterology;  Laterality: N/A;   EUS N/A 09/06/2023   Procedure: ULTRASOUND, UPPER GI TRACT, ENDOSCOPIC;  Surgeon: Wilhelmenia Aloha Raddle., MD;  Location: [facility];  Service: Gastroenterology;  Laterality: N/A;   FINE NEEDLE ASPIRATION  09/06/2023   Procedure: FINE NEEDLE ASPIRATION;  Surgeon: Wilhelmenia Aloha Raddle., MD;  Location:  [facility];  Service: Gastroenterology;;   HERNIA REPAIR  2008   umbilical hernia repair   REPLACEMENT TOTAL KNEE Left 2023   TONSILLECTOMY       Allergies:  is allergic to codeine and  egg-derived products.  Medications:  Current Outpatient Medications  Medication Sig Dispense Refill   albuterol  (PROVENTIL  HFA;VENTOLIN  HFA) 108 (90 BASE) MCG/ACT inhaler Inhale 2 puffs into the lungs every 6 (six) hours as needed for wheezing or shortness of breath.     ALPRAZolam  (XANAX ) 0.5 MG tablet Take 0.5 mg by mouth 2 (two) times daily.     BREZTRI AEROSPHERE 160-9-4.8 MCG/ACT AERO Inhale 2 puffs into the lungs 2 (two) times daily.     cariprazine (VRAYLAR) 1.5 MG capsule 1 capsule.     escitalopram  (LEXAPRO ) 20 MG tablet Take 20 mg by mouth daily.     ferrous sulfate  325 (65 FE) MG tablet Take 325 mg by mouth every other day.     ketoconazole  (NIZORAL ) 2 % cream Apply to affected area (external use only) twice a day, continue at least 2-3 days after rash resolves 30 g 0   lisinopril -hydrochlorothiazide  (ZESTORETIC ) 20-12.5 MG tablet Take 1 tablet by mouth daily.     nitrofurantoin  (MACRODANTIN ) 50 MG capsule Take 1 capsule (50 mg total) by mouth at bedtime. 30 capsule 11   omeprazole  (PRILOSEC) 40 MG capsule Take 1 capsule (40 mg total) by mouth 2 (two) times daily before a meal. 60 capsule 6   ondansetron  (ZOFRAN ) 8 MG tablet Take 1 tablet (8 mg total) by mouth every 8 (eight) hours as needed for nausea or vomiting. 20 tablet 1   polyethylene glycol (MIRALAX ) 17 g packet Take 17 g by mouth daily. 30 each 11   oxyCODONE -acetaminophen  (PERCOCET/ROXICET) 5-325 MG tablet Take 1 tablet by mouth every 8 (eight) hours as needed for severe pain (pain score 7-10). 30 tablet 0   No current facility-administered medications for this visit.    Review of Systems: Constitutional: Denies fevers, chills or abnormal night sweats Eyes: Denies blurriness of vision, double vision or watery eyes Ears, nose, mouth, throat, and face: Denies mucositis or sore throat Respiratory: Denies cough, dyspnea or wheezes Cardiovascular: Denies palpitation, chest discomfort or lower extremity  swelling Gastrointestinal:  Denies nausea, heartburn or change in bowel habits Skin: Denies abnormal skin rashes Lymphatics: Denies new lymphadenopathy or easy bruising Neurological:Denies numbness, tingling or new weaknesses Behavioral/Psych: Mood is stable, no new changes  All other systems were reviewed with the patient and are negative.  Physical Examination: ECOG PERFORMANCE STATUS: 1 - Symptomatic but completely ambulatory  Vitals:   09/17/23 1100  BP: (!) 151/63  Pulse: 65  Resp: 18  Temp: 97.8 F (36.6 C)  SpO2: 100%   Filed Weights   09/17/23 1100  Weight: 143 lb 4.8 oz (65 kg)    GENERAL:alert, no distress and comfortable SKIN: skin color, texture, turgor are normal, no rashes or significant lesions EYES: No jaundice  LUNGS: clear to auscultation and percussion with normal breathing effort HEART: regular rate & rhythm and no murmurs and no lower extremity edema ABDOMEN: Tenderness in the right upper quadrant, no distention, normal bowel sounds Musculoskeletal:no cyanosis of digits and no clubbing  PSYCH: alert & oriented x 3 with fluent speech NEURO: no focal motor/sensory deficits   Laboratory Data: I have reviewed the data as listed Lab Results  Component Value Date   WBC 5.0 09/17/2023   HGB 11.1 (L) 09/17/2023   HCT 35.4 (L)  09/17/2023   MCV 87.4 09/17/2023   PLT 196 09/17/2023   Recent Labs    04/15/23 0601 04/16/23 0520 04/16/23 1726 04/17/23 0319 06/05/23 1157 09/17/23 1137  NA 121*   < > 126* 127* 140 137  K 4.2   < > 4.5 3.9 4.0 3.8  CL 86*   < > 89* 90* 99 99  CO2 27   < > 27 26 25 29   GLUCOSE 145*   < > 228* 148* 105* 143*  BUN 12   < > 12 15 7* 14  CREATININE 0.45   < > 0.67 0.49 0.67 0.75  CALCIUM  8.0*   < > 8.6* 8.6* 8.7 8.7*  GFRNONAA >60   < > >60 >60  --  >60  PROT 6.3*  --   --   --  6.2 6.7  ALBUMIN 3.7  --   --   --  4.3 3.9  AST 12*  --   --   --  8 14*  ALT 14  --   --   --  8 16  ALKPHOS 66  --   --   --  68 67   BILITOT 0.6  --   --   --  0.2 0.5   < > = values in this interval not displayed.    Radiographic Studies: I have personally reviewed the radiological images as listed and agreed with the findings in the report.  CT ABDOMEN W WO CONTRAST CLINICAL DATA:  Pancreatitis  EXAM: CT ABDOMEN WITHOUT AND WITH CONTRAST  TECHNIQUE: Multidetector CT imaging of the abdomen was performed following the standard protocol before and following the bolus administration of intravenous contrast.  RADIATION DOSE REDUCTION: This exam was performed according to the departmental dose-optimization program which includes automated exposure control, adjustment of the mA and/or kV according to patient size and/or use of iterative reconstruction technique.  CONTRAST:  80mL OMNIPAQUE  IOHEXOL  300 MG/ML  SOLN  COMPARISON:  04/14/2023  FINDINGS: Lower chest: No acute abnormality.  Small hiatal hernia  Hepatobiliary: No focal liver abnormality is seen. No gallstones, gallbladder wall thickening, or biliary dilatation.  Pancreas: Since the prior examination, there has developed mild dilation of the main pancreatic duct within the tail the pancreas with associated asymmetric pancreatic parenchymal atrophy within this region. The duct remains dilated to the point of the mid body of the pancreas where a hypoenhancing mass is identified best seen on image # 39/4 measuring 1.3 x 2.1 cm suspicious for a pancreatic adenocarcinoma. This mass marginally abuts the proximal splenic artery and terminal splenic vein, but appears separate from the celiac axis, superior mesenteric vein, and portal vein. No peripancreatic inflammatory change, fluid collection, or adenopathy. Normal enhancement of the head and uncinate process of the pancreas.  Spleen: Densely calcified lesion splenic hilum abutting the gastric fundus is in keeping remote trauma and/or inflammation. Spleen itself is unremarkable.  Adrenals/Urinary  Tract: Adrenal glands are unremarkable. Kidneys are normal, without renal calculi, focal lesion, or hydronephrosis.  Stomach/Bowel: Stomach, visualized small bowel, and visualized large bowel are unremarkable. No free intraperitoneal gas or fluid within the upper abdomen.  Vascular/Lymphatic: See above. The abdominal vasculature is otherwise unremarkable. No pathologic adenopathy within the abdomen and retroperitoneum.  Other: Umbilical hernia repair with mesh has been performed.  Musculoskeletal: Degenerative changes noted within visualized thoracolumbar spine. No acute bone abnormality. No lytic blastic bone lesion.  IMPRESSION: 1. 2.1 cm hypoenhancing mass within the mid body of the pancreas resulting in  mild dilation of the main pancreatic duct within the tail the pancreas and asymmetric pancreatic parenchymal atrophy within this region. These findings are suspicious for a pancreatic adenocarcinoma. Endoscopic ultrasound and biopsy is advised for further evaluation. 2. No evidence regional adenopathy or distant metastatic disease within the abdomen and pelvis. 3. Small hiatal hernia. 4. Status post umbilical hernia repair with mesh.  Electronically Signed   By: Dorethia Molt M.D.   On: 08/17/2023 03:23    ASSESSMENT & PLAN:  Patient is a 74 y.o. female presenting for pancreatic adenocarcinoma  Assessment & Plan Pancreatic adenocarcinoma [facility]) Patient was recently diagnosed with pancreatic adenocarcinoma with an FNA on EUS.  Likely early stage.  - Will refer to surgery for probable resection.  Appointment on 09/19/2023. - Discussed with the patient that treatment is a combination of surgery followed by chemotherapy with or without chemoradiation based on the pathology findings - Will obtain CT chest to rule out lung metastasis - Will obtain baseline labs today including a CA 19-9 - Will refer for genetic counseling  Return to clinic after seeing surgery to discuss  further management Nausea and vomiting, unspecified vomiting type Recent onset nausea requiring management.  Likely secondary to cancer  - Prescribed Zofran  every 8 hours as needed. Right sided abdominal pain Severe abdominal pain affecting daily activities.  - Prescribed Percocet 5 mg every 8 hours as needed.    Orders Placed This Encounter  Procedures   CT CHEST W CONTRAST    Standing Status:   Future    Expected Date:   09/24/2023    Expiration Date:   09/16/2024    If indicated for the ordered procedure, I authorize the administration of contrast media per Radiology protocol:   Yes    Does the patient have a contrast media/X-ray dye allergy?:   No    Preferred imaging location?:   External    Release to patient:   Immediate [1]    Release to patient:   Immediate   CBC with Differential/Platelet    Standing Status:   Future    Number of Occurrences:   1    Expected Date:   09/17/2023    Expiration Date:   12/16/2023   Comprehensive metabolic panel with GFR    Standing Status:   Future    Number of Occurrences:   1    Expected Date:   09/17/2023    Expiration Date:   12/16/2023   Cancer antigen 19-9    Standing Status:   Future    Number of Occurrences:   1    Expected Date:   09/17/2023    Expiration Date:   12/16/2023   Ambulatory referral to Social Work    Referral Priority:   Routine    Referral Type:   Consultation    Referral Reason:   Specialty Services Required    Number of Visits Requested:   1   Ambulatory referral to Genetics    Referral Priority:   Routine    Referral Type:   Consultation    Referral Reason:   Specialty Services Required    Number of Visits Requested:   1    The total time spent in the appointment was 60 minutes encounter with patients including review of chart and various tests results, discussions about plan of care and coordination of care plan   All questions were answered. The patient knows to call the clinic with any problems,  questions or concerns. No barriers  to learning was detected.  Mickiel Dry, MD 7/22/20252:27 PM

## 2023-09-17 NOTE — Patient Instructions (Addendum)
 [facility]  Discharge Instructions  You were seen and examined today by Dr. Davonna. Dr. Davonna is a medical oncologist, meaning that he specializes in the treatment of cancer diagnoses. Dr. Davonna discussed your past medical history, family history of cancers, and the events that led to you being here today.  You were referred to Dr. Davonna due to a mass found in your pancreas, which has been identified as pancreatic adenocarcinoma (pancreatic cancer). Based on the imaging and testing that we have so far, it appears to be Stage I. The ideal treatment is surgery followed by chemotherapy.  Dr. Davonna has recommended an appointment with [facility]. Dr. Wilhelmenia sent a referral over for you. Their phone number is [phone] if you need to reach out to them.  Dr. Davonna has also recommended additional labs and a CT scan of your chest.  For your pain, Dr. Davonna has prescribed pain medication to be taken every 8 hours as needed. For your nausea, please take zofran  every 8 hours as needed.  Follow-up as scheduled.  Thank you for choosing [facility] to provide your oncology and hematology care.   To afford each patient quality time with our provider, please arrive at least 15 minutes before your scheduled appointment time. You may need to reschedule your appointment if you arrive late (10 or more minutes). Arriving late affects you and other patients whose appointments are after yours.  Also, if you miss three or more appointments without notifying the office, you may be dismissed from the clinic at the provider's discretion.    Again, thank you for choosing [facility].  Our hope is that these requests will decrease the amount of time that you wait before being seen by our physicians.   If you have a lab appointment with the Cancer Center - please note that after April 8th, all labs will be drawn in the  cancer center.  You do not have to check in or register with the main entrance as you have in the past but will complete your check-in at the cancer center.            _____________________________________________________________  Should you have questions after your visit to [facility], please contact our office at [phone] and follow the prompts.  Our office hours are 8:00 a.m. to 4:30 p.m. Monday - Thursday and 8:00 a.m. to 2:30 p.m. Friday.  Please note that voicemails left after 4:00 p.m. may not be returned until the following business day.  We are closed weekends and all major holidays.  You do have access to a nurse 24-7, just call the main number to the clinic [phone] and do not press any options, hold on the line and a nurse will answer the phone.    For prescription refill requests, have your pharmacy contact our office and allow 72 hours.    Masks are no longer required in the cancer centers. If you would like for your care team to wear a mask while they are taking care of you, please let them know. You may have one support person who is at least 75 years old accompany you for your appointments.

## 2023-09-18 DIAGNOSIS — R112 Nausea with vomiting, unspecified: Secondary | ICD-10-CM | POA: Insufficient documentation

## 2023-09-18 LAB — CANCER ANTIGEN 19-9: CA 19-9: 23 U/mL (ref 0–35)

## 2023-09-18 NOTE — Assessment & Plan Note (Signed)
 Patient was recently diagnosed with pancreatic adenocarcinoma with an FNA on EUS.  Likely early stage.  - Will refer to surgery for probable resection.  Appointment on 09/19/2023. - Discussed with the patient that treatment is a combination of surgery followed by chemotherapy with or without chemoradiation based on the pathology findings - Will obtain CT chest to rule out lung metastasis - Will obtain baseline labs today including a CA 19-9 - Will refer for genetic counseling  Return to clinic after seeing surgery to discuss further management

## 2023-09-18 NOTE — Assessment & Plan Note (Addendum)
 Severe abdominal pain affecting daily activities.  - Prescribed Percocet 5 mg every 8 hours as needed.

## 2023-09-18 NOTE — Assessment & Plan Note (Signed)
 Recent onset nausea requiring management.  Likely secondary to cancer  - Prescribed Zofran  every 8 hours as needed.

## 2023-09-19 ENCOUNTER — Inpatient Hospital Stay: Admitting: Licensed Clinical Social Worker

## 2023-09-19 DIAGNOSIS — C259 Malignant neoplasm of pancreas, unspecified: Secondary | ICD-10-CM

## 2023-09-19 NOTE — Progress Notes (Signed)
 [facility] Clinical Social Work  Initial Assessment   [name] is a 73 y.o. year old female contacted by phone. Clinical Social Work was referred by medical provider for assessment of psychosocial needs.   SDOH (Social Determinants of Health) assessments performed: Yes   SDOH Screenings   Food Insecurity: No Food Insecurity (09/17/2023)  Housing: Low Risk  (09/17/2023)  Transportation Needs: No Transportation Needs (09/17/2023)  Utilities: Not At Risk (09/17/2023)  Depression (PHQ2-9): Low Risk  (09/17/2023)  Financial Resource Strain: Low Risk  (07/18/2021)   Received from [facility]  Social Connections: Unknown (04/14/2023)  Stress: No Stress Concern Present (07/18/2021)   Received from [facility]  Tobacco Use: Medium Risk (09/06/2023)     Distress Screen completed: No     No data to display            Family/Social Information:  Housing Arrangement: patient lives with her grandson  Family members/support persons in your life? Pt's youngest son resides close by and is available to provide the support pt needs while undergoing treatment. Transportation concerns: no  Employment: Retired .  Income source: Actor concerns: Yes, current concerns Type of concern: Utilities and Medical bills Food access concerns: no Religious or spiritual practice: Yes-[employer] Advanced directives: Not known Services Currently in place:  none  Coping/ Adjustment to diagnosis: Patient understands treatment plan and what happens next? yes Concerns about diagnosis and/or treatment: Overwhelmed by information, Afraid of cancer, How I will pay for the services I need, and Quality of life Patient reported stressors: Finances, Anxiety/ nervousness, and Adjusting to my illness Hopes and/or priorities: pt's priority is to start treatment w/ the hope of positive results Patient enjoys time with family/ friends Current coping skills/ strengths: Capable of  independent living , Motivation for treatment/growth , Physical Health , and Supportive family/friends     SUMMARY: Current SDOH Barriers:  Financial constraints related to fixed income  Clinical Social Work Clinical Goal(s):  Explore community resource options for unmet needs related to:  Financial Strain   Interventions: Discussed common feeling and emotions when being diagnosed with cancer, and the importance of support during treatment Informed patient of the support team roles and support services at [facility] Provided CSW contact information and encouraged patient to call with any questions or concerns Referred patient to community resources: Wadie Rung.  Spoke w/ pt regarding the criteria for the [employer] once chemotherapy begins.      Follow Up Plan: Patient will contact CSW with any support or resource needs Patient verbalizes understanding of plan: Yes    Devere JONELLE Manna, LCSW Clinical Social Worker [facility]

## 2023-09-20 ENCOUNTER — Ambulatory Visit: Payer: Self-pay | Admitting: Surgery

## 2023-09-20 DIAGNOSIS — C259 Malignant neoplasm of pancreas, unspecified: Secondary | ICD-10-CM

## 2023-09-22 ENCOUNTER — Telehealth: Payer: Self-pay | Admitting: Gastroenterology

## 2023-09-22 NOTE — Telephone Encounter (Signed)
 Please let patient know that we will cancel her colonoscopy in light of pending surgery 8/25 for pancreatic cancer. Once she has fully recovered from surgery, we can follow up with her and determine if colonoscopy still needed. Please have her reach out for an office visit after she has recovered from surgery/pancreatic cancer.  FYI Dr. Cindie.

## 2023-09-23 ENCOUNTER — Ambulatory Visit (HOSPITAL_BASED_OUTPATIENT_CLINIC_OR_DEPARTMENT_OTHER)
Admission: RE | Admit: 2023-09-23 | Discharge: 2023-09-23 | Disposition: A | Discharge status: Home / Self Care | Source: Ambulatory Visit | Attending: Oncology | Admitting: Oncology

## 2023-09-23 ENCOUNTER — Emergency Department (HOSPITAL_BASED_OUTPATIENT_CLINIC_OR_DEPARTMENT_OTHER): Admission: EM | Admit: 2023-09-23 | Discharge: 2023-09-23 | Disposition: A | Discharge status: Home / Self Care

## 2023-09-23 ENCOUNTER — Other Ambulatory Visit: Payer: Self-pay

## 2023-09-23 DIAGNOSIS — Z23 Encounter for immunization: Secondary | ICD-10-CM | POA: Diagnosis not present

## 2023-09-23 DIAGNOSIS — Z79899 Other long term (current) drug therapy: Secondary | ICD-10-CM | POA: Insufficient documentation

## 2023-09-23 DIAGNOSIS — S40812A Abrasion of left upper arm, initial encounter: Secondary | ICD-10-CM

## 2023-09-23 DIAGNOSIS — S51012A Laceration without foreign body of left elbow, initial encounter: Secondary | ICD-10-CM | POA: Diagnosis not present

## 2023-09-23 DIAGNOSIS — W1839XA Other fall on same level, initial encounter: Secondary | ICD-10-CM | POA: Diagnosis not present

## 2023-09-23 DIAGNOSIS — Y92481 Parking lot as the place of occurrence of the external cause: Secondary | ICD-10-CM | POA: Diagnosis not present

## 2023-09-23 DIAGNOSIS — S59902A Unspecified injury of left elbow, initial encounter: Secondary | ICD-10-CM | POA: Diagnosis present

## 2023-09-23 DIAGNOSIS — T148XXA Other injury of unspecified body region, initial encounter: Secondary | ICD-10-CM

## 2023-09-23 DIAGNOSIS — C259 Malignant neoplasm of pancreas, unspecified: Secondary | ICD-10-CM

## 2023-09-23 MED ORDER — TETANUS-DIPHTH-ACELL PERTUSSIS 5-2.5-18.5 LF-MCG/0.5 IM SUSY
0.5000 mL | PREFILLED_SYRINGE | Freq: Once | INTRAMUSCULAR | Status: AC
  Administered 2023-09-23: 0.5 mL via INTRAMUSCULAR
  Filled 2023-09-23: qty 0.5

## 2023-09-23 MED ORDER — IOHEXOL 300 MG/ML  SOLN
75.0000 mL | Freq: Once | INTRAMUSCULAR | Status: AC | PRN
  Administered 2023-09-23: 75 mL via INTRAVENOUS

## 2023-09-23 NOTE — ED Provider Notes (Signed)
 [facility] EMERGENC[facility] DEPARTMENT AT [facility] Provider Note   CSN: [id number] Arrival date & time: 09/23/23  9052     Patient presents with: Arm Injury   [name] is a 73 y.o. female.    Arm Injury Associated symptoms: no back pain and no fever     Presents because of fall.  Patient states that she fell on the way to imaging today at the parking lot.  Patient states that she fell with extended arms.  Subsequently scraped her left elbow.  Did not hit her head.  Did not lose consciousness.  No neck or thoracic or lower back pain.  No anticoagulation use.  Denies all complaints.  Only complains about abrasion to the left elbow region.  Unclear about last tetanus shot.      Prior to Admission medications   Medication Sig Start Date End Date Taking? Authorizing Provider  albuterol  (PROVENTIL  HFA;VENTOLIN  HFA) 108 (90 BASE) MCG/ACT inhaler Inhale 2 puffs into the lungs every 6 (six) hours as needed for wheezing or shortness of breath.    [provider]  ALPRAZolam  (XANAX ) 0.5 MG tablet Take 0.5 mg by mouth 2 (two) times daily.    [provider]  BREZTRI AEROSPHERE 160-9-4.8 MCG/ACT AERO Inhale 2 puffs into the lungs 2 (two) times daily. 03/08/21   [provider]  cariprazine (VRAYLAR) 1.5 MG capsule 1 capsule. 09/11/23   [provider]  escitalopram  (LEXAPRO ) 20 MG tablet Take 20 mg by mouth daily. 12/29/20   [provider]  ferrous sulfate  325 (65 FE) MG tablet Take 325 mg by mouth every other day.    [provider]  ketoconazole  (NIZORAL ) 2 % cream Apply to affected area (external use only) twice a day, continue at least 2-3 days after rash resolves 08/07/23   Ezzard Sonny RAMAN, PA-C  lisinopril -hydrochlorothiazide  (ZESTORETIC ) 20-12.5 MG tablet Take 1 tablet by mouth daily.    [provider]  nitrofurantoin  (MACRODANTIN ) 50 MG capsule Take 1 capsule (50 mg total) by mouth at bedtime. 07/25/23   McKenzie,  Belvie CROME, MD  omeprazole  (PRILOSEC) 40 MG capsule Take 1 capsule (40 mg total) by mouth 2 (two) times daily before a meal. 09/06/23   Mansouraty, Aloha Raddle., MD  ondansetron  (ZOFRAN ) 8 MG tablet Take 1 tablet (8 mg total) by mouth every 8 (eight) hours as needed for nausea or vomiting. 09/17/23   Davonna Siad, MD  oxyCODONE -acetaminophen  (PERCOCET/ROXICET) 5-325 MG tablet Take 1 tablet by mouth every 8 (eight) hours as needed for severe pain (pain score 7-10). 09/17/23   Kandala, Hyndavi, MD  polyethylene glycol (MIRALAX ) 17 g packet Take 17 g by mouth daily. 07/25/23   Sherrilee Belvie CROME, MD    Allergies: Codeine and Egg-derived products    Review of Systems  Constitutional:  Negative for chills and fever.  HENT:  Negative for ear pain and sore throat.   Eyes:  Negative for pain and visual disturbance.  Respiratory:  Negative for cough and shortness of breath.   Cardiovascular:  Negative for chest pain and palpitations.  Gastrointestinal:  Negative for abdominal pain and vomiting.  Genitourinary:  Negative for dysuria and hematuria.  Musculoskeletal:  Negative for arthralgias and back pain.  Skin:  Negative for color change and rash.  Neurological:  Negative for seizures and syncope.  All other systems reviewed and are negative.   Updated Vital Signs BP (!) 160/60 (BP Location: Right Arm)   Pulse 70   Temp 98.2 F (  36.8 C) (Oral)   Resp 18   SpO2 100%   Physical Exam Vitals and nursing note reviewed.  Constitutional:      General: She is not in acute distress.    Appearance: She is well-developed.  HENT:     Head: Normocephalic and atraumatic.  Eyes:     Conjunctiva/sclera: Conjunctivae normal.  Cardiovascular:     Rate and Rhythm: Normal rate and regular rhythm.     Heart sounds: No murmur heard. Pulmonary:     Effort: Pulmonary effort is normal. No respiratory distress.     Breath sounds: Normal breath sounds.  Abdominal:     Palpations: Abdomen is soft.      Tenderness: There is no abdominal tenderness.  Musculoskeletal:        General: No swelling.       Arms:     Cervical back: Neck supple.  Skin:    General: Skin is warm and dry.     Capillary Refill: Capillary refill takes less than 2 seconds.  Neurological:     Mental Status: She is alert.  Psychiatric:        Mood and Affect: Mood normal.     (all labs ordered are listed, but only abnormal results are displayed) Labs Reviewed - No data to display  EKG: None  Radiology: CT CHEST W CONTRAST Result Date: 09/23/2023 CLINICAL DATA:  Pancreatic cancer, staging.  * Tracking Code: BO * EXAM: CT CHEST WITH CONTRAST TECHNIQUE: Multidetector CT imaging of the chest was performed during intravenous contrast administration. RADIATION DOSE REDUCTION: This exam was performed according to the departmental dose-optimization program which includes automated exposure control, adjustment of the mA and/or kV according to patient size and/or use of iterative reconstruction technique. CONTRAST:  75mL OMNIPAQUE  IOHEXOL  300 MG/ML  SOLN COMPARISON:  CT abdomen pelvis August 08, 2023 FINDINGS: Cardiovascular: Aortic atherosclerosis. Normal size heart. No significant pericardial effusion/thickening. Mediastinum/Nodes: No pathologically enlarged mediastinal, hilar or axillary lymph nodes. The esophagus is grossly unremarkable. Lungs/Pleura: Scattered tiny pulmonary nodules for instance a 2 mm nodule in the right upper lobe on image 39/3. Scattered atelectasis/scarring. No pleural effusion. No pneumothorax. Upper Abdomen: Known pancreatic lesion better assessed on CT pancreas protocol of the abdomen August 08, 2023. unchanged calcified nodule of the gastric body. Musculoskeletal: No aggressive lytic or blastic lesion of bone. IMPRESSION: 1. Scattered tiny pulmonary nodules measuring up to 2 mm, nonspecific but favored benign. Consider short-term interval follow-up dedicated chest CT. 2. No convincing evidence of metastatic  disease in the chest. 3. Known pancreatic lesion better assessed on CT pancreas protocol of the abdomen August 08, 2023. 4. Aortic atherosclerosis. Aortic Atherosclerosis (ICD10-I70.0). Electronically Signed   By: Reyes Holder M.D.   On: 09/23/2023 10:23     .Laceration Repair  Date/Time: 09/23/2023 10:49 AM  Performed by: Simon Lavonia SAILOR, MD Authorized by: Simon Lavonia SAILOR, MD   Consent:    Consent obtained:  Verbal   Consent given by:  Patient   Risks discussed:  Infection, pain, retained foreign body, vascular damage, tendon damage, poor wound healing, poor cosmetic result, need for additional repair and nerve damage   Alternatives discussed:  No treatment Universal protocol:    Patient identity confirmed:  Verbally with patient Anesthesia:    Anesthesia method:  None Laceration details:    Location:  Shoulder/arm   Shoulder/arm location:  L elbow   Length (cm):  4 Pre-procedure details:    Preparation:  Patient was prepped and draped in  usual sterile fashion Exploration:    Hemostasis achieved with:  Direct pressure   Wound exploration: wound explored through full range of motion and entire depth of wound visualized   Treatment:    Area cleansed with:  Povidone-iodine and saline   Amount of cleaning:  Standard   Irrigation solution:  Sterile saline   Irrigation volume:  1000   Irrigation method:  Pressure wash Skin repair:    Repair method:  Steri-Strips and tissue adhesive   Number of Steri-Strips:  3 Approximation:    Approximation:  Close Repair type:    Repair type:  Simple Post-procedure details:    Dressing:  Non-adherent dressing   Procedure completion:  Tolerated .Laceration Repair  Date/Time: 09/23/2023 10:50 AM  Performed by: Simon Lavonia SAILOR, MD Authorized by: Simon Lavonia SAILOR, MD   Consent:    Consent obtained:  Verbal   Consent given by:  Patient   Risks discussed:  Infection, pain, retained foreign body, tendon damage, vascular damage, poor wound healing,  need for additional repair, nerve damage and poor cosmetic result   Alternatives discussed:  No treatment Universal protocol:    Patient identity confirmed:  Verbally with patient Anesthesia:    Anesthesia method:  None Laceration details:    Location:  Shoulder/arm   Shoulder/arm location:  L elbow   Length (cm):  3 Pre-procedure details:    Preparation:  Patient was prepped and draped in usual sterile fashion Exploration:    Hemostasis achieved with:  Direct pressure Treatment:    Area cleansed with:  Povidone-iodine   Amount of cleaning:  Standard   Irrigation solution:  Sterile saline   Irrigation volume:  1000   Irrigation method:  Pressure wash Skin repair:    Repair method:  Steri-Strips and tissue adhesive   Number of Steri-Strips:  3 Approximation:    Approximation:  Close Repair type:    Repair type:  Simple Post-procedure details:    Dressing:  Non-adherent dressing    Medications Ordered in the ED  Tdap (BOOSTRIX ) injection 0.5 mL (0.5 mLs Intramuscular Given 09/23/23 1023)                                    Medical Decision Making Risk Prescription drug management.    Presents because of fall.  Patient states that she fell on the way to imaging today at the parking lot.  Patient states that she fell with extended arms.  Subsequently scraped her left elbow.  Did not hit her head.  Did not lose consciousness.  No neck or thoracic or lower back pain.  No anticoagulation use.  Denies all complaints.  Only complains about abrasion to the left elbow region.  Unclear about last tetanus shot.   Upon exam, patient no acute distress.  Hemodynamically stable ANO x 3 GCS 15.   No signs of trauma to the head.  No signs of trauma to the neck.  No anticoagulation use.  No pain to palpation of the cervical thoracic or lumbar pain area.  No indication for any kind of CT imaging.   Full range of motion of both the left shoulder as well as left elbow.  Left wrist shows  no pain to palpation.  Negative snuffbox tenderness.  No indication for imaging at this point time.  All wounds superficial.  Abrasions.  Skin tears.  Will not be able to suture but will clean.  She had 2 larger wounds.  Approximately 3 cm and another  4 cm.  No concerns for any kind of underlying fracture.  No indication for imaging.   Patient was cleaned aggressively.  Subsequently Steri-Strip as well as tissue adhesive as above.  Tdap updated.         Final diagnoses:  Abrasion of left upper extremity, initial encounter  Skin wound closed with sterile strip    ED Discharge Orders     None          Simon Lavonia SAILOR, MD 09/23/23 1053

## 2023-09-23 NOTE — Discharge Instructions (Addendum)
 You can wash the area but do not put any kind of ointment on this area.  Do not wash it with a washcloth.  You can dab around it and pat around it but do not pull off the strips.  If you notice any kind of redness or discharge from the area then please come into the ED for further evaluation.   The Steri-Strips and glue will fall off by themselves.

## 2023-09-23 NOTE — ED Triage Notes (Signed)
 Fell walking in facility to get outpatient CT. C/o left arm pain and skin tear. Bleeding controlled.

## 2023-09-24 NOTE — Telephone Encounter (Signed)
 Spoke with pt. She is aware and will call once she recovers. Message to endo to cancel

## 2023-10-01 ENCOUNTER — Inpatient Hospital Stay

## 2023-10-01 ENCOUNTER — Inpatient Hospital Stay: Attending: Oncology | Admitting: Oncology

## 2023-10-01 VITALS — BP 155/67 | HR 72 | Temp 98.2°F | Resp 19

## 2023-10-01 DIAGNOSIS — D649 Anemia, unspecified: Secondary | ICD-10-CM | POA: Insufficient documentation

## 2023-10-01 DIAGNOSIS — Z23 Encounter for immunization: Secondary | ICD-10-CM | POA: Diagnosis not present

## 2023-10-01 DIAGNOSIS — Z8042 Family history of malignant neoplasm of prostate: Secondary | ICD-10-CM | POA: Diagnosis not present

## 2023-10-01 DIAGNOSIS — Z808 Family history of malignant neoplasm of other organs or systems: Secondary | ICD-10-CM | POA: Diagnosis not present

## 2023-10-01 DIAGNOSIS — Z9081 Acquired absence of spleen: Secondary | ICD-10-CM | POA: Insufficient documentation

## 2023-10-01 DIAGNOSIS — K5903 Drug induced constipation: Secondary | ICD-10-CM | POA: Diagnosis not present

## 2023-10-01 DIAGNOSIS — C251 Malignant neoplasm of body of pancreas: Secondary | ICD-10-CM | POA: Insufficient documentation

## 2023-10-01 DIAGNOSIS — I700 Atherosclerosis of aorta: Secondary | ICD-10-CM | POA: Diagnosis not present

## 2023-10-01 DIAGNOSIS — C259 Malignant neoplasm of pancreas, unspecified: Secondary | ICD-10-CM | POA: Diagnosis not present

## 2023-10-01 DIAGNOSIS — R918 Other nonspecific abnormal finding of lung field: Secondary | ICD-10-CM | POA: Insufficient documentation

## 2023-10-01 DIAGNOSIS — Z79899 Other long term (current) drug therapy: Secondary | ICD-10-CM | POA: Insufficient documentation

## 2023-10-01 DIAGNOSIS — R109 Unspecified abdominal pain: Secondary | ICD-10-CM | POA: Insufficient documentation

## 2023-10-01 DIAGNOSIS — M545 Low back pain, unspecified: Secondary | ICD-10-CM | POA: Diagnosis not present

## 2023-10-01 DIAGNOSIS — R112 Nausea with vomiting, unspecified: Secondary | ICD-10-CM | POA: Insufficient documentation

## 2023-10-01 DIAGNOSIS — K5900 Constipation, unspecified: Secondary | ICD-10-CM | POA: Insufficient documentation

## 2023-10-01 MED ORDER — MENINGOCOCCAL A C Y&W-135 OLIG IM SOLN
0.5000 mL | Freq: Once | INTRAMUSCULAR | Status: AC
  Administered 2023-10-01: 0.5 mL via INTRAMUSCULAR
  Filled 2023-10-01: qty 0.5

## 2023-10-01 MED ORDER — DOCUSATE SODIUM 100 MG PO CAPS: 100.0000 mg | ORAL_CAPSULE | Freq: Two times a day (BID) | ORAL | 0 refills | Status: DC

## 2023-10-01 MED ORDER — HAEMOPHILUS B POLYSAC CONJ VAC IM SOLR
0.5000 mL | Freq: Once | INTRAMUSCULAR | Status: AC
  Administered 2023-10-01: 0.5 mL via INTRAMUSCULAR
  Filled 2023-10-01: qty 0.5

## 2023-10-01 MED ORDER — MENINGOCOCCAL VAC B (OMV) IM SUSY
0.5000 mL | PREFILLED_SYRINGE | Freq: Once | INTRAMUSCULAR | Status: AC
  Administered 2023-10-01: 0.5 mL via INTRAMUSCULAR
  Filled 2023-10-01: qty 0.5

## 2023-10-01 MED ORDER — PNEUMOCOCCAL 20-VAL CONJ VACC 0.5 ML IM SUSY
0.5000 mL | PREFILLED_SYRINGE | INTRAMUSCULAR | Status: AC
  Administered 2023-10-01: 0.5 mL via INTRAMUSCULAR
  Filled 2023-10-01: qty 0.5

## 2023-10-01 MED ORDER — PROCHLORPERAZINE MALEATE 10 MG PO TABS
10.0000 mg | ORAL_TABLET | Freq: Four times a day (QID) | ORAL | 0 refills | Status: DC | PRN
Start: 2023-10-01 — End: 2023-12-20

## 2023-10-01 NOTE — Assessment & Plan Note (Addendum)
 Patient was recently diagnosed with pancreatic adenocarcinoma with an FNA on EUS.  Likely early stage. Seen by Dr. Dasie and scheduled for pancreatectomy and splenectomy on 10/22/2023 CT chest with no evidence of metastasis Genetic counseling scheduled for 10/11/2023 CA 19-9 normal  - Discussed with the patient that she most likely will require chemotherapy after surgery with or without chemoradiation. -CT chest with very small lung nodules.  Will repeat CT chest in 6 to 12 months. - Patient is scheduled for pancreatectomy and splenectomy on 10/22/2023  Return to clinic 1 week after surgery to discuss further management

## 2023-10-01 NOTE — Patient Instructions (Signed)
 VISIT SUMMARY:  Today, you were seen for your pancreatic carcinoma and vaccination prior to your upcoming surgery. We discussed your lower back pain, constipation, nausea, and recent ear infection. We also reviewed your history of anemia and your current medications.  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 To reduce this risk, you will receive vaccinations today. Your surgery is scheduled for October 22, 2023, and we will communicate with Dr. Dasie regarding your vaccination administration.  -OPIOID-INDUCED CONSTIPATION: Constipation caused by opioid pain medication can lead to nausea and vomiting. We will prescribe a stool softener to help manage this. If the stool softener is too expensive, you can continue using over-the-counter Miralax , which should be taken with an 8-ounce glass of water.  -PAIN MANAGEMENT, PRIMARILY LOWER BACK PAIN: Your significant lower back pain is being managed with your current pain medication. Continue taking your pain medication as prescribed, especially at night.  -NAUSEA AND VOMITING: Your nausea and vomiting are likely due to infrequent bowel movements. We will prescribe an alternative medication for nausea and vomiting. Using Miralax  to aid bowel movements should also help reduce these symptoms.  INSTRUCTIONS:  Please follow up with Dr. Dasie for  your planned surgery on October 22, 2023. Continue managing your constipation with the prescribed stool softener or Miralax , and take your pain medication as directed. If you experience any new or worsening symptoms, please contact our office.

## 2023-10-01 NOTE — Progress Notes (Signed)
 [name] presents today for injection per the provider's orders.  ActHIB , BEXSERO , MENVEO, and PREVNAR 20   administration without incident; injection site WNL; see MAR for injection details.  Patient tolerated procedure well and without incident.  No questions or complaints noted at this time. Discharged from clinic ambulatory in stable condition. Alert and oriented x 3. F/U with [facility] as scheduled.

## 2023-10-01 NOTE — Assessment & Plan Note (Signed)
 Since the patient is planned to undergo splenectomy, will administer the following vaccines today Pneumococcal vaccine Prevnar 20 , Hib vaccine and  2 meningococcal vaccines  - Will administer PPSVV 23 8 weeks after and every 5 years after that - Meningococcal vaccine booster every 5 years - Yearly influenza vaccine

## 2023-10-01 NOTE — Assessment & Plan Note (Addendum)
 Patient has normocytic anemia.  Likely contributed by iron  deficiency that was evident on prior labs  - Will obtain iron  panel and ferritin today.  Will replace as needed.

## 2023-10-01 NOTE — Assessment & Plan Note (Addendum)
 Recent onset nausea requiring management.  Likely secondary to cancer Patient could not afford Zofran   - Prescribed Compazine  every 8 hours as needed.

## 2023-10-01 NOTE — Progress Notes (Signed)
 Patient Care Team: Renato Dorothey HERO, NP as PCP - General (Internal Medicine) Davonna Siad, MD as Medical Oncologist (Medical Oncology) Celestia Joesph SQUIBB, RN as Oncology Nurse Navigator (Medical Oncology)  Clinic Day:  10/01/2023  Referring physician: Renato Dorothey HERO, NP   CHIEF COMPLAINT:  CC: Pancreatic adenocarcinoma    ASSESSMENT & PLAN:   Assessment & Plan: [name]  is a 73 y.o. female with Pancreatic adenocarcinoma   Assessment & Plan Pancreatic adenocarcinoma [facility]) Patient was recently diagnosed with pancreatic adenocarcinoma with an FNA on EUS.  Likely early stage. Seen by Dr. Dasie and scheduled for pancreatectomy and splenectomy on 10/22/2023 CT chest with no evidence of metastasis Genetic counseling scheduled for 10/11/2023 CA 19-9 normal  - Discussed with the patient that she most likely will require chemotherapy after surgery with or without chemoradiation. -CT chest with very small lung nodules.  Will repeat CT chest in 6 to 12 months. - Patient is scheduled for pancreatectomy and splenectomy on 10/22/2023  Return to clinic 1 week after surgery to discuss further management Encounter for Prevnar pneumococcal vaccination Since the patient is planned to undergo splenectomy, will administer the following vaccines today Pneumococcal vaccine Prevnar 20 , Hib vaccine and  2 meningococcal vaccines  - Will administer PPSVV 23 8 weeks after and every 5 years after that - Meningococcal vaccine booster every 5 years - Yearly influenza vaccine Normocytic anemia Patient has normocytic anemia.  Likely contributed by iron  deficiency that was evident on prior labs  - Will obtain iron  panel and ferritin today.  Will replace as needed. Drug-induced constipation Likely secondary to pain medication use  - Prescribed Colace twice daily - If unable to afford Colace, recommended patient to get MiraLAX  over-the-counter Right sided abdominal pain Severe abdominal  pain affecting daily activities.  - Continue Percocet 5 mg every 8 hours as needed. Nausea and vomiting, unspecified vomiting type Recent onset nausea requiring management.  Likely secondary to cancer Patient could not afford Zofran   - Prescribed Compazine  every 8 hours as needed.    The patient understands the plans discussed today and is in agreement with them.  She knows to contact our office if she develops concerns prior to her next appointment.  I provided 30 minutes of face-to-face time during this encounter and > 50% was spent counseling as documented under my assessment and plan.    Siad Davonna, MD  [facility] [place] - A DEPT OF [facility] [address] [place] [address] Dept: [phone] Dept Fax: [phone]   No orders of the defined types were placed in this encounter.    ONCOLOGY HISTORY:   Oncology History  Pancreatic adenocarcinoma (HCC)  08/08/2023 Imaging   CT abdomen and pelvis with and without contrast:  IMPRESSION: 1. 2.1 cm hypoenhancing mass within the mid body of the pancreas resulting in mild dilation of the main pancreatic duct within the tail the pancreas and asymmetric pancreatic parenchymal atrophy within this region. These findings are suspicious for a pancreatic adenocarcinoma. Endoscopic ultrasound and biopsy is advised for further evaluation. 2. No evidence regional adenopathy or distant metastatic disease within the abdomen and pelvis. 3. Small hiatal hernia. 4. Status post umbilical hernia repair with mesh.     09/06/2023 Pathology Results   A. PANCREATIC, BODY LESION, FINE NEEDLE ASPIRATION   FINAL MICROSCOPIC DIAGNOSIS:  - Adenocarcinoma    09/17/2023 Initial Diagnosis   Pancreatic adenocarcinoma (HCC)   09/17/2023 Cancer Staging  Staging form: Exocrine Pancreas, AJCC 8th Edition - Clinical stage from 09/17/2023: Stage IB (cT2, cN0, cM0) - Signed by Davonna Siad, MD on 09/18/2023 Stage prefix: Initial diagnosis Total positive nodes: 0       Current Treatment: Plan for pancreatectomy and splenectomy  INTERVAL HISTORY:   [name] is here today for follow up. Patient is accompanied by her son today.She experiences significant lower back pain, which she describes as 'pretty bad' on some days. She takes pain medication twice daily, in the morning and at bedtime, which has resulted in constipation.   She experiences nausea and vomiting, particularly when her bowels do not move. She recalls being prescribed Zofran  for nausea but did not obtain it due to cost. Despite these symptoms, she is able to eat.  She has no other complaints today.  She was seen by Dr. Dasie since we last met and is scheduled for pancreatectomy and splenectomy on 10/22/2023.  She will be getting her splenectomy vaccinations today.  We discussed the CT scan findings in detail and that she does not have any evidence of lung disease at this time.  I have reviewed the past medical history, past surgical history, social history and family history with the patient and they are unchanged from previous note.  ALLERGIES:  is allergic to codeine and egg-derived products.  MEDICATIONS:  Current Outpatient Medications  Medication Sig Dispense Refill   albuterol  (PROVENTIL  HFA;VENTOLIN  HFA) 108 (90 BASE) MCG/ACT inhaler Inhale 2 puffs into the lungs every 6 (six) hours as needed for wheezing or shortness of breath.     ALPRAZolam  (XANAX ) 0.5 MG tablet Take 0.5 mg by mouth 2 (two) times daily.     BREZTRI AEROSPHERE 160-9-4.8 MCG/ACT AERO Inhale 2 puffs into the lungs 2 (two) times daily.     cariprazine (VRAYLAR) 1.5 MG capsule 1 capsule.     escitalopram  (LEXAPRO ) 20 MG tablet Take 20 mg by mouth daily.     ferrous sulfate  325 (65 FE) MG tablet Take 325 mg by mouth every other day.     ketoconazole  (NIZORAL ) 2 % cream Apply to affected area (external use only) twice a day,  continue at least 2-3 days after rash resolves 30 g 0   lisinopril -hydrochlorothiazide  (ZESTORETIC ) 20-12.5 MG tablet Take 1 tablet by mouth daily.     nitrofurantoin  (MACRODANTIN ) 50 MG capsule Take 1 capsule (50 mg total) by mouth at bedtime. 30 capsule 11   omeprazole  (PRILOSEC) 40 MG capsule Take 1 capsule (40 mg total) by mouth 2 (two) times daily before a meal. 60 capsule 6   ondansetron  (ZOFRAN ) 8 MG tablet Take 1 tablet (8 mg total) by mouth every 8 (eight) hours as needed for nausea or vomiting. 20 tablet 1   oxyCODONE -acetaminophen  (PERCOCET/ROXICET) 5-325 MG tablet Take 1 tablet by mouth every 8 (eight) hours as needed for severe pain (pain score 7-10). 30 tablet 0   polyethylene glycol (MIRALAX ) 17 g packet Take 17 g by mouth daily. 30 each 11   No current facility-administered medications for this visit.    REVIEW OF SYSTEMS:   Constitutional: Denies fevers, chills or abnormal weight loss Eyes: Denies blurriness of vision Ears, nose, mouth, throat, and face: Denies mucositis or sore throat Respiratory: Denies cough, dyspnea or wheezes Cardiovascular: Denies palpitation, chest discomfort or lower extremity swelling Gastrointestinal:  Denies nausea, heartburn or change in bowel habits Skin: Denies abnormal skin rashes Lymphatics: Denies new lymphadenopathy or easy bruising Neurological:Denies numbness, tingling or new weaknesses  Behavioral/Psych: Mood is stable, no new changes  All other systems were reviewed with the patient and are negative.   VITALS:  Blood pressure (!) 155/67, pulse 72, temperature 98.2 F (36.8 C), temperature source Oral, resp. rate 19, SpO2 100%.  Wt Readings from Last 3 Encounters:  09/17/23 143 lb 4.8 oz (65 kg)  09/06/23 140 lb (63.5 kg)  08/07/23 139 lb 3.2 oz (63.1 kg)    There is no height or weight on file to calculate BMI.  Performance status (ECOG): 0 - Asymptomatic  PHYSICAL EXAM:   GENERAL:alert, no distress and comfortable SKIN:  skin color, texture, turgor are normal, no rashes or significant lesions LUNGS: clear to auscultation and percussion with normal breathing effort HEART: regular rate & rhythm and no murmurs and no lower extremity edema ABDOMEN:abdomen soft, non-tender and normal bowel sounds Musculoskeletal:no cyanosis of digits and no clubbing  NEURO: alert & oriented x 3 with fluent speech, no focal motor/sensory deficits  LABORATORY DATA:  I have reviewed the data as listed    Component Value Date/Time   NA 137 09/17/2023 1137   NA 140 06/05/2023 1157   K 3.8 09/17/2023 1137   CL 99 09/17/2023 1137   CO2 29 09/17/2023 1137   GLUCOSE 143 (H) 09/17/2023 1137   BUN 14 09/17/2023 1137   BUN 7 (L) 06/05/2023 1157   CREATININE 0.75 09/17/2023 1137   CALCIUM  8.7 (L) 09/17/2023 1137   PROT 6.7 09/17/2023 1137   PROT 6.2 06/05/2023 1157   ALBUMIN 3.9 09/17/2023 1137   ALBUMIN 4.3 06/05/2023 1157   AST 14 (L) 09/17/2023 1137   ALT 16 09/17/2023 1137   ALKPHOS 67 09/17/2023 1137   BILITOT 0.5 09/17/2023 1137   BILITOT 0.2 06/05/2023 1157   GFRNONAA >60 09/17/2023 1137   GFRAA >60 09/01/2019 1359    Lab Results  Component Value Date   WBC 5.0 09/17/2023   NEUTROABS 2.5 09/17/2023   HGB 11.1 (L) 09/17/2023   HCT 35.4 (L) 09/17/2023   MCV 87.4 09/17/2023   PLT 196 09/17/2023      Chemistry      Component Value Date/Time   NA 137 09/17/2023 1137   NA 140 06/05/2023 1157   K 3.8 09/17/2023 1137   CL 99 09/17/2023 1137   CO2 29 09/17/2023 1137   BUN 14 09/17/2023 1137   BUN 7 (L) 06/05/2023 1157   CREATININE 0.75 09/17/2023 1137      Component Value Date/Time   CALCIUM  8.7 (L) 09/17/2023 1137   ALKPHOS 67 09/17/2023 1137   AST 14 (L) 09/17/2023 1137   ALT 16 09/17/2023 1137   BILITOT 0.5 09/17/2023 1137   BILITOT 0.2 06/05/2023 1157      Latest Reference Range & Units 09/17/23 11:37  CA 19-9 0 - 35 U/mL 23    RADIOGRAPHIC STUDIES: I have personally reviewed the radiological  images as listed and agreed with the findings in the report.  CT CHEST W CONTRAST CLINICAL DATA:  Pancreatic cancer, staging.  * Tracking Code: BO *  EXAM: CT CHEST WITH CONTRAST  TECHNIQUE: Multidetector CT imaging of the chest was performed during intravenous contrast administration.  RADIATION DOSE REDUCTION: This exam was performed according to the departmental dose-optimization program which includes automated exposure control, adjustment of the mA and/or kV according to patient size and/or use of iterative reconstruction technique.  CONTRAST:  75mL OMNIPAQUE  IOHEXOL  300 MG/ML  SOLN  COMPARISON:  CT abdomen pelvis August 08, 2023  FINDINGS: Cardiovascular:  Aortic atherosclerosis. Normal size heart. No significant pericardial effusion/thickening.  Mediastinum/Nodes: No pathologically enlarged mediastinal, hilar or axillary lymph nodes. The esophagus is grossly unremarkable.  Lungs/Pleura: Scattered tiny pulmonary nodules for instance a 2 mm nodule in the right upper lobe on image 39/3.  Scattered atelectasis/scarring. No pleural effusion. No pneumothorax.  Upper Abdomen: Known pancreatic lesion better assessed on CT pancreas protocol of the abdomen August 08, 2023. unchanged calcified nodule of the gastric body.  Musculoskeletal: No aggressive lytic or blastic lesion of bone.  IMPRESSION: 1. Scattered tiny pulmonary nodules measuring up to 2 mm, nonspecific but favored benign. Consider short-term interval follow-up dedicated chest CT. 2. No convincing evidence of metastatic disease in the chest. 3. Known pancreatic lesion better assessed on CT pancreas protocol of the abdomen August 08, 2023. 4. Aortic atherosclerosis.  Aortic Atherosclerosis (ICD10-I70.0).  Electronically Signed   By: Reyes Holder M.D.   On: 09/23/2023 10:23

## 2023-10-01 NOTE — Assessment & Plan Note (Addendum)
 Severe abdominal pain affecting daily activities.  - Continue Percocet 5 mg every 8 hours as needed.

## 2023-10-01 NOTE — Assessment & Plan Note (Addendum)
 Likely secondary to pain medication use  - Prescribed Colace twice daily - If unable to afford Colace, recommended patient to get MiraLAX  over-the-counter

## 2023-10-03 ENCOUNTER — Inpatient Hospital Stay: Admitting: Licensed Clinical Social Worker

## 2023-10-03 DIAGNOSIS — C259 Malignant neoplasm of pancreas, unspecified: Secondary | ICD-10-CM

## 2023-10-03 NOTE — Progress Notes (Signed)
 [facility] CSW Progress Note  Clinical Child psychotherapist contacted patient by phone to follow-up on request for information regarding advanced directives.    Interventions: Pt requested an advanced directives packet be mailed to her.  Once pt has the opportunity to look it over w/ her family she will book an appointment to complete and notarize.        Follow Up Plan:  Patient will contact CSW with any support or resource needs    Devere JONELLE Manna, LCSW Clinical Social Worker [facility]

## 2023-10-11 ENCOUNTER — Inpatient Hospital Stay (HOSPITAL_BASED_OUTPATIENT_CLINIC_OR_DEPARTMENT_OTHER): Admitting: Licensed Clinical Social Worker

## 2023-10-11 ENCOUNTER — Encounter: Payer: Self-pay | Admitting: Licensed Clinical Social Worker

## 2023-10-11 ENCOUNTER — Other Ambulatory Visit: Payer: Self-pay | Admitting: Licensed Clinical Social Worker

## 2023-10-11 DIAGNOSIS — Z808 Family history of malignant neoplasm of other organs or systems: Secondary | ICD-10-CM

## 2023-10-11 DIAGNOSIS — C251 Malignant neoplasm of body of pancreas: Secondary | ICD-10-CM | POA: Diagnosis not present

## 2023-10-11 DIAGNOSIS — Z8042 Family history of malignant neoplasm of prostate: Secondary | ICD-10-CM

## 2023-10-11 DIAGNOSIS — C259 Malignant neoplasm of pancreas, unspecified: Secondary | ICD-10-CM

## 2023-10-11 NOTE — Progress Notes (Signed)
 REFERRING PROVIDER: Davonna Siad, MD [phone] [place],  [place] [address]  PRIMARY PROVIDER:  Renato Dorothey HERO, NP  PRIMARY REASON FOR VISIT:  1. Pancreatic adenocarcinoma (HCC)   2. Family history of prostate cancer   3. Family history of melanoma    I connected with Ms. Corporan on 10/11/2023 at 10:30 AM EDT by telephone and verified that I am speaking with the correct person using two identifiers.    Patient location: home Provider location: [facility]   HISTORY OF PRESENT ILLNESS:   Ms. Shingledecker, a 73 y.o. female, was seen for a [facility] cancer genetics consultation at the request of Dr. Davonna due to a personal and family history of cancer.  [name] presents to clinic today to discuss the possibility of a hereditary predisposition to cancer, genetic testing, and to further clarify her future cancer risks, as well as potential cancer risks for family members.   CANCER HISTORY:  In 2025, at the age of 24, Ms. Liberman was diagnosed with pancreatic adenocarcinoma, stage IB. She has surgery scheduled 10/22/2023.  Oncology History  Pancreatic adenocarcinoma (HCC)  08/08/2023 Imaging   CT abdomen and pelvis with and without contrast:  IMPRESSION: 1. 2.1 cm hypoenhancing mass within the mid body of the pancreas resulting in mild dilation of the main pancreatic duct within the tail the pancreas and asymmetric pancreatic parenchymal atrophy within this region. These findings are suspicious for a pancreatic adenocarcinoma. Endoscopic ultrasound and biopsy is advised for further evaluation. 2. No evidence regional adenopathy or distant metastatic disease within the abdomen and pelvis. 3. Small hiatal hernia. 4. Status post umbilical hernia repair with mesh.     09/06/2023 Pathology Results   A. PANCREATIC, BODY LESION, FINE NEEDLE ASPIRATION   FINAL MICROSCOPIC DIAGNOSIS:  - Adenocarcinoma    09/17/2023 Initial Diagnosis   Pancreatic adenocarcinoma  (HCC)   09/17/2023 Cancer Staging   Staging form: Exocrine Pancreas, AJCC 8th Edition - Clinical stage from 09/17/2023: Stage IB (cT2, cN0, cM0) - Signed by Davonna Siad, MD on 09/18/2023 Stage prefix: Initial diagnosis Total positive nodes: 0   09/17/2023 Tumor Marker   CA 19-9: 23- Normal   09/23/2023 Imaging   CT chest w contrast:  IMPRESSION: 1. Scattered tiny pulmonary nodules measuring up to 2 mm, nonspecific but favored benign. Consider short-term interval follow-up dedicated chest CT. 2. No convincing evidence of metastatic disease in the chest. 3. Known pancreatic lesion better assessed on CT pancreas protocol of the abdomen August 08, 2023. 4. Aortic atherosclerosis.    RELEVANT MEDICAL HISTORY:  Menarche was at age 84.  First live birth at age 16.  Ovaries intact: no.  Hysterectomy: yes.  Menopausal status: postmenopausal.  Colonoscopy: yes; history of few polyps. Number of breast biopsies: 1.   Past Medical History:  Diagnosis Date   Anxiety    Asthma    COPD (chronic obstructive pulmonary disease) (HCC)    Hypertension    IBS (irritable bowel syndrome)    IDA (iron  deficiency anemia)     Past Surgical History:  Procedure Laterality Date   ABDOMINAL HYSTERECTOMY     BREAST SURGERY     left lumpectomy   ESOPHAGEAL DILATION N/A 06/12/2023   Procedure: DILATION, ESOPHAGUS;  Surgeon: Cindie Carlin POUR, DO;  [facility];  Service: Endoscopy;  Laterality: N/A;   ESOPHAGOGASTRODUODENOSCOPY N/A 06/12/2023   Procedure: EGD (ESOPHAGOGASTRODUODENOSCOPY);  Surgeon: Cindie Carlin POUR, DO;  Location: [facility];  Service: Endoscopy;  Laterality: N/A;  800am, asa 2   ESOPHAGOGASTRODUODENOSCOPY N/A 09/06/2023   Procedure: EGD (ESOPHAGOGASTRODUODENOSCOPY);  Surgeon: Wilhelmenia Aloha Raddle., MD;  Location: [facility];  Service: Gastroenterology;  Laterality: N/A;   EUS N/A 09/06/2023   Procedure: ULTRASOUND, UPPER GI TRACT, ENDOSCOPIC;  Surgeon: Wilhelmenia  Aloha Raddle., MD;  Location: [facility];  Service: Gastroenterology;  Laterality: N/A;   FINE NEEDLE ASPIRATION  09/06/2023   Procedure: FINE NEEDLE ASPIRATION;  Surgeon: Wilhelmenia Aloha Raddle., MD;  Location: [facility];  Service: Gastroenterology;;   HERNIA REPAIR  2008   umbilical hernia repair   REPLACEMENT TOTAL KNEE Left 2023   TONSILLECTOMY      FAMILY HISTORY:  We obtained a detailed, 4-generation family history.  Significant diagnoses are listed below: Family History  Problem Relation Age of Onset   Lung cancer Mother    Prostate cancer Brother        metastatic   Melanoma Son    Colon cancer Neg Hx    Pancreatic disease Neg Hx    Ms. Crist has 2 sons, 29 and 52. Her oldest son has had melanoma on his arm removed recently. Patient had 1 brother who passed earlier this year at 19 of metastatic prostate cancer.  Ms. Percifield mother had lung cancer and history of smoking, she passed at 31. No other known cancers in the family.  Ms. Freeburg is unaware of previous family history of genetic testing for hereditary cancer risks. There is no reported Ashkenazi Jewish ancestry. There is no known consanguinity.    GENETIC COUNSELING ASSESSMENT: Ms. Jablon is a 73 y.o. female with a personal history of pancreatic cancer and family history of cancer which is somewhat suggestive of a hereditary cancer syndrome and predisposition to cancer. We, therefore, discussed and recommended the following at today's visit.   DISCUSSION: We discussed that approximately 10% of pancreatic cancer is hereditary. Most cases of hereditary pancreatic cancer are associated with BRCA1/BRCA2 genes, although there are other genes associated with hereditary cancer as well. Cancers and risks are gene specific. We discussed that testing is beneficial for several reasons including knowing about cancer risks, identifying potential screening and risk-reduction options that may be appropriate, and to  understand if other family members could be at risk for cancer and allow them to undergo genetic testing.   We reviewed the characteristics, features and inheritance patterns of hereditary cancer syndromes. We also discussed genetic testing, including the appropriate family members to test, the process of testing, insurance coverage and turn-around-time for results. We discussed the implications of a negative, positive and/or variant of uncertain significant result. We recommended Ms. Pellegrino pursue genetic testing for the [facility] CancerNext-Expanded+RNA gene panel.   The CancerNext-Expanded gene panel offered by [facility] and includes sequencing, rearrangement, and RNA analysis for the following 77 genes: AIP, ALK, APC, ATM, AXIN2, BAP1, BARD1, BMPR1A, BRCA1, BRCA2, BRIP1, CDC73, CDH1, CDK4, CDKN1B, CDKN2A, CEBPA, CHEK2, CTNNA1, DDX41, DICER1, ETV6, FH, FLCN, GATA2, LZTR1, MAX, MBD4, MEN1, MET, MLH1, MSH2, MSH3, MSH6, MUTYH, NF1, NF2, NTHL1, PALB2, PHOX2B, PMS2, POT1, PRKAR1A, PTCH1, PTEN, RAD51C, RAD51D, RB1, RET, RPS20, RUNX1, SDHA, SDHAF2, SDHB, SDHC, SDHD, SMAD4, SMARCA4, SMARCB1, SMARCE1, STK11, SUFU, TMEM127, TP53, TSC1, TSC2, VHL, and WT1 (sequencing and deletion/duplication); EGFR, HOXB13, KIT, MITF, PDGFRA, POLD1, and POLE (sequencing only); EPCAM and GREM1 (deletion/duplication only).   Based on Ms. Lesure's personal history of cancer, she meets medical criteria for genetic testing. Despite that she meets criteria, she may still have an out of pocket cost.   PLAN: After  considering the risks, benefits, and limitations, Ms. Segoviano provided informed consent to pursue genetic testing. She will have her blood drawn after her oncology appointment on 9/4 and the blood sample will be sent to [facility] for analysis of the CancerNext-Expanded+RNA Panel. Results should be available within approximately 2-3 weeks' time, at which point they will be disclosed by telephone to Ms.  Saar, as will any additional recommendations warranted by these results. Ms. Nordmann will receive a summary of her genetic counseling visit and a copy of her results once available. This information will also be available in Epic.   Ms. Gammon questions were answered to her satisfaction today. Our contact information was provided should additional questions or concerns arise. Thank you for the referral and allowing us  to share in the care of your patient.   Dena Cary, MS, [facility] Genetic Counselor [email or web address] Phone: [phone]  40 minutes were spent on the date of the encounter in service to the patient including preparation, virtual consultation, documentation and care coordination. Dr. Delinda was available for discussion regarding this case.   _______________________________________________________________________ For Office Staff:  Number of people involved in session: 1 Was an Intern/ student involved with case: no

## 2023-10-16 NOTE — Progress Notes (Signed)
 Surgical Instructions   Your procedure is scheduled on Monday, August 25th, 2025. Report to [facility] Main Entrance A at 5:30 A.M., then check in with the Admitting office. Any questions or running late day of surgery: call [phone]  Questions prior to your surgery date: call [phone], Monday-Friday, 8am-4pm. If you experience any cold or flu symptoms such as cough, fever, chills, shortness of breath, etc. between now and your scheduled surgery, please notify us  at the above number.     Remember:  Do not eat after midnight the night before your surgery   You may drink clear liquids until 4:30 the morning of your surgery.   Clear liquids allowed are: Water, Non-Citrus Juices (without pulp), Carbonated Beverages, Clear Tea (no milk, honey, etc.), Black Coffee Only (NO MILK, CREAM OR POWDERED CREAMER of any kind), and Gatorade.  Patient Instructions  The night before surgery:  No food after midnight. ONLY clear liquids after midnight  The night before your surgery (if you do NOT have diabetes):  Drink ONE (2) Pre-Surgery Clear Ensure prior to midnight. Drink in one sitting. Do not sip.  This drink was given to you during your hospital  pre-op appointment visit.  The day of surgery (if you do NOT have diabetes):  Drink ONE (1) Pre-Surgery Clear Ensure by 4:30 the morning of surgery. Drink in one sitting. Do not sip.  This drink was given to you during your hospital  pre-op appointment visit.   Nothing else to drink after completing the  Pre-Surgery Clear Ensure.          If you have questions, please contact your surgeon's office.     Take these medicines the morning of surgery with A SIP OF WATER: Alprazolam  (Xanax ) Breztri Aerosphere Inhaler Cariprazine (Vraylar) Escitalopram  (Lexapro ) Omeprazole  (Prilosec)   May take these medicines IF NEEDED: Albuterol  Inhaler - bring with you on the day of surgery Oxycodone -acetaminophen  (Percocet) Prochlorperazine   (Compazine )    One week prior to surgery, STOP taking any Aspirin (unless otherwise instructed by your surgeon) Aleve , Naproxen , Ibuprofen, Motrin, Advil, Goody's, BC's, all herbal medications, fish oil, and non-prescription vitamins.                     Do NOT Smoke (Tobacco/Vaping) for 24 hours prior to your procedure.  If you use a CPAP at night, you may bring your mask/headgear for your overnight stay.   You will be asked to remove any contacts, glasses, piercing's, hearing aid's, dentures/partials prior to surgery. Please bring cases for these items if needed.    Patients discharged the day of surgery will not be allowed to drive home, and someone needs to stay with them for 24 hours.  SURGICAL WAITING ROOM VISITATION Patients may have no more than 2 support people in the waiting area - these visitors may rotate.   Pre-op nurse will coordinate an appropriate time for 1 ADULT support person, who may not rotate, to accompany patient in pre-op.  Children under the age of 33 must have an adult with them who is not the patient and must remain in the main waiting area with an adult.  If the patient needs to stay at the hospital during part of their recovery, the visitor guidelines for inpatient rooms apply.  Please refer to the [facility] website for the visitor guidelines for any additional information.   If you received a COVID test during your pre-op visit  it is requested that you wear a mask when  out in public, stay away from anyone that may not be feeling well and notify your surgeon if you develop symptoms. If you have been in contact with anyone that has tested positive in the last 10 days please notify you surgeon.      Pre-operative CHG Bathing Instructions   You can play a key role in reducing the risk of infection after surgery. Your skin needs to be as free of germs as possible. You can reduce the number of germs on your skin by washing with CHG (chlorhexidine  gluconate)  soap before surgery. CHG is an antiseptic soap that kills germs and continues to kill germs even after washing.   DO NOT use if you have an allergy to chlorhexidine /CHG or antibacterial soaps. If your skin becomes reddened or irritated, stop using the CHG and notify one of our RNs at [phone].              TAKE A SHOWER THE NIGHT BEFORE SURGERY AND THE DAY OF SURGERY    Please keep in mind the following:  DO NOT shave, including legs and underarms, 48 hours prior to surgery.   You may shave your face before/day of surgery.  Place clean sheets on your bed the night before surgery Use a clean washcloth (not used since being washed) for each shower. DO NOT sleep with pet's night before surgery.  CHG Shower Instructions:  Wash your face and private area with normal soap. If you choose to wash your hair, wash first with your normal shampoo.  After you use shampoo/soap, rinse your hair and body thoroughly to remove shampoo/soap residue.  Turn the water OFF and apply half the bottle of CHG soap to a CLEAN washcloth.  Apply CHG soap ONLY FROM YOUR NECK DOWN TO YOUR TOES (washing for 3-5 minutes)  DO NOT use CHG soap on face, private areas, open wounds, or sores.  Pay special attention to the area where your surgery is being performed.  If you are having back surgery, having someone wash your back for you may be helpful. Wait 2 minutes after CHG soap is applied, then you may rinse off the CHG soap.  Pat dry with a clean towel  Put on clean pajamas    Additional instructions for the day of surgery: DO NOT APPLY any lotions, deodorants, cologne, or perfumes.   Do not wear jewelry or makeup Do not wear nail polish, gel polish, artificial nails, or any other type of covering on natural nails (fingers and toes) Do not bring valuables to the hospital. [facility] is not responsible for valuables/personal belongings. Put on clean/comfortable clothes.  Please brush your teeth.  Ask your nurse  before applying any prescription medications to the skin.

## 2023-10-17 ENCOUNTER — Encounter (HOSPITAL_COMMUNITY): Payer: Self-pay

## 2023-10-17 ENCOUNTER — Encounter (HOSPITAL_COMMUNITY)
Admission: RE | Admit: 2023-10-17 | Discharge: 2023-10-17 | Disposition: A | Discharge status: Home / Self Care | Source: Ambulatory Visit | Attending: Surgery | Admitting: Surgery

## 2023-10-17 ENCOUNTER — Other Ambulatory Visit: Payer: Self-pay

## 2023-10-17 VITALS — BP 146/55 | HR 81 | Temp 98.3°F | Resp 17 | Ht 60.0 in | Wt 139.3 lb

## 2023-10-17 DIAGNOSIS — Z01812 Encounter for preprocedural laboratory examination: Secondary | ICD-10-CM | POA: Insufficient documentation

## 2023-10-17 DIAGNOSIS — Z01818 Encounter for other preprocedural examination: Secondary | ICD-10-CM

## 2023-10-17 DIAGNOSIS — C259 Malignant neoplasm of pancreas, unspecified: Secondary | ICD-10-CM | POA: Diagnosis not present

## 2023-10-17 HISTORY — DX: Malignant (primary) neoplasm, unspecified: C80.1

## 2023-10-17 LAB — BASIC METABOLIC PANEL WITH GFR
Anion gap: 5 (ref 5–15)
BUN: 8 mg/dL (ref 8–23)
CO2: 26 mmol/L (ref 22–32)
Calcium: 8.7 mg/dL — ABNORMAL LOW (ref 8.9–10.3)
Chloride: 102 mmol/L (ref 98–111)
Creatinine, Ser: 0.75 mg/dL (ref 0.44–1.00)
GFR, Estimated: >60 mL/min (ref >60)
Glucose, Bld: 156 mg/dL — ABNORMAL HIGH (ref 70–99)
Potassium: 3.5 mmol/L (ref 3.5–5.1)
Sodium: 133 mmol/L — ABNORMAL LOW (ref 135–145)

## 2023-10-17 LAB — CBC
HCT: 38 % (ref 36.0–46.0)
Hemoglobin: 11.9 g/dL — ABNORMAL LOW (ref 12.0–15.0)
MCH: 26.3 pg (ref 26.0–34.0)
MCHC: 31.3 g/dL (ref 30.0–36.0)
MCV: 83.9 fL (ref 80.0–100.0)
Platelets: 230 K/uL (ref 150–400)
RBC: 4.53 MIL/uL (ref 3.87–5.11)
RDW: 13 % (ref 11.5–15.5)
WBC: 5.9 K/uL (ref 4.0–10.5)
nRBC: 0 % (ref 0.0–0.2)

## 2023-10-17 NOTE — Progress Notes (Addendum)
 PCP - Renato Dorothey HERO, NP  Cardiologist -  Oncologist-Kandala, Mickiel,   PPM/ICD - denies Device Orders - n/a Rep Notified - n/a  Chest x-ray - 04-14-23 EKG - 04-16-23 Stress Test - denies ECHO - denies Cardiac Cath - denies  Sleep Study - denies CPAP - n/a  Dm -denies  Blood Thinner Instructions:denies Aspirin Instructions:denies  ERAS Protcol - clear liquids until 4:30 am. PRE-SURGERY Ensure  x 2 starting around 1700 on 10-21-23 and one before 4:30 am DOS  COVID TEST- n/a  Anesthesia review: no Patient left arm healed, no open area noted from fall in July 2025  Patient denies shortness of breath, fever, cough and chest pain at PAT appointment   All instructions explained to the patient, with a verbal understanding of the material. Patient agrees to go over the instructions while at home for a better understanding. Patient also instructed to self quarantine after being tested for COVID-19. The opportunity to ask questions was provided.

## 2023-10-21 NOTE — Anesthesia Preprocedure Evaluation (Signed)
 Anesthesia Evaluation  Patient identified by MRN, date of birth, ID band Patient awake    Reviewed: Allergy & Precautions, NPO status , Patient's Chart, lab work & pertinent test results  Airway Mallampati: II  TM Distance: >3 FB Neck ROM: Full    Dental no notable dental hx. (+) Teeth Intact, Dental Advisory Given   Pulmonary asthma , COPD,  COPD inhaler, Current Smoker and Patient abstained from smoking.   Pulmonary exam normal breath sounds clear to auscultation       Cardiovascular hypertension, Pt. on medications Normal cardiovascular exam Rhythm:Regular Rate:Normal     Neuro/Psych  PSYCHIATRIC DISORDERS Anxiety Depression    negative neurological ROS     GI/Hepatic Neg liver ROS,GERD  ,,  Endo/Other  negative endocrine ROS    Renal/GU negative Renal ROS  negative genitourinary   Musculoskeletal negative musculoskeletal ROS (+)    Abdominal   Peds  Hematology negative hematology ROS (+)   Anesthesia Other Findings   Reproductive/Obstetrics                              Anesthesia Physical Anesthesia Plan  ASA: 3  Anesthesia Plan: General   Post-op Pain Management: Tylenol  PO (pre-op)*, Dilaudid  IV and Ketamine  IV*   Induction: Intravenous  PONV Risk Score and Plan: 2 and Dexamethasone , Ondansetron  and Treatment may vary due to age or medical condition  Airway Management Planned: Oral ETT  Additional Equipment: Arterial line  Intra-op Plan:   Post-operative Plan: Extubation in OR  Informed Consent: I have reviewed the patients History and Physical, chart, labs and discussed the procedure including the risks, benefits and alternatives for the proposed anesthesia with the patient or authorized representative who has indicated his/her understanding and acceptance.     Dental advisory given  Plan Discussed with: CRNA  Anesthesia Plan Comments:          Anesthesia  Quick Evaluation

## 2023-10-22 ENCOUNTER — Inpatient Hospital Stay (HOSPITAL_COMMUNITY): Payer: Self-pay | Admitting: Certified Registered Nurse Anesthetist

## 2023-10-22 ENCOUNTER — Inpatient Hospital Stay (HOSPITAL_COMMUNITY)
Admission: RE | Admit: 2023-10-22 | Discharge: 2023-10-28 | DRG: 406 | Disposition: A | Discharge status: Home Health | Attending: Surgery | Admitting: Surgery

## 2023-10-22 ENCOUNTER — Encounter (HOSPITAL_COMMUNITY)
Admission: RE | Disposition: A | Discharge status: Home Health | Payer: Self-pay | Source: Home / Self Care | Attending: Surgery

## 2023-10-22 ENCOUNTER — Other Ambulatory Visit: Payer: Self-pay

## 2023-10-22 ENCOUNTER — Inpatient Hospital Stay (HOSPITAL_COMMUNITY)

## 2023-10-22 ENCOUNTER — Encounter (HOSPITAL_COMMUNITY): Payer: Self-pay | Admitting: Surgery

## 2023-10-22 DIAGNOSIS — Z79891 Long term (current) use of opiate analgesic: Secondary | ICD-10-CM | POA: Diagnosis not present

## 2023-10-22 DIAGNOSIS — Z91012 Allergy to eggs: Secondary | ICD-10-CM

## 2023-10-22 DIAGNOSIS — J4489 Other specified chronic obstructive pulmonary disease: Secondary | ICD-10-CM | POA: Diagnosis present

## 2023-10-22 DIAGNOSIS — I10 Essential (primary) hypertension: Secondary | ICD-10-CM

## 2023-10-22 DIAGNOSIS — Z808 Family history of malignant neoplasm of other organs or systems: Secondary | ICD-10-CM

## 2023-10-22 DIAGNOSIS — Z90710 Acquired absence of both cervix and uterus: Secondary | ICD-10-CM | POA: Diagnosis not present

## 2023-10-22 DIAGNOSIS — Z7951 Long term (current) use of inhaled steroids: Secondary | ICD-10-CM | POA: Diagnosis not present

## 2023-10-22 DIAGNOSIS — Z79899 Other long term (current) drug therapy: Secondary | ICD-10-CM | POA: Diagnosis not present

## 2023-10-22 DIAGNOSIS — C7989 Secondary malignant neoplasm of other specified sites: Secondary | ICD-10-CM | POA: Diagnosis present

## 2023-10-22 DIAGNOSIS — Z87891 Personal history of nicotine dependence: Secondary | ICD-10-CM | POA: Diagnosis not present

## 2023-10-22 DIAGNOSIS — F17210 Nicotine dependence, cigarettes, uncomplicated: Secondary | ICD-10-CM

## 2023-10-22 DIAGNOSIS — Z96652 Presence of left artificial knee joint: Secondary | ICD-10-CM | POA: Diagnosis present

## 2023-10-22 DIAGNOSIS — F419 Anxiety disorder, unspecified: Secondary | ICD-10-CM | POA: Diagnosis present

## 2023-10-22 DIAGNOSIS — Z801 Family history of malignant neoplasm of trachea, bronchus and lung: Secondary | ICD-10-CM

## 2023-10-22 DIAGNOSIS — C259 Malignant neoplasm of pancreas, unspecified: Secondary | ICD-10-CM | POA: Diagnosis present

## 2023-10-22 DIAGNOSIS — Z8042 Family history of malignant neoplasm of prostate: Secondary | ICD-10-CM | POA: Diagnosis not present

## 2023-10-22 DIAGNOSIS — K5900 Constipation, unspecified: Secondary | ICD-10-CM | POA: Diagnosis present

## 2023-10-22 DIAGNOSIS — C772 Secondary and unspecified malignant neoplasm of intra-abdominal lymph nodes: Secondary | ICD-10-CM | POA: Diagnosis present

## 2023-10-22 DIAGNOSIS — Z885 Allergy status to narcotic agent status: Secondary | ICD-10-CM | POA: Diagnosis not present

## 2023-10-22 DIAGNOSIS — C801 Malignant (primary) neoplasm, unspecified: Principal | ICD-10-CM | POA: Diagnosis present

## 2023-10-22 HISTORY — PX: SPLENECTOMY, TOTAL: SHX788

## 2023-10-22 HISTORY — PX: LAPAROSCOPY: SHX197

## 2023-10-22 LAB — CBC
HCT: 30.9 % — ABNORMAL LOW (ref 36.0–46.0)
Hemoglobin: 9.7 g/dL — ABNORMAL LOW (ref 12.0–15.0)
MCH: 26.3 pg (ref 26.0–34.0)
MCHC: 31.4 g/dL (ref 30.0–36.0)
MCV: 83.7 fL (ref 80.0–100.0)
Platelets: 250 K/uL (ref 150–400)
RBC: 3.69 MIL/uL — ABNORMAL LOW (ref 3.87–5.11)
RDW: 13.1 % (ref 11.5–15.5)
WBC: 18.2 K/uL — ABNORMAL HIGH (ref 4.0–10.5)
nRBC: 0 % (ref 0.0–0.2)

## 2023-10-22 LAB — COMPREHENSIVE METABOLIC PANEL WITH GFR
ALT: 38 U/L (ref 0–44)
AST: 48 U/L — ABNORMAL HIGH (ref 15–41)
Albumin: 2.9 g/dL — ABNORMAL LOW (ref 3.5–5.0)
Alkaline Phosphatase: 45 U/L (ref 38–126)
Anion gap: 4 — ABNORMAL LOW (ref 5–15)
BUN: 6 mg/dL — ABNORMAL LOW (ref 8–23)
CO2: 24 mmol/L (ref 22–32)
Calcium: 7.2 mg/dL — ABNORMAL LOW (ref 8.9–10.3)
Chloride: 106 mmol/L (ref 98–111)
Creatinine, Ser: 0.69 mg/dL (ref 0.44–1.00)
GFR, Estimated: >60 mL/min (ref >60)
Glucose, Bld: 243 mg/dL — ABNORMAL HIGH (ref 70–99)
Potassium: 4.1 mmol/L (ref 3.5–5.1)
Sodium: 134 mmol/L — ABNORMAL LOW (ref 135–145)
Total Bilirubin: 0.3 mg/dL (ref 0.0–1.2)
Total Protein: 4.9 g/dL — ABNORMAL LOW (ref 6.5–8.1)

## 2023-10-22 LAB — POCT I-STAT 7, (LYTES, BLD GAS, ICA,H+H)
Acid-Base Excess: 1 mmol/L (ref 0.0–2.0)
Acid-Base Excess: 0 mmol/L (ref 0.0–2.0)
Bicarbonate: 27.1 mmol/L (ref 20.0–28.0)
Bicarbonate: 25.5 mmol/L (ref 20.0–28.0)
Calcium, Ion: 1.11 mmol/L — ABNORMAL LOW (ref 1.15–1.40)
Calcium, Ion: 1.07 mmol/L — ABNORMAL LOW (ref 1.15–1.40)
HCT: 31 % — ABNORMAL LOW (ref 36.0–46.0)
HCT: 29 % — ABNORMAL LOW (ref 36.0–46.0)
Hemoglobin: 10.5 g/dL — ABNORMAL LOW (ref 12.0–15.0)
Hemoglobin: 9.9 g/dL — ABNORMAL LOW (ref 12.0–15.0)
O2 Saturation: 100 %
O2 Saturation: 100 %
Patient temperature: 35.1
Patient temperature: 35.3
Potassium: 4 mmol/L (ref 3.5–5.1)
Potassium: 4 mmol/L (ref 3.5–5.1)
Sodium: 135 mmol/L (ref 135–145)
Sodium: 136 mmol/L (ref 135–145)
TCO2: 29 mmol/L (ref 22–32)
TCO2: 27 mmol/L (ref 22–32)
pCO2 arterial: 45.4 mmHg (ref 32–48)
pCO2 arterial: 41.3 mmHg (ref 32–48)
pH, Arterial: 7.376 (ref 7.35–7.45)
pH, Arterial: 7.391 (ref 7.35–7.45)
pO2, Arterial: 436 mmHg — ABNORMAL HIGH (ref 83–108)
pO2, Arterial: 291 mmHg — ABNORMAL HIGH (ref 83–108)

## 2023-10-22 LAB — ABO/RH: ABO/RH(D): O POS

## 2023-10-22 LAB — GLUCOSE, CAPILLARY
Glucose-Capillary: 122 mg/dL — ABNORMAL HIGH (ref 70–99)
Glucose-Capillary: 150 mg/dL — ABNORMAL HIGH (ref 70–99)
Glucose-Capillary: 152 mg/dL — ABNORMAL HIGH (ref 70–99)
Glucose-Capillary: 257 mg/dL — ABNORMAL HIGH (ref 70–99)

## 2023-10-22 LAB — MRSA NEXT GEN BY PCR, NASAL: MRSA by PCR Next Gen: NOT DETECTED

## 2023-10-22 LAB — PREPARE RBC (CROSSMATCH)

## 2023-10-22 SURGERY — PANCREATECTOMY
Anesthesia: General | Site: Abdomen

## 2023-10-22 MED ORDER — ORAL CARE MOUTH RINSE: 15.0000 mL | OROMUCOSAL | Status: DC | PRN

## 2023-10-22 MED ORDER — SODIUM CHLORIDE 0.9 % IV SOLN: INTRAVENOUS | Status: AC

## 2023-10-22 MED ORDER — CARIPRAZINE HCL 1.5 MG PO CAPS
1.5000 mg | ORAL_CAPSULE | Freq: Every day | ORAL | Status: DC
Start: 2023-10-23 — End: 2023-10-28
  Administered 2023-10-23 – 2023-10-28 (×6): 1.5 mg via ORAL
  Filled 2023-10-22 (×6): qty 1

## 2023-10-22 MED ORDER — METRONIDAZOLE 500 MG/100ML IV SOLN
500.0000 mg | INTRAVENOUS | Status: AC
  Administered 2023-10-22: 500 mg via INTRAVENOUS
  Filled 2023-10-22: qty 100

## 2023-10-22 MED ORDER — DEXAMETHASONE SODIUM PHOSPHATE 10 MG/ML IJ SOLN
INTRAMUSCULAR | Status: DC | PRN
  Administered 2023-10-22: 10 mg via INTRAVENOUS

## 2023-10-22 MED ORDER — BUPIVACAINE HCL (PF) 0.25 % IJ SOLN
INTRAMUSCULAR | Status: DC | PRN
Start: 2023-10-22 — End: 2023-10-22
  Administered 2023-10-22 (×2): 30 mL via PERINEURAL

## 2023-10-22 MED ORDER — SODIUM CHLORIDE 0.9% FLUSH: 9.0000 mL | INTRAVENOUS | Status: DC | PRN

## 2023-10-22 MED ORDER — SUCCINYLCHOLINE CHLORIDE 200 MG/10ML IV SOSY
PREFILLED_SYRINGE | INTRAVENOUS | Status: DC | PRN
  Administered 2023-10-22: 120 mg via INTRAVENOUS

## 2023-10-22 MED ORDER — 0.9 % SODIUM CHLORIDE (POUR BTL) OPTIME
TOPICAL | Status: DC | PRN
Start: 2023-10-22 — End: 2023-10-22
  Administered 2023-10-22: 1000 mL

## 2023-10-22 MED ORDER — ONDANSETRON HCL 4 MG/2ML IJ SOLN
INTRAMUSCULAR | Status: DC | PRN
  Administered 2023-10-22: 4 mg via INTRAVENOUS

## 2023-10-22 MED ORDER — CHLORHEXIDINE GLUCONATE CLOTH 2 % EX PADS
6.0000 | MEDICATED_PAD | Freq: Every day | CUTANEOUS | Status: DC
  Administered 2023-10-22 – 2023-10-23 (×2): 6 via TOPICAL

## 2023-10-22 MED ORDER — METOPROLOL TARTRATE 5 MG/5ML IV SOLN
INTRAVENOUS | Status: DC | PRN
  Administered 2023-10-22 (×3): 1 mg via INTRAVENOUS

## 2023-10-22 MED ORDER — ENSURE PRE-SURGERY PO LIQD: 296.0000 mL | Freq: Once | ORAL | Status: DC

## 2023-10-22 MED ORDER — METHOCARBAMOL 1000 MG/10ML IJ SOLN
500.0000 mg | Freq: Three times a day (TID) | INTRAMUSCULAR | Status: DC
  Administered 2023-10-22 – 2023-10-26 (×12): 500 mg via INTRAVENOUS
  Filled 2023-10-22 (×12): qty 10

## 2023-10-22 MED ORDER — HYDROMORPHONE 1 MG/ML IV SOLN
INTRAVENOUS | Status: DC
  Administered 2023-10-22: 30 mg via INTRAVENOUS

## 2023-10-22 MED ORDER — ORAL CARE MOUTH RINSE: 15.0000 mL | Freq: Once | OROMUCOSAL | Status: DC

## 2023-10-22 MED ORDER — DIAZEPAM 5 MG/ML IJ SOLN: 2.5000 mg | Freq: Four times a day (QID) | INTRAMUSCULAR | Status: DC | PRN

## 2023-10-22 MED ORDER — KETAMINE HCL 10 MG/ML IJ SOLN
INTRAMUSCULAR | Status: DC | PRN
  Administered 2023-10-22: 30 mg via INTRAVENOUS
  Administered 2023-10-22 (×2): 10 mg via INTRAVENOUS

## 2023-10-22 MED ORDER — ESCITALOPRAM OXALATE 20 MG PO TABS
20.0000 mg | ORAL_TABLET | Freq: Every day | ORAL | Status: DC
  Administered 2023-10-23 – 2023-10-28 (×6): 20 mg via ORAL
  Filled 2023-10-22: qty 1
  Filled 2023-10-22: qty 2
  Filled 2023-10-22 (×3): qty 1
  Filled 2023-10-22: qty 2
  Filled 2023-10-22 (×2): qty 1

## 2023-10-22 MED ORDER — KETAMINE HCL 50 MG/5ML IJ SOSY
PREFILLED_SYRINGE | INTRAMUSCULAR | Status: AC
  Filled 2023-10-22: qty 5

## 2023-10-22 MED ORDER — FENTANYL CITRATE (PF) 250 MCG/5ML IJ SOLN
INTRAMUSCULAR | Status: DC | PRN
  Administered 2023-10-22: 50 ug via INTRAVENOUS
  Administered 2023-10-22 (×2): 100 ug via INTRAVENOUS

## 2023-10-22 MED ORDER — SUGAMMADEX SODIUM 200 MG/2ML IV SOLN
INTRAVENOUS | Status: DC | PRN
  Administered 2023-10-22: 200 mg via INTRAVENOUS

## 2023-10-22 MED ORDER — CLEVIDIPINE BUTYRATE 0.5 MG/ML IV EMUL
INTRAVENOUS | Status: DC | PRN
  Administered 2023-10-22: 1.5 mg/h via INTRAVENOUS

## 2023-10-22 MED ORDER — ENOXAPARIN SODIUM 40 MG/0.4ML IJ SOSY
40.0000 mg | PREFILLED_SYRINGE | INTRAMUSCULAR | Status: DC
  Administered 2023-10-23 – 2023-10-28 (×6): 40 mg via SUBCUTANEOUS
  Filled 2023-10-22 (×6): qty 0.4

## 2023-10-22 MED ORDER — MIDAZOLAM HCL 2 MG/2ML IJ SOLN
INTRAMUSCULAR | Status: DC | PRN
  Administered 2023-10-22: 2 mg via INTRAVENOUS

## 2023-10-22 MED ORDER — SODIUM CHLORIDE 0.9 % IV SOLN: INTRAVENOUS | Status: DC | PRN

## 2023-10-22 MED ORDER — ROCURONIUM BROMIDE 10 MG/ML (PF) SYRINGE
PREFILLED_SYRINGE | INTRAVENOUS | Status: DC | PRN
  Administered 2023-10-22: 100 mg via INTRAVENOUS
  Administered 2023-10-22 (×2): 10 mg via INTRAVENOUS

## 2023-10-22 MED ORDER — HEMOSTATIC AGENTS (NO CHARGE) OPTIME
TOPICAL | Status: DC | PRN
Start: 2023-10-22 — End: 2023-10-22
  Administered 2023-10-22: 1 via TOPICAL

## 2023-10-22 MED ORDER — ENSURE PRE-SURGERY PO LIQD: 592.0000 mL | Freq: Once | ORAL | Status: DC

## 2023-10-22 MED ORDER — FENTANYL CITRATE (PF) 250 MCG/5ML IJ SOLN
INTRAMUSCULAR | Status: AC
  Filled 2023-10-22: qty 5

## 2023-10-22 MED ORDER — INSULIN ASPART 100 UNIT/ML IJ SOLN
0.0000 [IU] | INTRAMUSCULAR | Status: DC
  Administered 2023-10-22: 2 [IU] via SUBCUTANEOUS
  Administered 2023-10-22: 8 [IU] via SUBCUTANEOUS
  Administered 2023-10-22: 3 [IU] via SUBCUTANEOUS
  Administered 2023-10-23 (×2): 2 [IU] via SUBCUTANEOUS
  Administered 2023-10-23: 3 [IU] via SUBCUTANEOUS
  Administered 2023-10-23 (×2): 2 [IU] via SUBCUTANEOUS
  Administered 2023-10-24: 3 [IU] via SUBCUTANEOUS
  Administered 2023-10-24: 2 [IU] via SUBCUTANEOUS
  Administered 2023-10-24: 3 [IU] via SUBCUTANEOUS
  Administered 2023-10-25 (×4): 2 [IU] via SUBCUTANEOUS

## 2023-10-22 MED ORDER — LACTATED RINGERS IV SOLN: INTRAVENOUS | Status: DC

## 2023-10-22 MED ORDER — BUDESON-GLYCOPYRROL-FORMOTEROL 160-9-4.8 MCG/ACT IN AERO
2.0000 | INHALATION_SPRAY | Freq: Two times a day (BID) | RESPIRATORY_TRACT | Status: DC
  Administered 2023-10-22 – 2023-10-28 (×12): 2 via RESPIRATORY_TRACT
  Filled 2023-10-22: qty 5.9

## 2023-10-22 MED ORDER — MIDAZOLAM HCL 2 MG/2ML IJ SOLN
INTRAMUSCULAR | Status: AC
  Filled 2023-10-22: qty 2

## 2023-10-22 MED ORDER — ALBUTEROL SULFATE (2.5 MG/3ML) 0.083% IN NEBU
2.5000 mg | INHALATION_SOLUTION | Freq: Four times a day (QID) | RESPIRATORY_TRACT | Status: DC | PRN
  Administered 2023-10-22: 2.5 mg via RESPIRATORY_TRACT
  Filled 2023-10-22: qty 3

## 2023-10-22 MED ORDER — PROPOFOL 500 MG/50ML IV EMUL
INTRAVENOUS | Status: DC | PRN
  Administered 2023-10-22: 50 ug/kg/min via INTRAVENOUS

## 2023-10-22 MED ORDER — DIPHENHYDRAMINE HCL 12.5 MG/5ML PO ELIX: 12.5000 mg | ORAL_SOLUTION | Freq: Four times a day (QID) | ORAL | Status: DC | PRN

## 2023-10-22 MED ORDER — CEFAZOLIN SODIUM-DEXTROSE 2-4 GM/100ML-% IV SOLN
2.0000 g | INTRAVENOUS | Status: AC
  Administered 2023-10-22: 2 g via INTRAVENOUS
  Filled 2023-10-22: qty 100

## 2023-10-22 MED ORDER — DEXAMETHASONE SODIUM PHOSPHATE 10 MG/ML IJ SOLN
INTRAMUSCULAR | Status: DC | PRN
Start: 2023-10-22 — End: 2023-10-22
  Administered 2023-10-22 (×2): 5 mg

## 2023-10-22 MED ORDER — CLEVIDIPINE BUTYRATE 0.5 MG/ML IV EMUL
INTRAVENOUS | Status: AC
  Filled 2023-10-22: qty 50

## 2023-10-22 MED ORDER — ALBUTEROL SULFATE HFA 108 (90 BASE) MCG/ACT IN AERS
INHALATION_SPRAY | RESPIRATORY_TRACT | Status: DC | PRN
Start: 2023-10-22 — End: 2023-10-22
  Administered 2023-10-22 (×2): 2 via RESPIRATORY_TRACT

## 2023-10-22 MED ORDER — PHENYLEPHRINE HCL-NACL 20-0.9 MG/250ML-% IV SOLN
INTRAVENOUS | Status: DC | PRN
  Administered 2023-10-22: 20 ug/min via INTRAVENOUS

## 2023-10-22 MED ORDER — BUPIVACAINE HCL (PF) 0.25 % IJ SOLN
INTRAMUSCULAR | Status: AC
Start: 2023-10-22 — End: 2023-10-22
  Filled 2023-10-22: qty 30

## 2023-10-22 MED ORDER — LABETALOL HCL 5 MG/ML IV SOLN
INTRAVENOUS | Status: DC | PRN
Start: 2023-10-22 — End: 2023-10-22
  Administered 2023-10-22 (×4): 10 mg via INTRAVENOUS

## 2023-10-22 MED ORDER — HYDROMORPHONE 1 MG/ML IV SOLN
INTRAVENOUS | Status: AC
  Filled 2023-10-22: qty 30

## 2023-10-22 MED ORDER — ALBUMIN HUMAN 5 % IV SOLN: INTRAVENOUS | Status: DC | PRN

## 2023-10-22 MED ORDER — HYDRALAZINE HCL 20 MG/ML IJ SOLN: 10.0000 mg | INTRAMUSCULAR | Status: DC | PRN

## 2023-10-22 MED ORDER — PANTOPRAZOLE SODIUM 40 MG PO TBEC: 40.0000 mg | DELAYED_RELEASE_TABLET | Freq: Every day | ORAL | Status: DC

## 2023-10-22 MED ORDER — DIPHENHYDRAMINE HCL 50 MG/ML IJ SOLN
12.5000 mg | Freq: Four times a day (QID) | INTRAMUSCULAR | Status: DC | PRN
Start: 2023-10-22 — End: 2023-10-28

## 2023-10-22 MED ORDER — CHLORHEXIDINE GLUCONATE 0.12 % MT SOLN
15.0000 mL | Freq: Once | OROMUCOSAL | Status: DC
  Filled 2023-10-22: qty 15

## 2023-10-22 MED ORDER — LABETALOL HCL 5 MG/ML IV SOLN: 10.0000 mg | INTRAVENOUS | Status: DC | PRN

## 2023-10-22 MED ORDER — EPHEDRINE SULFATE-NACL 50-0.9 MG/10ML-% IV SOSY
PREFILLED_SYRINGE | INTRAVENOUS | Status: DC | PRN
  Administered 2023-10-22: 5 mg via INTRAVENOUS
  Administered 2023-10-22: 10 mg via INTRAVENOUS
  Administered 2023-10-22: 5 mg via INTRAVENOUS

## 2023-10-22 MED ORDER — NALOXONE HCL 0.4 MG/ML IJ SOLN: 0.4000 mg | INTRAMUSCULAR | Status: DC | PRN

## 2023-10-22 MED ORDER — PROPOFOL 10 MG/ML IV BOLUS
INTRAVENOUS | Status: DC | PRN
  Administered 2023-10-22: 120 mg via INTRAVENOUS

## 2023-10-22 SURGICAL SUPPLY — 92 items
BAG BILE T-TUBES STRL (MISCELLANEOUS) IMPLANT
BAG COUNTER SPONGE SURGICOUNT (BAG) ×3 IMPLANT
BIOPATCH RED 1 DISK 7.0 (GAUZE/BANDAGES/DRESSINGS) ×3 IMPLANT
BLADE CLIPPER SURG (BLADE) IMPLANT
BLADE EXTENDED COATED 6.5IN (ELECTRODE) IMPLANT
BLADE SURG 10 STRL SS (BLADE) IMPLANT
BLADE SURG 15 STRL SS SAFETY (BLADE) IMPLANT
CANISTER SUCTION 3000ML PPV (SUCTIONS) IMPLANT
CHLORAPREP W/TINT 26 (MISCELLANEOUS) ×3 IMPLANT
CLAMP SUTURE YELLOW 5 PAIRS (MISCELLANEOUS) IMPLANT
CLIP TI LARGE 6 (CLIP) IMPLANT
CLIP TI MEDIUM 24 (CLIP) IMPLANT
CLIP TI WIDE RED SMALL 24 (CLIP) IMPLANT
CNTNR URN SCR LID CUP LEK RST (MISCELLANEOUS) IMPLANT
COVER SURGICAL LIGHT HANDLE (MISCELLANEOUS) ×3 IMPLANT
DERMABOND ADVANCED .7 DNX12 (GAUZE/BANDAGES/DRESSINGS) ×3 IMPLANT
DRAIN CHANNEL 19F RND (DRAIN) ×3 IMPLANT
DRAPE INCISE IOBAN 66X45 STRL (DRAPES) ×3 IMPLANT
DRAPE WARM FLUID 44X44 (DRAPES) ×3 IMPLANT
DRSG TEGADERM 4X4.75 (GAUZE/BANDAGES/DRESSINGS) ×3 IMPLANT
DRSG TELFA 3X8 NADH STRL (GAUZE/BANDAGES/DRESSINGS) IMPLANT
ELECT BLADE 6.5 EXT (BLADE) ×3 IMPLANT
ELECT CAUTERY BLADE 6.4 (BLADE) ×3 IMPLANT
ELECT PAD DSPR THERM+ ADLT (MISCELLANEOUS) IMPLANT
ELECTRODE REM PT RTRN 9FT ADLT (ELECTROSURGICAL) ×3 IMPLANT
EVACUATOR SILICONE 100CC (DRAIN) ×3 IMPLANT
GAUZE 4X4 16PLY ~~LOC~~+RFID DBL (SPONGE) IMPLANT
GAUZE SPONGE 2X2 STRL 8-PLY (GAUZE/BANDAGES/DRESSINGS) IMPLANT
GAUZE SPONGE 4X4 12PLY STRL (GAUZE/BANDAGES/DRESSINGS) IMPLANT
GLOVE BIOGEL PI IND STRL 6 (GLOVE) ×3 IMPLANT
GLOVE BIOGEL PI MICRO STRL 5.5 (GLOVE) ×3 IMPLANT
GOWN STRL REUS W/ TWL LRG LVL3 (GOWN DISPOSABLE) ×6 IMPLANT
HAND PENCIL TRP OPTION (MISCELLANEOUS) IMPLANT
HANDLE SUCTION POOLE (INSTRUMENTS) ×3 IMPLANT
HEMOSTAT SNOW SURGICEL 2X4 (HEMOSTASIS) IMPLANT
IRRIGATION SUCT STRKRFLW 2 WTP (MISCELLANEOUS) IMPLANT
KIT BASIN OR (CUSTOM PROCEDURE TRAY) ×3 IMPLANT
KIT MARKER MARGIN INK (KITS) IMPLANT
KIT TURNOVER KIT B (KITS) ×3 IMPLANT
LIGASURE IMPACT 36 18CM CVD LR (INSTRUMENTS) IMPLANT
LOOP VASCLR MAXI BLUE 18IN ST (MISCELLANEOUS) ×3 IMPLANT
LOOPS VASCLR MAXI BLUE 18IN ST (MISCELLANEOUS) ×2 IMPLANT
NDL INSUFFLATION 14GA 120MM (NEEDLE) ×3 IMPLANT
NEEDLE INSUFFLATION 14GA 120MM (NEEDLE) ×2 IMPLANT
NS IRRIG 1000ML POUR BTL (IV SOLUTION) ×3 IMPLANT
PACK GENERAL/GYN (CUSTOM PROCEDURE TRAY) ×3 IMPLANT
PAD ARMBOARD POSITIONER FOAM (MISCELLANEOUS) ×6 IMPLANT
RELOAD STAPLE 60 2.6 WHT THN (STAPLE) IMPLANT
RELOAD STAPLE 60 3.6 BLU REG (STAPLE) IMPLANT
RELOAD STAPLE 60 3.8 GOLD REG (STAPLE) IMPLANT
RELOAD STAPLE 60 4.1 GRN THCK (STAPLE) IMPLANT
RETRACTOR WOUND ALXS 34CM XLRG (MISCELLANEOUS) ×3 IMPLANT
SCISSORS LAP 5X35 DISP (ENDOMECHANICALS) IMPLANT
SET TUBE SMOKE EVAC HIGH FLOW (TUBING) ×3 IMPLANT
SHEARS FOC LG CVD HARMONIC 17C (MISCELLANEOUS) IMPLANT
SLEEVE Z-THREAD 5X100MM (TROCAR) ×3 IMPLANT
SPONGE T-LAP 18X18 ~~LOC~~+RFID (SPONGE) ×3 IMPLANT
STAPLE ECHEON FLEX 60 POW ENDO (STAPLE) IMPLANT
STAPLE LINE REINFORCEMENT LAP (STAPLE) IMPLANT
STAPLER SKIN PROX 35W (STAPLE) IMPLANT
SUT ETHILON 2 0 FS 18 (SUTURE) ×3 IMPLANT
SUT MNCRL AB 4-0 PS2 18 (SUTURE) ×3 IMPLANT
SUT PDS AB 1 TP1 54 (SUTURE) IMPLANT
SUT PDS AB 1 TP1 96 (SUTURE) ×6 IMPLANT
SUT PROLENE 0 CT 1 30 (SUTURE) IMPLANT
SUT PROLENE 2 0 SH 30 (SUTURE) IMPLANT
SUT PROLENE 3 0 SH 48 (SUTURE) ×3 IMPLANT
SUT PROLENE 4-0 RB1 .5 CRCL 36 (SUTURE) ×3 IMPLANT
SUT SILK 2 0 TIES 10X30 (SUTURE) ×3 IMPLANT
SUT SILK 2 0SH CR/8 30 (SUTURE) ×3 IMPLANT
SUT SILK 3 0 TIES 10X30 (SUTURE) ×3 IMPLANT
SUT SILK 3 0SH CR/8 30 (SUTURE) ×3 IMPLANT
SUT VIC AB 2-0 CT1 TAPERPNT 27 (SUTURE) IMPLANT
SUT VIC AB 2-0 SH 18 (SUTURE) IMPLANT
SUT VIC AB 3-0 MH 27 (SUTURE) IMPLANT
SUT VIC AB 3-0 SH 18 (SUTURE) IMPLANT
SUT VIC AB 3-0 SH 27X BRD (SUTURE) IMPLANT
SUT VIC AB 3-0 SH 8-18 (SUTURE) IMPLANT
SUT VIC AB 4-0 SH 18 (SUTURE) IMPLANT
SUT VICRYL 2 0 18 UND BR (SUTURE) IMPLANT
SUT VICRYL 3 0 BR 18 UND (SUTURE) IMPLANT
SYR BULB IRRIG 60ML STRL (SYRINGE) ×3 IMPLANT
TOWEL GREEN STERILE (TOWEL DISPOSABLE) ×3 IMPLANT
TOWEL GREEN STERILE FF (TOWEL DISPOSABLE) ×3 IMPLANT
TRAY FOLEY MTR SLVR 16FR STAT (SET/KITS/TRAYS/PACK) ×3 IMPLANT
TRAY LAPAROSCOPIC MC (CUSTOM PROCEDURE TRAY) ×3 IMPLANT
TROCAR BALLN 12MMX100 BLUNT (TROCAR) IMPLANT
TROCAR Z-THREAD OPTICAL 5X100M (TROCAR) ×3 IMPLANT
TUBE CONNECTING 12X1/4 (SUCTIONS) IMPLANT
VASCULAR TIE MINI RED 18IN STL (MISCELLANEOUS) ×3 IMPLANT
WARMER LAPAROSCOPE (MISCELLANEOUS) ×3 IMPLANT
YANKAUER SUCT BULB TIP NO VENT (SUCTIONS) IMPLANT

## 2023-10-22 NOTE — H&P (Signed)
 [name] is an 73 y.o. female.   Chief Complaint: pancreatic cancer HPI: Patty Oetken. [name] is a 73 y.o. female who was recently diagnosed with pancreatic cancer.   She has been having abdominal pain and constipation for several months. She previously had a CT scan in February of this year which showed mild edema of the pancreatic tail. Lipase was 71. Her symptoms have persisted and she had a follow up CT scan on 6/11, which showed a new 2.1cm mass in the body of the pancreas, with upstream ductal dilation. She had an EUS on 7/10, which showed a 14mm hypoechoic mass in the body of the pancreas, with no invasion into the SMA, celiac trunk, or portosplenic confluence. This was staged as a uT1N0, and FNA confirmed adenocarcinoma. CA19-9 was normal at 23. Staging scans did not show evidence of metastatic disease.   Previous abdominal surgeries include an umbilical hernia repair and hysterectomy. She has COPD and uses inhalers but is not on oxygen. She quit smoking several months ago. She functions independently and lives with her grandson.   Past Medical History:  Diagnosis Date   Anxiety    Asthma    Cancer (HCC)    COPD (chronic obstructive pulmonary disease) (HCC)    Hypertension    IBS (irritable bowel syndrome)    IDA (iron  deficiency anemia)     Past Surgical History:  Procedure Laterality Date   ABDOMINAL HYSTERECTOMY     BREAST SURGERY     left lumpectomy   ESOPHAGEAL DILATION N/A 06/12/2023   Procedure: DILATION, ESOPHAGUS;  Surgeon: Cindie Carlin POUR, DO;  Location: [facility];  Service: Endoscopy;  Laterality: N/A;   ESOPHAGOGASTRODUODENOSCOPY N/A 06/12/2023   Procedure: EGD (ESOPHAGOGASTRODUODENOSCOPY);  Surgeon: Cindie Carlin POUR, DO;  Location: [facility];  Service: Endoscopy;  Laterality: N/A;  800am, asa 2   ESOPHAGOGASTRODUODENOSCOPY N/A 09/06/2023   Procedure: EGD (ESOPHAGOGASTRODUODENOSCOPY);  Surgeon: Wilhelmenia Aloha Raddle., MD;  Location: [facility];   Service: Gastroenterology;  Laterality: N/A;   EUS N/A 09/06/2023   Procedure: ULTRASOUND, UPPER GI TRACT, ENDOSCOPIC;  Surgeon: Wilhelmenia Aloha Raddle., MD;  Location: [facility];  Service: Gastroenterology;  Laterality: N/A;   FINE NEEDLE ASPIRATION  09/06/2023   Procedure: FINE NEEDLE ASPIRATION;  Surgeon: Wilhelmenia Aloha Raddle., MD;  Location: [facility];  Service: Gastroenterology;;   HERNIA REPAIR  2008   umbilical hernia repair   REPLACEMENT TOTAL KNEE Left 2023   TONSILLECTOMY      Family History  Problem Relation Age of Onset   Lung cancer Mother    Prostate cancer Brother        metastatic   Melanoma Son    Colon cancer Neg Hx    Pancreatic disease Neg Hx    Social History:  reports that she has been smoking cigarettes. She started smoking about 6 months ago. She has never used smokeless tobacco. She reports that she does not drink alcohol and does not use drugs.  Allergies:  Allergies  Allergen Reactions   Codeine Nausea And Vomiting   Egg-Derived Products Other (See Comments)    Medications Prior to Admission  Medication Sig Dispense Refill   albuterol  (PROVENTIL  HFA;VENTOLIN  HFA) 108 (90 BASE) MCG/ACT inhaler Inhale 2 puffs into the lungs every 6 (six) hours as needed for wheezing or shortness of breath.     ALPRAZolam  (XANAX ) 0.5 MG tablet Take 0.5 mg by mouth 2 (two) times daily.     BREZTRI  AEROSPHERE 160-9-4.8 MCG/ACT AERO Inhale 2  puffs into the lungs 2 (two) times daily.     cariprazine  (VRAYLAR ) 1.5 MG capsule Take 1.5 mg by mouth daily.     docusate sodium  (COLACE) 100 MG capsule Take 1 capsule (100 mg total) by mouth 2 (two) times daily. (Patient taking differently: Take 100 mg by mouth at bedtime.) 10 capsule 0   escitalopram  (LEXAPRO ) 20 MG tablet Take 20 mg by mouth daily.     lisinopril -hydrochlorothiazide  (ZESTORETIC ) 20-12.5 MG tablet Take 1 tablet by mouth daily.     omeprazole  (PRILOSEC) 40 MG capsule Take 1 capsule (40 mg total) by mouth 2 (two)  times daily before a meal. 60 capsule 6   oxyCODONE -acetaminophen  (PERCOCET/ROXICET) 5-325 MG tablet Take 1 tablet by mouth every 8 (eight) hours as needed for severe pain (pain score 7-10). (Patient taking differently: Take 0.5 tablets by mouth every 8 (eight) hours as needed for severe pain (pain score 7-10).) 30 tablet 0   prochlorperazine  (COMPAZINE ) 10 MG tablet Take 1 tablet (10 mg total) by mouth every 6 (six) hours as needed for nausea or vomiting. 30 tablet 0   ketoconazole  (NIZORAL ) 2 % cream Apply to affected area (external use only) twice a day, continue at least 2-3 days after rash resolves (Patient not taking: Reported on 10/11/2023) 30 g 0   nitrofurantoin  (MACRODANTIN ) 50 MG capsule Take 1 capsule (50 mg total) by mouth at bedtime. (Patient not taking: Reported on 10/11/2023) 30 capsule 11   polyethylene glycol (MIRALAX ) 17 g packet Take 17 g by mouth daily. (Patient not taking: Reported on 10/11/2023) 30 each 11    Results for orders placed or performed during the hospital encounter of 10/22/23 (from the past 48 hours)  Prepare RBC (crossmatch)     Status: None   Collection Time: 10/22/23  5:51 AM  Result Value Ref Range   Order Confirmation      ORDER PROCESSED BY BLOOD BANK Performed at [facility], [address] [place], [place] [address]   ABO/Rh     Status: None (Preliminary result)   Collection Time: 10/22/23  6:00 AM  Result Value Ref Range   ABO/RH(D) PENDING    No results found.  Review of Systems  Blood pressure (!) 161/67, pulse 73, temperature 98.1 F (36.7 C), temperature source Oral, resp. rate 18, height 5' (1.524 m), weight 64.4 kg, SpO2 98%. Physical Exam Vitals reviewed.  Constitutional:      General: She is not in acute distress.    Appearance: Normal appearance.  HENT:     Head: Normocephalic and atraumatic.  Pulmonary:     Effort: Pulmonary effort is normal. No respiratory distress.  Abdominal:     General: There is no distension.      Palpations: Abdomen is soft.  Skin:    General: Skin is warm and dry.     Coloration: Skin is not jaundiced.  Neurological:     General: No focal deficit present.     Mental Status: She is alert and oriented to person, place, and time.      Assessment/Plan 73 yo female with pancreatic adenocarcinoma of the body. Proceed to the OR for staging laparoscopy, and open distal pancreatectomy with splenectomy. Post-splenectomy vaccines were given on 8/4. The procedure details were reviewed and informed consent obtained. All questions answered. Patient to be admitted to inpatient postoperatively.  Leonor LITTIE Dawn, MD 10/22/2023, 7:05 AM

## 2023-10-22 NOTE — Anesthesia Postprocedure Evaluation (Signed)
 Anesthesia Post Note  Patient: [name]  Procedure(s) Performed: PANCREATECTOMY (Abdomen) LAPAROSCOPY, DIAGNOSTIC (Abdomen) SPLENECTOMY (Abdomen)     Patient location during evaluation: PACU Anesthesia Type: General Level of consciousness: awake and alert Pain management: pain level controlled Vital Signs Assessment: post-procedure vital signs reviewed and stable Respiratory status: spontaneous breathing, nonlabored ventilation, respiratory function stable and patient connected to nasal cannula oxygen Cardiovascular status: blood pressure returned to baseline and stable Postop Assessment: no apparent nausea or vomiting Anesthetic complications: no   No notable events documented.  Last Vitals:  Vitals:   10/22/23 1330 10/22/23 1345  BP: (!) 148/62 (!) 160/63  Pulse: 67 67  Resp: 16 15  Temp:    SpO2: 97% 97%    Last Pain:  Vitals:   10/22/23 1330  TempSrc:   PainSc: Asleep                 Rome Ade

## 2023-10-22 NOTE — Anesthesia Procedure Notes (Signed)
 Arterial Line Insertion Start/End8/25/2025 7:00 AM, 10/22/2023 7:05 AM Performed by: Zelphia Norleen HERO, CRNA, CRNA  Patient location: Pre-op. Preanesthetic checklist: patient identified, IV checked, site marked, risks and benefits discussed, surgical consent, monitors and equipment checked, pre-op evaluation, timeout performed and anesthesia consent Lidocaine  1% used for infiltration Left, radial was placed Catheter size: 20 G Hand hygiene performed  and maximum sterile barriers used   Attempts: 1 Procedure performed without using ultrasound guided technique. Following insertion, dressing applied and Biopatch. Post procedure assessment: normal and unchanged  Patient tolerated the procedure well with no immediate complications.

## 2023-10-22 NOTE — Anesthesia Procedure Notes (Signed)
 Procedure Name: Intubation Date/Time: 10/22/2023 7:50 AM  Performed by: Zelphia Norleen HERO, CRNAPre-anesthesia Checklist: Patient identified, Emergency Drugs available, Suction available and Patient being monitored Patient Re-evaluated:Patient Re-evaluated prior to induction Oxygen Delivery Method: Circle system utilized Preoxygenation: Pre-oxygenation with 100% oxygen Induction Type: IV induction and Rapid sequence Laryngoscope Size: Mac and 3 Grade View: Grade I Tube type: Oral Tube size: 7.0 mm Number of attempts: 1 Airway Equipment and Method: Stylet Placement Confirmation: ETT inserted through vocal cords under direct vision, positive ETCO2 and breath sounds checked- equal and bilateral Secured at: 21 cm Tube secured with: Tape Dental Injury: Teeth and Oropharynx as per pre-operative assessment

## 2023-10-22 NOTE — Anesthesia Procedure Notes (Signed)
 Anesthesia Regional Block: Quadratus lumborum   Pre-Anesthetic Checklist: , timeout performed,  Correct Patient, Correct Site, Correct Laterality,  Correct Procedure, Correct Position, site marked,  Risks and benefits discussed,  Pre-op evaluation,  At surgeon's request and post-op pain management  Laterality: Right  Prep: Maximum Sterile Barrier Precautions used, chloraprep       Needles:  Injection technique: Single-shot  Needle Type: Echogenic Stimulator Needle     Needle Length: 9cm  Needle Gauge: 21     Additional Needles:   Procedures:,,,, ultrasound used (permanent image in chart),,    Narrative:  Start time: 10/22/2023 7:14 AM End time: 10/22/2023 7:18 AM Injection made incrementally with aspirations every 5 mL. Anesthesiologist: Niels Marien CROME, MD

## 2023-10-22 NOTE — Op Note (Signed)
 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 She had follow up imaging showing a mass in the body of the pancreas. EUS confirmed these findings, and FNA showed adenocarcinoma. This was staged as a uT1N0. Staging workup did not show any evidence of metastatic disease. After a discussion of the risks and benefits of surgery, she consented to proceed with a distal pancreatectomy with splenectomy.  Findings: No evidence of metastatic disease within the abdomen. Firm mass in the body of the pancreas near the neck, invading the splenic vein near the confluence with the portal vein. A side-bite repair of the PV/SMV was performed, with approximately 20% narrowing of the SMV/PV. Doppler US  following repair showed normal flow through the portal vein.  Procedure details: Informed consent was obtained in the preoperative area prior to the procedure. The patient was brought to the operating room and placed on the table in the supine position. General anesthesia was induced and appropriate lines and drains were placed for intraoperative monitoring. Perioperative antibiotics were administered per SCIP guidelines. The abdomen was prepped and draped in the usual sterile fashion. A pre-procedure timeout was taken verifying patient identity, surgical site and procedure to be performed.  A small supraumbilical  skin incision was made, the umbilical stalk was grasped and elevated, and a Veress needle was inserted through the fascia.  Intraperitoneal placement was confirmed with a saline drop test and the abdomen was insufflated.  A 5 mm Visiport was placed.  The peritoneal cavity was inspected, including the peritoneal surface, both hemidiaphragms, and the liver.  There was no evidence of metastatic disease within the abdomen.  The port was removed and the abdomen was desufflated.  An upper midline skin incision was made and extended just below the umbilicus.  The subcutaneous tissue was divided with cautery to expose the fascia.  The fascia was elevated and opened along the linea alba, and the peritoneum was opened.  There was a small piece of circular mesh from the previous hernia repair at the umbilicus.  The mesh was divided in order to open the fascia fully.  There were some omental adhesions to the mesh which were taken down using blunt dissection and cautery.  The falciform ligament was taken down off the abdominal with cautery and ligated with LigaSure.  An Horticulturist, commercial and Bookwalter fixed retractor were placed.  The gastrocolic omentum was opened with LigaSure to enter the lesser sac.  There were some thin filmy adhesions between the stomach and the pancreas which were divided with cautery.  The gastrocolic omentum was further opened with LigaSure, working up towards the fundus of the stomach.  The short gastric vessels were divided with LigaSure.  The pancreas was palpated and was diffusely firm and atrophic.  Intraoperative ultrasound was performed and confirmed the location of the mass in the body of the pancreas just lateral to the neck, with distal dilation of the pancreatic duct. The neck of the pancreas felt soft and normal in texture. The middle  colic vein was identified and followed down to the inferior border of the pancreas.  The inferior border of the pancreas was dissected out and the SMV  was visualized.  The duodenum was widely kocherized in order to obtain control of the superior mesenteric vessels.  A plane was then created between the SMV and the neck of the pancreas using gentle blunt dissection.  The pancreas easily separated from the SMV and portal vein, with no evidence of tumor invasion.  The common hepatic artery was identified on the superior border of the pancreas.  There was no evidence of tumor invasion into the hepatic artery.  3-0 Vicryl stay sutures were then placed on the neck of the pancreas, and the neck of the pancreas was partially divided with cautery.  The posterior part of the neck was divided sharply.  A frozen section was sent from the neck of the pancreas which did not show any malignant cells, however there was no ductal tissue present in this initial margin.  Thus an additional margin of tissue from the more proximal pancreatic neck was taken and sent for frozen. There was pancreatic ductal tissue in this specimen, and no evidence of malignancy.  The lymphatic tissue between the common hepatic artery and the splenic artery was dissected out and excised, and sent for routine pathology.  The neck and the body of the pancreas were dissected off the retroperitoneum using blunt dissection and LigaSure.  The inferior mesenteric vein was circumferentially dissected out and ligated with a 3-0 silk suture ligature.  At this point the body of the pancreas was mobile.  The splenic artery was circumferentially dissected out near the origin.  The splenic artery was clamped, and there remained a strongly palpable pulse within the common hepatic artery and the porta hepatis.  The splenic artery was doubly ligated with a 3-0 Prolene suture ligature and a 2-0 silk suture.  A large metal clip was also placed on the proximal splenic artery.  The distal side of the splenic artery was clipped, and the splenic artery was divided sharply.  The splenic vein was then circumferentially dissected  out.  There was some tumor abutment very close to the porto-splenic confluence, we elected to fully mobilize the tail of the pancreas and the spleen, and to divide the splenic vein last.  The tail the pancreas was taken off the retroperitoneum using LigaSure.  The splenic flexure of the colon was mobilized using cautery.  The splenic attachments to the retroperitoneum and the diaphragm were taken down using blunt dissection and cautery.  Once the spleen and the tail the pancreas were fully mobilized, the specimen was elevated up into the wound.  The specimen remained attached only by the splenic vein.  A Satinsky clamp was placed along the longitudinal axis of the SMV to occlude the splenic vein.  The splenic vein was then sharply transected at the insertion into the SMV, leaving a cuff of tissue to close.  There was some bleeding which was quickly controlled by repositioning the Satinsky clamp across the SMV/PV.  The specimen was passed off the field.  The portal vein was examined.  The venotomy at the site of the splenic vein insertion had healthy edges with no gross evidence of residual tumor.  The venotomy was closed with a running 4-0 Prolene suture.  On removal of the clamp, the vein closure was hemostatic.  Ultrasound was performed with color Doppler to confirm patency of the main portal vein and the SMV,  with no thrombus within the vein.  The pancreatic specimen was oriented and sent for routine pathology.  The abdomen was irrigated with warm saline and appeared hemostatic.  The pancreatic neck margin was examined, and the pancreatic duct was visualized and probed. The duct was small, approximately 2mm in diameter.  The pancreatic duct was closed using a 5-0 PDS U-stitch.  The stump of the pancreas was then closed using 3-0 Vicryl horizontal mattress sutures, which were placed through the entire thickness of the gland to close the capsule.  The abdomen was again irrigated and appeared hemostatic.  The  PV/SMV appeared narrowed by about 20% at the site of the venotomy repair, however color Doppler ultrasound was again performed and confirmed patency of the main portal vein and the main trunk of the SMV with good flow.  The splenic artery stump appeared hemostatic. Surgical snow was placed on both the SMV closure and the splenic artery stump. A falciform ligament flap was mobilized and placed adjacent to the remnant pancreas, and over the splenic artery stump.  A 19-Fr JP drain was placed adjacent to the remnant pancreas and through the splenic fossa and brought out through the left upper quadrant abdominal wall.  The drain was secured to the skin with 2-0 nylon suture.  The retractors and wound protector were removed.  The fascia was closed at midline using a running 0 Prolene suture, incorporating the umbilical mesh into the closure.  Scarpa's layer was closed with a running 3-0 Vicryl, and the skin was closed with running 4-0 Monocryl subcuticular suture.  Dermabond was applied.  The patient tolerated the procedure well with no apparent complications. All counts were correct x2 at the end of the procedure. The patient was extubated and taken to PACU in stable condition.  Leonor Dawn, MD 10/22/23 12:23 PM   Intraoperative ultrasound:   Pancreatic mass (adjacent to splenic vein at insertion into SMV)   Main portal vein at completion of resection

## 2023-10-22 NOTE — Transfer of Care (Signed)
 Immediate Anesthesia Transfer of Care Note  Patient: [name]  Procedure(s) Performed: PANCREATECTOMY (Abdomen) LAPAROSCOPY, DIAGNOSTIC (Abdomen) SPLENECTOMY (Abdomen)  Patient Location: PACU  Anesthesia Type:GA combined with regional for post-op pain  Level of Consciousness: sedated  Airway & Oxygen Therapy: Patient Spontanous Breathing and Patient connected to face mask oxygen  Post-op Assessment: Report given to RN and Post -op Vital signs reviewed and stable  Post vital signs: Reviewed and stable  Last Vitals:  Vitals Value Taken Time  BP 115/46 10/22/23 12:15  Temp 36.4 C 10/22/23 12:05  Pulse 63 10/22/23 12:18  Resp 14 10/22/23 12:18  SpO2 100 % 10/22/23 12:18  Vitals shown include unfiled device data.  Last Pain:  Vitals:   10/22/23 1205  TempSrc:   PainSc: Asleep         Complications: No notable events documented.

## 2023-10-22 NOTE — Anesthesia Procedure Notes (Signed)
 Anesthesia Regional Block: Quadratus lumborum   Pre-Anesthetic Checklist: , timeout performed,  Correct Patient, Correct Site, Correct Laterality,  Correct Procedure, Correct Position, site marked,  Risks and benefits discussed,  Pre-op evaluation,  At surgeon's request and post-op pain management  Laterality: Left  Prep: Maximum Sterile Barrier Precautions used, chloraprep       Needles:  Injection technique: Single-shot  Needle Type: Echogenic Stimulator Needle     Needle Length: 9cm  Needle Gauge: 21     Additional Needles:   Procedures:,,,, ultrasound used (permanent image in chart),,    Narrative:  Start time: 10/22/2023 7:10 AM End time: 10/22/2023 7:14 AM Injection made incrementally with aspirations every 5 mL. Anesthesiologist: Niels Marien CROME, MD

## 2023-10-23 ENCOUNTER — Encounter (HOSPITAL_COMMUNITY): Payer: Self-pay

## 2023-10-23 ENCOUNTER — Other Ambulatory Visit: Payer: Self-pay

## 2023-10-23 ENCOUNTER — Encounter (HOSPITAL_COMMUNITY): Payer: Self-pay | Admitting: Surgery

## 2023-10-23 ENCOUNTER — Ambulatory Visit (HOSPITAL_COMMUNITY): Admit: 2023-10-23 | Admitting: Internal Medicine

## 2023-10-23 ENCOUNTER — Other Ambulatory Visit (HOSPITAL_BASED_OUTPATIENT_CLINIC_OR_DEPARTMENT_OTHER): Payer: Self-pay

## 2023-10-23 LAB — CBC
HCT: 31.8 % — ABNORMAL LOW (ref 36.0–46.0)
Hemoglobin: 10.2 g/dL — ABNORMAL LOW (ref 12.0–15.0)
MCH: 26.2 pg (ref 26.0–34.0)
MCHC: 32.1 g/dL (ref 30.0–36.0)
MCV: 81.5 fL (ref 80.0–100.0)
Platelets: 300 K/uL (ref 150–400)
RBC: 3.9 MIL/uL (ref 3.87–5.11)
RDW: 13.2 % (ref 11.5–15.5)
WBC: 16.3 K/uL — ABNORMAL HIGH (ref 4.0–10.5)
nRBC: 0 % (ref 0.0–0.2)

## 2023-10-23 LAB — COMPREHENSIVE METABOLIC PANEL WITH GFR
ALT: 30 U/L (ref 0–44)
AST: 28 U/L (ref 15–41)
Albumin: 2.9 g/dL — ABNORMAL LOW (ref 3.5–5.0)
Alkaline Phosphatase: 47 U/L (ref 38–126)
Anion gap: 11 (ref 5–15)
BUN: 10 mg/dL (ref 8–23)
CO2: 23 mmol/L (ref 22–32)
Calcium: 8.2 mg/dL — ABNORMAL LOW (ref 8.9–10.3)
Chloride: 101 mmol/L (ref 98–111)
Creatinine, Ser: 0.7 mg/dL (ref 0.44–1.00)
GFR, Estimated: >60 mL/min (ref >60)
Glucose, Bld: 126 mg/dL — ABNORMAL HIGH (ref 70–99)
Potassium: 4.5 mmol/L (ref 3.5–5.1)
Sodium: 135 mmol/L (ref 135–145)
Total Bilirubin: 0.4 mg/dL (ref 0.0–1.2)
Total Protein: 5.1 g/dL — ABNORMAL LOW (ref 6.5–8.1)

## 2023-10-23 LAB — GLUCOSE, CAPILLARY
Glucose-Capillary: 117 mg/dL — ABNORMAL HIGH (ref 70–99)
Glucose-Capillary: 124 mg/dL — ABNORMAL HIGH (ref 70–99)
Glucose-Capillary: 126 mg/dL — ABNORMAL HIGH (ref 70–99)
Glucose-Capillary: 134 mg/dL — ABNORMAL HIGH (ref 70–99)
Glucose-Capillary: 136 mg/dL — ABNORMAL HIGH (ref 70–99)
Glucose-Capillary: 147 mg/dL — ABNORMAL HIGH (ref 70–99)

## 2023-10-23 SURGERY — COLONOSCOPY
Anesthesia: Choice

## 2023-10-23 MED ORDER — ACETAMINOPHEN 10 MG/ML IV SOLN
1000.0000 mg | Freq: Three times a day (TID) | INTRAVENOUS | Status: DC
  Administered 2023-10-23: 1000 mg via INTRAVENOUS
  Filled 2023-10-23 (×3): qty 100

## 2023-10-23 MED ORDER — LACTATED RINGERS IV SOLN: INTRAVENOUS | Status: DC

## 2023-10-23 MED ORDER — ASPIRIN 81 MG PO CHEW
81.0000 mg | CHEWABLE_TABLET | Freq: Every day | ORAL | Status: DC
  Administered 2023-10-23 – 2023-10-28 (×6): 81 mg via ORAL
  Filled 2023-10-23 (×6): qty 1

## 2023-10-23 MED ORDER — ACETAMINOPHEN 10 MG/ML IV SOLN
1000.0000 mg | Freq: Three times a day (TID) | INTRAVENOUS | Status: AC
  Administered 2023-10-23: 1000 mg via INTRAVENOUS
  Filled 2023-10-23: qty 100

## 2023-10-23 MED ORDER — PANTOPRAZOLE SODIUM 40 MG IV SOLR
40.0000 mg | INTRAVENOUS | Status: DC
  Administered 2023-10-23 – 2023-10-24 (×2): 40 mg via INTRAVENOUS
  Filled 2023-10-23 (×3): qty 10

## 2023-10-23 MED ORDER — HYDROMORPHONE 1 MG/ML IV SOLN
INTRAVENOUS | Status: DC
  Administered 2023-10-25: 2.2 mg via INTRAVENOUS

## 2023-10-23 MED ORDER — PROCHLORPERAZINE EDISYLATE 10 MG/2ML IJ SOLN
10.0000 mg | Freq: Four times a day (QID) | INTRAMUSCULAR | Status: DC | PRN
  Administered 2023-10-23 – 2023-10-25 (×2): 10 mg via INTRAVENOUS
  Filled 2023-10-23 (×3): qty 2

## 2023-10-23 NOTE — Evaluation (Signed)
 Physical Therapy Evaluation Patient Details Name: [name] MRN: [id number] DOB: January 13, 1951 Today's Date: 10/23/2023  History of Present Illness  73 yo female with pancreatic adenocarcinoma of the body, s/p staging laparoscopy, open distal pancreatectomy with en block splenectomy PMH: COPD, IBS, HTN  Clinical Impression  Pt admitted with above. Pt mobilized well for first time up post surgery. Pt with good home set up and reports grandson will take off work to stay with her. Pt functioning at [facility] and anticipate pt to progress well and medical condition improves. Acute PT to cont to follow.        If plan is discharge home, recommend the following: A little help with walking and/or transfers;A little help with bathing/dressing/bathroom;Help with stairs or ramp for entrance   Can travel by private vehicle        Equipment Recommendations Rolling walker (2 wheels)  Recommendations for Other Services       Functional Status Assessment Patient has had a recent decline in their functional status and demonstrates the ability to make significant improvements in function in a reasonable and predictable amount of time.     Precautions / Restrictions Precautions Precautions: Fall Precaution/Restrictions Comments: jp drain in abdomen, NG tube Restrictions Weight Bearing Restrictions Per Provider Order: No      Mobility  Bed Mobility Overal bed mobility: Needs Assistance Bed Mobility: Supine to Sit     Supine to sit: Min assist     General bed mobility comments: increased time, minA to scoot to EOB, used bed rail    Transfers Overall transfer level: Needs assistance Equipment used: 2 person hand held assist Transfers: Sit to/from Stand, Bed to chair/wheelchair/BSC Sit to Stand: Min assist, +2 safety/equipment, +2 physical assistance (line management)   Step pivot transfers: Min assist       General transfer comment: minAx2 via bilat HHA, pt able to march in place x  10 reps prior to onset of feeling nauseated and hot, BP stable, pt returned to chair with LEs elevated    Ambulation/Gait               General Gait Details: limited to step pvt to chair  Stairs            Wheelchair Mobility     Tilt Bed    Modified Rankin (Stroke Patients Only)       Balance Overall balance assessment: Mild deficits observed, not formally tested                                           Pertinent Vitals/Pain Pain Assessment Pain Assessment: Faces Faces Pain Scale: Hurts little more Pain Location: abdomen/surgical site Pain Descriptors / Indicators: Sore Pain Intervention(s):  (has been pressing PCA pump)    Home Living Family/patient expects to be discharged to:: Private residence Living Arrangements: Other relatives Available Help at Discharge: Family;Available PRN/intermittently (lives with grandson who works) Type of Home: House Home Access: Stairs to enter Entrance Stairs-Rails: Can reach Art gallery manager of Steps: 3   Home Layout: One level Home Equipment: None      Prior Function Prior Level of Function : Independent/Modified Independent             Mobility Comments: indep, no AD, drives, grocery shopping ADLs Comments: indep     Extremity/Trunk Assessment   Upper Extremity Assessment Upper Extremity Assessment: Overall [facility]  for tasks assessed    Lower Extremity Assessment Lower Extremity Assessment: Overall WFL for tasks assessed    Cervical / Trunk Assessment Cervical / Trunk Assessment: Other exceptions Cervical / Trunk Exceptions: abdominal surgery  Communication   Communication Communication: No apparent difficulties    Cognition Arousal: Alert (but sleepy) Behavior During Therapy: WFL for tasks assessed/performed   PT - Cognitive impairments: No apparent impairments                       PT - Cognition Comments: A&Ox4, able to follow  commands Following commands: Intact       Cueing Cueing Techniques: Verbal cues     General Comments General comments (skin integrity, edema, etc.): JP drain in abdomen    Exercises     Assessment/Plan    PT Assessment Patient needs continued PT services  PT Problem List Decreased strength;Decreased range of motion;Decreased activity tolerance;Decreased balance;Decreased mobility       PT Treatment Interventions DME instruction;Gait training;Stair training;Functional mobility training;Therapeutic exercise;Therapeutic activities;Balance training    PT Goals (Current goals can be found in the Care Plan section)  Acute Rehab PT Goals Patient Stated Goal: home PT Goal Formulation: With patient Time For Goal Achievement: 11/06/23 Potential to Achieve Goals: Good    Frequency Min 3X/week     Co-evaluation               AM-PAC PT 6 Clicks Mobility  Outcome Measure Help needed turning from your back to your side while in a flat bed without using bedrails?: A Little Help needed moving from lying on your back to sitting on the side of a flat bed without using bedrails?: A Little Help needed moving to and from a bed to a chair (including a wheelchair)?: A Little Help needed standing up from a chair using your arms (e.g., wheelchair or bedside chair)?: A Little Help needed to walk in hospital room?: A Lot Help needed climbing 3-5 steps with a railing? : A Lot 6 Click Score: 16    End of Session Equipment Utilized During Treatment: Oxygen Activity Tolerance: Patient tolerated treatment well Patient left: in chair;with call bell/phone within reach;with chair alarm set Nurse Communication: Mobility status PT Visit Diagnosis: Unsteadiness on feet (R26.81);Muscle weakness (generalized) (M62.81)    Time: 8860-8845 PT Time Calculation (min) (ACUTE ONLY): 15 min   Charges:   PT Evaluation $PT Eval Low Complexity: 1 Low   PT General Charges $$ ACUTE PT VISIT: 1  Visit         Norene Ames, PT, DPT Acute Rehabilitation Services Secure chat preferred Office #: [phone]   Norene CHRISTELLA Ames 10/23/2023, 2:22 PM

## 2023-10-23 NOTE — Plan of Care (Signed)

## 2023-10-23 NOTE — Progress Notes (Addendum)
 1 Day Post-Op  Subjective: No acute issues overnight. Hemodynamically stable. Somewhat sleepy this morning. Endorses nausea.   Objective: Vital signs in last 24 hours: Temp:  [97.4 F (36.3 C)-98.6 F (37 C)] 98.5 F (36.9 C) (08/26 0748) Pulse Rate:  [64-90] 90 (08/26 0700) Resp:  [7-19] 17 (08/26 0738) BP: (103-160)/(43-68) 136/53 (08/26 0700) SpO2:  [91 %-100 %] 92 % (08/26 0738) Arterial Line BP: (142-187)/(37-67) 186/67 (08/25 1545) FiO2 (%):  [28 %] 28 % (08/26 0738) Weight:  [66.1 kg] 66.1 kg (08/25 1706) Last BM Date :  (PTA)  Intake/Output from previous day: 08/25 0701 - 08/26 0700 In: 4492.8 [I.V.:3792.8; NG/GT:200; IV Piggyback:500] Out: 2805 [Urine:1650; Emesis/NG output:300; Drains:355; Blood:500] Intake/Output this shift: No intake/output data recorded.  PE: General: resting comfortably, NAD Neuro: alert and oriented, no focal deficits HEENT: NG in place, nonbilious Resp: normal work of breathing on nasal cannula CV: RRR Abdomen: soft, nondistended, appropriately tender. Upper midline incision is clean and dry, mild ecchymosis but no induration. Extremities: warm and well-perfused GU: foley draining clear yellow urine   Lab Results:  Recent Labs    10/22/23 1221 10/23/23 0442  WBC 18.2* 16.3*  HGB 9.7* 10.2*  HCT 30.9* 31.8*  PLT 250 300   BMET Recent Labs    10/22/23 1221 10/23/23 0309  NA 134* 135  K 4.1 4.5  CL 106 101  CO2 24 23  GLUCOSE 243* 126*  BUN 6* 10  CREATININE 0.69 0.70  CALCIUM  7.2* 8.2*   PT/INR No results for input(s): LABPROT, INR in the last 72 hours. CMP     Component Value Date/Time   NA 135 10/23/2023 0309   NA 140 06/05/2023 1157   K 4.5 10/23/2023 0309   CL 101 10/23/2023 0309   CO2 23 10/23/2023 0309   GLUCOSE 126 (H) 10/23/2023 0309   BUN 10 10/23/2023 0309   BUN 7 (L) 06/05/2023 1157   CREATININE 0.70 10/23/2023 0309   CALCIUM  8.2 (L) 10/23/2023 0309   PROT 5.1 (L) 10/23/2023 0309   PROT  6.2 06/05/2023 1157   ALBUMIN  2.9 (L) 10/23/2023 0309   ALBUMIN  4.3 06/05/2023 1157   AST 28 10/23/2023 0309   ALT 30 10/23/2023 0309   ALKPHOS 47 10/23/2023 0309   BILITOT 0.4 10/23/2023 0309   BILITOT 0.2 06/05/2023 1157   GFRNONAA >60 10/23/2023 0309   GFRAA >60 09/01/2019 1359   Lipase     Component Value Date/Time   LIPASE 61 06/05/2023 1157       Studies/Results: DG Abd Portable 1V Result Date: 10/22/2023 CLINICAL DATA:  NG tube placement. EXAM: PORTABLE ABDOMEN - 1 VIEW COMPARISON:  CT 08/08/2023 FINDINGS: Tip and side port of the enteric tube below the diaphragm in the stomach. Presumed drainage catheter in the left upper quadrant. Nonobstructive upper abdominal bowel gas pattern. IMPRESSION: Tip and side port of the enteric tube below the diaphragm in the stomach. Electronically Signed   By: Andrea Gasman M.D.   On: 10/22/2023 20:02       Assessment/Plan 73 yo female with pancreatic adenocarcinoma, POD1 s/p open distal pancreatectomy with splenectomy. - Continue NG tube to LIS, ok for sips/chips - Pain control: dilaudid  PCA, will decrease dose interval given mild lethargy this morning. Scheduled tylenol  and robaxin .  - Remove foley - Trend LFTs. Normal today, indicating PV remains open (primary repair intra-op with 20% stenosis). Will start aspirin  81mg  daily after discussing with vascular. - Mobilize, PT ordered. - Sliding scale insulin  -  Drain amylase today and POD3 - Post-splenectomy vaccines given preop on 8/4 - FEN: NPO except sips, maintenance IV fluids - VTE: lovenox , SCDs - Dispo: transfer to [facility]    LOS: 1 day    Patty Dawn, MD [facility] Surgery General, Hepatobiliary and Pancreatic Surgery 10/23/23 7:58 AM

## 2023-10-23 NOTE — Plan of Care (Signed)
  Problem: Skin Integrity: Goal: Risk for impaired skin integrity will decrease Outcome: Progressing   Problem: Elimination: Goal: Will not experience complications related to bowel motility Outcome: Progressing   Problem: Pain Managment: Goal: General experience of comfort will improve and/or be controlled Outcome: Progressing

## 2023-10-23 NOTE — TOC CM/SW Note (Signed)
 Transition of Care [facility]) - Inpatient Brief Assessment   Patient Details  Name: [name] MRN: [id number] Date of Birth: Dec 08, 1950  Transition of Care [facility] CM/SW Contact:    Lauraine FORBES Saa, LCSWA Phone Number: 10/23/2023, 9:24 AM   Clinical Narrative:  9:24 AM Per chart review, patient resides at home with relatives. Patient has a PCP and insurance. Patient does not have SNF history. Patient has HH history with [facility] and DME (nebulizer, cane, walker) history. Patient's preferred pharmacy is [facility] [phone]. No TOC needs were identified at this time. TOC will continue to follow and be available to assist.  Transition of Care Asessment: Insurance and Status: Insurance coverage has been reviewed Patient has primary care physician: Yes Home environment has been reviewed: Private Residence Prior level of function:: N/A Prior/Current Home Services: No current home services Social Drivers of Health Review: SDOH reviewed no interventions necessary Readmission risk has been reviewed: Yes (Currently Yellow 21%) Transition of care needs: no transition of care needs at this time

## 2023-10-24 LAB — CBC
HCT: 33.6 % — ABNORMAL LOW (ref 36.0–46.0)
Hemoglobin: 10.4 g/dL — ABNORMAL LOW (ref 12.0–15.0)
MCH: 26.3 pg (ref 26.0–34.0)
MCHC: 31 g/dL (ref 30.0–36.0)
MCV: 84.8 fL (ref 80.0–100.0)
Platelets: 195 K/uL (ref 150–400)
RBC: 3.96 MIL/uL (ref 3.87–5.11)
RDW: 13.3 % (ref 11.5–15.5)
WBC: 18.2 K/uL — ABNORMAL HIGH (ref 4.0–10.5)
nRBC: 0 % (ref 0.0–0.2)

## 2023-10-24 LAB — COMPREHENSIVE METABOLIC PANEL WITH GFR
ALT: 28 U/L (ref 0–44)
AST: 25 U/L (ref 15–41)
Albumin: 3.3 g/dL — ABNORMAL LOW (ref 3.5–5.0)
Alkaline Phosphatase: 59 U/L (ref 38–126)
Anion gap: 7 (ref 5–15)
BUN: 15 mg/dL (ref 8–23)
CO2: 27 mmol/L (ref 22–32)
Calcium: 8.3 mg/dL — ABNORMAL LOW (ref 8.9–10.3)
Chloride: 101 mmol/L (ref 98–111)
Creatinine, Ser: 0.81 mg/dL (ref 0.44–1.00)
GFR, Estimated: >60 mL/min (ref >60)
Glucose, Bld: 119 mg/dL — ABNORMAL HIGH (ref 70–99)
Potassium: 3.9 mmol/L (ref 3.5–5.1)
Sodium: 135 mmol/L (ref 135–145)
Total Bilirubin: 0.5 mg/dL (ref 0.0–1.2)
Total Protein: 5.7 g/dL — ABNORMAL LOW (ref 6.5–8.1)

## 2023-10-24 LAB — GLUCOSE, CAPILLARY
Glucose-Capillary: 105 mg/dL — ABNORMAL HIGH (ref 70–99)
Glucose-Capillary: 145 mg/dL — ABNORMAL HIGH (ref 70–99)
Glucose-Capillary: 151 mg/dL — ABNORMAL HIGH (ref 70–99)
Glucose-Capillary: 167 mg/dL — ABNORMAL HIGH (ref 70–99)
Glucose-Capillary: 118 mg/dL — ABNORMAL HIGH (ref 70–99)

## 2023-10-24 LAB — AMYLASE, BODY FLUID (OTHER): Amylase, Body Fluid: 248 U/L

## 2023-10-24 MED ORDER — ACETAMINOPHEN 500 MG PO TABS
1000.0000 mg | ORAL_TABLET | Freq: Three times a day (TID) | ORAL | Status: DC
  Administered 2023-10-24 – 2023-10-28 (×14): 1000 mg via ORAL
  Filled 2023-10-24 (×14): qty 2

## 2023-10-24 MED ORDER — ALPRAZOLAM 0.5 MG PO TABS
0.5000 mg | ORAL_TABLET | Freq: Two times a day (BID) | ORAL | Status: DC | PRN
  Administered 2023-10-27: 0.5 mg via ORAL
  Filled 2023-10-24: qty 1

## 2023-10-24 NOTE — Progress Notes (Signed)
 2 Days Post-Op  Subjective: Pain adequately controlled. Reports some intermittent nausea, no vomiting. Worked with PT yesterday. Still in ICU, awaiting progressive care bed.   Objective: Vital signs in last 24 hours: Temp:  [97.9 F (36.6 C)-98.9 F (37.2 C)] 98.7 F (37.1 C) (08/27 0334) Pulse Rate:  [71-109] 109 (08/27 0600) Resp:  [4-23] 23 (08/27 0600) BP: (101-147)/(41-58) 147/58 (08/27 0600) SpO2:  [92 %-100 %] 92 % (08/27 0600) FiO2 (%):  [28 %] 28 % (08/26 1605) Last BM Date :  (PTA)  Intake/Output from previous day: 08/26 0701 - 08/27 0700 In: 2043.5 [I.V.:1843.5; IV Piggyback:200] Out: 1515 [Urine:650; Emesis/NG output:750; Drains:115] Intake/Output this shift: No intake/output data recorded.  PE: General: resting comfortably, NAD Neuro: alert and oriented, no focal deficits HEENT: NG in place, minimal bile-tinged drainage Resp: normal work of breathing on nasal cannula CV: mild tachycardia low 100s, regular Abdomen: soft, nondistended, appropriately tender. Upper midline incision is clean and dry, mild ecchymosis but no induration. JP serosanguinous. Extremities: warm and well-perfused   Lab Results:  Recent Labs    10/23/23 0442 10/24/23 0313  WBC 16.3* 18.2*  HGB 10.2* 10.4*  HCT 31.8* 33.6*  PLT 300 195   BMET Recent Labs    10/23/23 0309 10/24/23 0313  NA 135 135  K 4.5 3.9  CL 101 101  CO2 23 27  GLUCOSE 126* 119*  BUN 10 15  CREATININE 0.70 0.81  CALCIUM  8.2* 8.3*   PT/INR No results for input(s): LABPROT, INR in the last 72 hours. CMP     Component Value Date/Time   NA 135 10/24/2023 0313   NA 140 06/05/2023 1157   K 3.9 10/24/2023 0313   CL 101 10/24/2023 0313   CO2 27 10/24/2023 0313   GLUCOSE 119 (H) 10/24/2023 0313   BUN 15 10/24/2023 0313   BUN 7 (L) 06/05/2023 1157   CREATININE 0.81 10/24/2023 0313   CALCIUM  8.3 (L) 10/24/2023 0313   PROT 5.7 (L) 10/24/2023 0313   PROT 6.2 06/05/2023 1157   ALBUMIN  3.3 (L)  10/24/2023 0313   ALBUMIN  4.3 06/05/2023 1157   AST 25 10/24/2023 0313   ALT 28 10/24/2023 0313   ALKPHOS 59 10/24/2023 0313   BILITOT 0.5 10/24/2023 0313   BILITOT 0.2 06/05/2023 1157   GFRNONAA >60 10/24/2023 0313   GFRAA >60 09/01/2019 1359   Lipase     Component Value Date/Time   LIPASE 61 06/05/2023 1157       Studies/Results: DG Abd Portable 1V Result Date: 10/22/2023 CLINICAL DATA:  NG tube placement. EXAM: PORTABLE ABDOMEN - 1 VIEW COMPARISON:  CT 08/08/2023 FINDINGS: Tip and side port of the enteric tube below the diaphragm in the stomach. Presumed drainage catheter in the left upper quadrant. Nonobstructive upper abdominal bowel gas pattern. IMPRESSION: Tip and side port of the enteric tube below the diaphragm in the stomach. Electronically Signed   By: Andrea Gasman M.D.   On: 10/22/2023 20:02       Assessment/Plan 73 yo female with pancreatic adenocarcinoma, POD2 s/p open distal pancreatectomy with splenectomy. - Remove NG tube, advance to clear liquid diet - Pain control: dilaudid  PCA, scheduled tylenol  and robaxin  - LFTs remain normal, indicating PV remains open (primary repair intra-op with 20% stenosis). Aspirin  81mg  daily. - Mobilize, PT ordered. - Sliding scale insulin  - POD1 drain amylase pending. Repeat on POD3. - Post-splenectomy vaccines given preop on 8/4 - FEN: NPO except sips, maintenance IV fluids - VTE: lovenox , SCDs -  Dispo: transfer to med-surg floor    LOS: 2 days    Leonor Dawn, MD [facility] Surgery General, Hepatobiliary and Pancreatic Surgery 10/24/23 7:35 AM

## 2023-10-24 NOTE — Progress Notes (Signed)
 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  Able to stay up in recliner and pulling up to on incentive with noted wheeze when initially getting up.  Improved and pt able to cough with pillow to abdomen though not productive.  PT will continue to follow.     If plan is discharge home, recommend the following: A little help with walking and/or transfers;A little help with bathing/dressing/bathroom;Help with stairs or ramp for entrance   Can travel by private vehicle        Equipment Recommendations  None recommended by PT (stated she has a RW at home)    Recommendations for Other Services       Precautions / Restrictions Precautions Precautions: Fall Precaution/Restrictions Comments: JP drain in abdomen, PCA pump     Mobility  Bed Mobility Overal bed mobility: Needs Assistance Bed Mobility: Supine to Sit     Supine to sit: Supervision, Used rails, HOB elevated     General bed mobility comments: sitting straight up though indicated painful in incision, encouraged use of pillow to splint    Transfers Overall transfer level: Needs assistance Equipment used: Rolling walker (2 wheels) Transfers: Sit to/from Stand Sit to Stand: Contact guard assist           General transfer comment: standing prior to getting lines ready    Ambulation/Gait Ambulation/Gait assistance: Contact guard assist, Min assist Gait Distance (Feet): 60 Feet Assistive device: Rolling walker (2 wheels) Gait Pattern/deviations: Step-to pattern, Decreased stride length, Shuffle        General Gait Details: slow and effortful, encouraged increased distance to specified target; some help to turn walker   Stairs             Wheelchair Mobility     Tilt Bed    Modified Rankin (Stroke Patients Only)       Balance Overall balance assessment: Needs assistance   Sitting balance-Leahy Scale: Good     Standing balance support: During functional activity, Single extremity supported, Bilateral upper extremity supported Standing balance-Leahy Scale: Poor                              Communication Communication Communication: No apparent difficulties  Cognition Arousal: Alert Behavior During Therapy: WFL for tasks assessed/performed   PT - Cognitive impairments: No apparent impairments                         Following commands: Intact      Cueing Cueing Techniques: Verbal cues  Exercises Other Exercises Other Exercises: used incentive spirometer x 5 up to    General Comments General comments (skin integrity, edema, etc.): VSS, though pt in a lot of pain, RN aware, noted L elbow edema and errythema with hot pack so elevated on pillow and refreshed hot pack      Pertinent Vitals/Pain Pain Assessment Pain Assessment: 0-10 Pain Score: 8  Pain Location: abdomen/surgical site Pain Descriptors / Indicators: Discomfort (miserable) Pain Intervention(s): Monitored during session, Repositioned, PCA encouraged    Home Living  Prior Function            PT Goals (current goals can now be found in the care plan section) Progress towards PT goals: Progressing toward goals    Frequency    Min 3X/week      PT Plan      Co-evaluation              AM-PAC PT 6 Clicks Mobility   Outcome Measure  Help needed turning from your back to your side while in a flat bed without using bedrails?: A Little Help needed moving from lying on your back to sitting on the side of a flat bed  without using bedrails?: A Little Help needed moving to and from a bed to a chair (including a wheelchair)?: A Little Help needed standing up from a chair using your arms (e.g., wheelchair or bedside chair)?: A Little Help needed to walk in hospital room?: A Little Help needed climbing 3-5 steps with a railing? : Total 6 Click Score: 16    End of Session   Activity Tolerance: Patient limited by fatigue;Patient limited by pain Patient left: in chair;with call bell/phone within reach Nurse Communication: Mobility status PT Visit Diagnosis: Unsteadiness on feet (R26.81);Muscle weakness (generalized) (M62.81)     Time: 8554-8492 PT Time Calculation (min) (ACUTE ONLY): 22 min  Charges:    $Gait Training: 8-22 mins PT General Charges $$ ACUTE PT VISIT: 1 Visit                     Micheline Portal, PT Acute Rehabilitation Services Office:[phone] 10/24/2023    Montie Portal 10/24/2023, 4:59 PM

## 2023-10-24 NOTE — Plan of Care (Signed)
  Problem: Education: Goal: Knowledge of General Education information will improve Description: Including pain rating scale, medication(s)/side effects and non-pharmacologic comfort measures Outcome: Progressing   Problem: Clinical Measurements: Goal: Ability to maintain clinical measurements within normal limits will improve Outcome: Progressing   Problem: Nutrition: Goal: Adequate nutrition will be maintained Outcome: Progressing   Problem: Pain Managment: Goal: General experience of comfort will improve and/or be controlled Outcome: Progressing

## 2023-10-24 NOTE — Progress Notes (Signed)
 Received patient from unit via wheelchair.  Alert and oriented.  PCA verified with off going nurse Alston)

## 2023-10-25 LAB — COMPREHENSIVE METABOLIC PANEL WITH GFR
ALT: 18 U/L (ref 0–44)
AST: 13 U/L — ABNORMAL LOW (ref 15–41)
Albumin: 2.6 g/dL — ABNORMAL LOW (ref 3.5–5.0)
Alkaline Phosphatase: 52 U/L (ref 38–126)
Anion gap: 9 (ref 5–15)
BUN: 12 mg/dL (ref 8–23)
CO2: 29 mmol/L (ref 22–32)
Calcium: 8 mg/dL — ABNORMAL LOW (ref 8.9–10.3)
Chloride: 100 mmol/L (ref 98–111)
Creatinine, Ser: 0.58 mg/dL (ref 0.44–1.00)
GFR, Estimated: >60 mL/min (ref >60)
Glucose, Bld: 106 mg/dL — ABNORMAL HIGH (ref 70–99)
Potassium: 3.6 mmol/L (ref 3.5–5.1)
Sodium: 138 mmol/L (ref 135–145)
Total Bilirubin: 0.6 mg/dL (ref 0.0–1.2)
Total Protein: 4.9 g/dL — ABNORMAL LOW (ref 6.5–8.1)

## 2023-10-25 LAB — CBC
HCT: 27.7 % — ABNORMAL LOW (ref 36.0–46.0)
Hemoglobin: 8.7 g/dL — ABNORMAL LOW (ref 12.0–15.0)
MCH: 26.1 pg (ref 26.0–34.0)
MCHC: 31.4 g/dL (ref 30.0–36.0)
MCV: 83.2 fL (ref 80.0–100.0)
Platelets: 341 K/uL (ref 150–400)
RBC: 3.33 MIL/uL — ABNORMAL LOW (ref 3.87–5.11)
RDW: 13.3 % (ref 11.5–15.5)
WBC: 21.8 K/uL — ABNORMAL HIGH (ref 4.0–10.5)
nRBC: 0 % (ref 0.0–0.2)

## 2023-10-25 LAB — GLUCOSE, CAPILLARY
Glucose-Capillary: 143 mg/dL — ABNORMAL HIGH (ref 70–99)
Glucose-Capillary: 96 mg/dL (ref 70–99)
Glucose-Capillary: 110 mg/dL — ABNORMAL HIGH (ref 70–99)
Glucose-Capillary: 131 mg/dL — ABNORMAL HIGH (ref 70–99)
Glucose-Capillary: 125 mg/dL — ABNORMAL HIGH (ref 70–99)
Glucose-Capillary: 138 mg/dL — ABNORMAL HIGH (ref 70–99)

## 2023-10-25 MED ORDER — HYDROMORPHONE HCL 1 MG/ML IJ SOLN: 0.5000 mg | INTRAMUSCULAR | Status: DC | PRN

## 2023-10-25 MED ORDER — OXYCODONE HCL 5 MG PO TABS
5.0000 mg | ORAL_TABLET | ORAL | Status: DC | PRN
  Administered 2023-10-25: 5 mg via ORAL
  Administered 2023-10-26 (×2): 10 mg via ORAL
  Administered 2023-10-27: 5 mg via ORAL
  Administered 2023-10-27: 10 mg via ORAL
  Administered 2023-10-27 – 2023-10-28 (×2): 5 mg via ORAL
  Filled 2023-10-25 (×2): qty 2
  Filled 2023-10-25 (×4): qty 1
  Filled 2023-10-25: qty 2

## 2023-10-25 MED ORDER — DOCUSATE SODIUM 100 MG PO CAPS
100.0000 mg | ORAL_CAPSULE | Freq: Two times a day (BID) | ORAL | Status: DC
  Administered 2023-10-25 – 2023-10-28 (×7): 100 mg via ORAL
  Filled 2023-10-25 (×6): qty 1

## 2023-10-25 MED ORDER — PANTOPRAZOLE SODIUM 40 MG PO TBEC
40.0000 mg | DELAYED_RELEASE_TABLET | Freq: Every day | ORAL | Status: DC
  Administered 2023-10-26 – 2023-10-28 (×3): 40 mg via ORAL
  Filled 2023-10-25 (×3): qty 1

## 2023-10-25 NOTE — TOC Initial Note (Signed)
 Transition of Care (TOC) - Initial/Assessment Note   Spoke to patient at bedside.   Patient from home. Her grandson lives with her.   Her phone number is [phone]. The cell that is list on facesheet is her daughter in laws number. Confined address.   Patient has a rolling walker and cane at home.   PT recommending HHPT. Patient has had [facility] in the past. She has no preference.   Artavia with [facility] accepted referral for HHPT.   NCM explained to patient hospital nurse will provide her and family with drain care education prior to discharge.   Patient voiced understanding  Patient Details  Name: Patty [name] MRN: [id number] Date of Birth: [birth year]  Transition of Care [facility]) CM/SW Contact:    Patty Powell Jansky, RN Phone Number: 10/25/2023, 9:33 AM  Clinical Narrative:                   Expected Discharge Plan: Home w Home Health Services Barriers to Discharge: Continued Medical Work up   Patient Goals and CMS Choice Patient states their goals for this hospitalization and ongoing recovery are:: to return to home CMS Medicare.gov Compare Post Acute Care list provided to:: Patient Choice offered to / list presented to : Patient      Expected Discharge Plan and Services   Discharge Planning Services: CM Consult Post Acute Care Choice: Home Health Living arrangements for the past 2 months: Single Family Home                 DME Arranged: N/A DME Agency: NA       HH Arranged: PT HH Agency: [facility] ([facility]) Date HH Agency Contacted: 10/25/23 Time HH Agency Contacted: [phone] Representative spoke with at [facility] Agency: [name]  Prior Living Arrangements/Services Living arrangements for the past 2 months: Single Family Home Lives with:: Other (Comment) (grandson) Patient language and need for interpreter reviewed:: Yes Do you feel safe going back to the place where you live?: Yes      Need for Family Participation in Patient Care: Yes  (Comment) Care giver support system in place?: Yes (comment) Current home services: DME Criminal Activity/Legal Involvement Pertinent to Current Situation/Hospitalization: No - Comment as needed  Activities of Daily Living   ADL Screening (condition at time of admission) Independently performs ADLs?: No Does the patient have a NEW difficulty with bathing/dressing/toileting/self-feeding that is expected to last >3 days?: No Does the patient have a NEW difficulty with getting in/out of bed, walking, or climbing stairs that is expected to last >3 days?: Yes (Initiates electronic notice to provider for possible PT consult) Does the patient have a NEW difficulty with communication that is expected to last >3 days?: No Is the patient deaf or have difficulty hearing?: Yes Does the patient have difficulty seeing, even when wearing glasses/contacts?: No Does the patient have difficulty concentrating, remembering, or making decisions?: No  Permission Sought/Granted   Permission granted to share information with : Yes, Verbal Permission Granted     Permission granted to share info w AGENCY: [facility]        Emotional Assessment Appearance:: Appears stated age Attitude/Demeanor/Rapport: Engaged Affect (typically observed): Appropriate Orientation: : Oriented to Self, Oriented to Place, Oriented to  Time, Oriented to Situation Alcohol / Substance Use: Not Applicable Psych Involvement: No (comment)  Admission diagnosis:  Malignant neoplasm of pancreas, unspecified location of malignancy (HCC) [C25.9] Malignant neoplasm (HCC) [C80.1] Patient Active Problem List   Diagnosis Date Noted  Encounter for Prevnar pneumococcal vaccination 10/01/2023   Normocytic anemia 10/01/2023   Constipation 10/01/2023   Nausea and vomiting 09/18/2023   Pancreatic adenocarcinoma (HCC) 09/17/2023   Pancreatic mass 09/07/2023   Gastritis with hemorrhage 09/07/2023   Abdominal pain, epigastric 08/07/2023    Pancreatic abnormality 08/07/2023   Esophageal dysphagia 06/05/2023   Right sided abdominal pain 06/05/2023   Abnormal CT scan 06/05/2023   Recurrent UTI 06/05/2023   Melena 06/05/2023   Pancreatitis, acute 04/15/2023   Acute exacerbation of chronic obstructive pulmonary disease (COPD) (HCC) 04/14/2023   Hyponatremia 04/14/2023   Hypomagnesemia 04/14/2023   Hypocalcemia 04/14/2023   Iron  deficiency anemia 04/14/2023   Prolonged QT interval 04/14/2023   GERD (gastroesophageal reflux disease) 04/14/2023   Depression 04/14/2023   Acute respiratory failure with hypoxia (HCC) 05/19/2013   Acute bronchitis 05/19/2013   HTN (hypertension) 05/19/2013   PCP:  Patty Dorothey HERO, NP Pharmacy:   [facility] [address], [place] - [address] [place] [address] Phone: [phone] Fax: [phone]     Social Drivers of Health (SDOH) Social History: SDOH Screenings   Food Insecurity: No Food Insecurity (10/23/2023)  Housing: Low Risk  (10/23/2023)  Transportation Needs: No Transportation Needs (10/23/2023)  Utilities: Not At Risk (10/23/2023)  Depression (PHQ2-9): Low Risk  (09/17/2023)  Financial Resource Strain: Low Risk  (07/18/2021)   Received from [facility]  Social Connections: Moderately Integrated (10/23/2023)  Stress: No Stress Concern Present (07/18/2021)   Received from [facility]  Tobacco Use: High Risk (10/22/2023)   SDOH Interventions:     Readmission Risk Interventions     No data to display

## 2023-10-25 NOTE — Care Management Important Message (Signed)
 Important Message  Patient Details  Name: [name] MRN: [id number] Date of Birth: November 23, 1950   Important Message Given:  Yes - Medicare IM     Jon Cruel 10/25/2023, 2:56 PM

## 2023-10-25 NOTE — Plan of Care (Signed)

## 2023-10-25 NOTE — Progress Notes (Signed)
    3 Days Post-Op  Subjective: Pain controlled. Tolerating clear liquids. Denies nausea/vomiting.   Objective: Vital signs in last 24 hours: Temp:  [97.5 F (36.4 C)-98.4 F (36.9 C)] 97.5 F (36.4 C) (08/28 0819) Pulse Rate:  [84-107] 84 (08/28 0819) Resp:  [8-18] 8 (08/28 0837) BP: (108-149)/(42-66) 133/66 (08/28 0819) SpO2:  [90 %-100 %] 97 % (08/28 0819) Last BM Date : 10/22/23  Intake/Output from previous day: 08/27 0701 - 08/28 0700 In: 719.4 [P.O.:180; I.V.:539.4] Out: 195 [Emesis/NG output:100; Drains:95] Intake/Output this shift: No intake/output data recorded.  PE: General: resting comfortably, NAD Neuro: alert and oriented, no focal deficits Resp: normal work of breathing on nasal cannula Abdomen: soft, mildly distended, appropriately tender. Upper midline incision is clean and dry, mild ecchymosis but no induration. JP serosanguinous. Extremities: warm and well-perfused   Lab Results:  Recent Labs    10/24/23 0313 10/25/23 0507  WBC 18.2* 21.8*  HGB 10.4* 8.7*  HCT 33.6* 27.7*  PLT 195 341   BMET Recent Labs    10/24/23 0313 10/25/23 0507  NA 135 138  K 3.9 3.6  CL 101 100  CO2 27 29  GLUCOSE 119* 106*  BUN 15 12  CREATININE 0.81 0.58  CALCIUM  8.3* 8.0*   PT/INR No results for input(s): LABPROT, INR in the last 72 hours. CMP     Component Value Date/Time   NA 138 10/25/2023 0507   NA 140 06/05/2023 1157   K 3.6 10/25/2023 0507   CL 100 10/25/2023 0507   CO2 29 10/25/2023 0507   GLUCOSE 106 (H) 10/25/2023 0507   BUN 12 10/25/2023 0507   BUN 7 (L) 06/05/2023 1157   CREATININE 0.58 10/25/2023 0507   CALCIUM  8.0 (L) 10/25/2023 0507   PROT 4.9 (L) 10/25/2023 0507   PROT 6.2 06/05/2023 1157   ALBUMIN  2.6 (L) 10/25/2023 0507   ALBUMIN  4.3 06/05/2023 1157   AST 13 (L) 10/25/2023 0507   ALT 18 10/25/2023 0507   ALKPHOS 52 10/25/2023 0507   BILITOT 0.6 10/25/2023 0507   BILITOT 0.2 06/05/2023 1157   GFRNONAA >60 10/25/2023 0507    GFRAA >60 09/01/2019 1359   Lipase     Component Value Date/Time   LIPASE 61 06/05/2023 1157       Studies/Results: No results found.      Assessment/Plan 73 yo female with pancreatic adenocarcinoma, POD3 s/p open distal pancreatectomy with splenectomy. - Advance to full liquid diet - Pain control: discontinue PCA, transition to prn oxycodone  and dilaudid , continue scheduled tylenol  and robaxin . - LFTs remain normal, indicating PV remains open (primary repair intra-op with 20% stenosis). Aspirin  81mg  daily. - Mobilize, PT ordered, recommending HHPT. - Sliding scale insulin  - POD1 drain amylase 248. Repeat today. - Post-splenectomy vaccines given preop on 8/4 - FEN: Full liquid diet, SLIV - VTE: lovenox , SCDs - Dispo: inpatient, med-surg floor    LOS: 3 days    Patty Dawn, MD [facility] Surgery General, Hepatobiliary and Pancreatic Surgery 10/25/23 8:55 AM

## 2023-10-25 NOTE — Plan of Care (Signed)
  Problem: Education: Goal: Ability to describe self-care measures that may prevent or decrease complications (Diabetes Survival Skills Education) will improve Outcome: Progressing   Problem: Coping: Goal: Ability to adjust to condition or change in health will improve Outcome: Progressing   Problem: Fluid Volume: Goal: Ability to maintain a balanced intake and output will improve Outcome: Progressing   Problem: Health Behavior/Discharge Planning: Goal: Ability to manage health-related needs will improve Outcome: Progressing   Problem: Metabolic: Goal: Ability to maintain appropriate glucose levels will improve Outcome: Progressing   

## 2023-10-25 NOTE — Progress Notes (Signed)
 Mobility Specialist Progress Note:   10/25/23 0953  Mobility  Activity Ambulated with assistance  Level of Assistance Contact guard assist, steadying assist  Assistive Device Front wheel walker  Distance Ambulated (ft) 10 ft  Activity Response Tolerated well  Mobility Referral Yes  Mobility visit 1 Mobility  Mobility Specialist Start Time (ACUTE ONLY) 0847  Mobility Specialist Stop Time (ACUTE ONLY) 0858  Mobility Specialist Time Calculation (min) (ACUTE ONLY) 11 min   Received pt in bed and agreeable to mobility. Pt deferred further mobility d/t fatigue. Pt ambulated around the bed to the chair. No c/o. Left pt in chair with personal belongings and call light within reach. All needs met.  Lavanda Pollack Mobility Specialist  Please contact via [employer] or  Rehab Office [phone]

## 2023-10-25 NOTE — Progress Notes (Signed)
 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 Currently pt is supervision for bed mobility, sit to stand and gait with RW. Pt required 1x sitting rest break during 150 ft of gait at supervision for safety. No significant increase in pain with activity today. Due to pt current functional status, home set up and available assistance at home recommending skilled physical therapy services 3x/week in order to address strength, balance and functional mobility to decrease risk for falls, injury and re-hospitalization.       If plan is discharge home, recommend the following: A little help with walking and/or transfers;A little help with bathing/dressing/bathroom;Help with stairs or ramp for entrance     Equipment Recommendations  None recommended by PT       Precautions / Restrictions Precautions Precautions: Fall Precaution/Restrictions Comments: JP drain in abdomen, PCA pump Restrictions Weight Bearing Restrictions Per Provider Order: No     Mobility  Bed Mobility Overal bed mobility: Needs Assistance Bed Mobility: Supine to Sit     Supine to sit: Supervision, Used rails, HOB elevated     General bed mobility comments: pt sitting EOB at end of session with bed alarm on. No pain reported with mobility today    Transfers Overall transfer level: Needs assistance Equipment used: Rolling walker (2 wheels) Transfers: Sit to/from Stand Sit to Stand: Supervision           General transfer comment: verbal cues for safe hand placement with sit to stand. pt performed 4x during session for practice with sequencing with carry-over difficulty    Ambulation/Gait Ambulation/Gait  assistance: Supervision Gait Distance (Feet): 150 Feet Assistive device: Rolling walker (2 wheels) Gait Pattern/deviations: Decreased stride length, Shuffle, Step-through pattern Gait velocity: decreased Gait velocity interpretation: 1.31 - 2.62 ft/sec, indicative of limited community ambulator   General Gait Details: very short partial step through gait pattern. Pt was able to improve briefly with cues.     Balance Overall balance assessment: Needs assistance   Sitting balance-Leahy Scale: Good     Standing balance support: During functional activity, Single extremity supported, Bilateral upper extremity supported Standing balance-Leahy Scale: Fair      Hotel manager: No apparent difficulties  Cognition Arousal: Alert Behavior During Therapy: WFL for tasks assessed/performed   PT - Cognitive impairments: No apparent impairments     PT - Cognition Comments: A&Ox4, able to follow commands Following commands: Intact      Cueing Cueing Techniques: Verbal cues     General Comments General comments (skin integrity, edema, etc.): no overt LOB, no signs/symptoms of cardiac/respiratory distress with activity.      Pertinent Vitals/Pain Pain Assessment Pain Assessment: No/denies pain Pain Intervention(s): Monitored during session     PT Goals (current goals can now be found in the care plan section) Acute Rehab PT Goals Patient Stated Goal: home PT Goal Formulation: With patient Time For Goal Achievement: 11/06/23 Potential to Achieve Goals: Good Progress towards PT goals: Progressing toward goals    Frequency    Min 3X/week      PT Plan  Continue with current POC        AM-PAC PT 6 Clicks Mobility   Outcome Measure  Help needed turning from your back  to your side while in a flat bed without using bedrails?: A Little Help needed moving from lying on your back to sitting on the side of a flat bed without using bedrails?: A  Little Help needed moving to and from a bed to a chair (including a wheelchair)?: A Little Help needed standing up from a chair using your arms (e.g., wheelchair or bedside chair)?: A Little Help needed to walk in hospital room?: A Little Help needed climbing 3-5 steps with a railing? : A Little 6 Click Score: 18    End of Session Equipment Utilized During Treatment: Gait belt Activity Tolerance: Patient tolerated treatment well Patient left: in bed;with call bell/phone within reach;with bed alarm set Nurse Communication: Mobility status;Other (comment) (pt sitting EOB with alarm on drinking ginger ale) PT Visit Diagnosis: Unsteadiness on feet (R26.81);Muscle weakness (generalized) (M62.81)     Time: 8557-8542 PT Time Calculation (min) (ACUTE ONLY): 15 min  Charges:    $Therapeutic Activity: 8-22 mins PT General Charges $$ ACUTE PT VISIT: 1 Visit                    Dorothyann Maier, DPT, CLT  Acute Rehabilitation Services Office: [phone] (Secure chat preferred)    Dorothyann VEAR Maier 10/25/2023, 3:09 PM

## 2023-10-26 ENCOUNTER — Ambulatory Visit: Admitting: Urology

## 2023-10-26 LAB — GLUCOSE, CAPILLARY
Glucose-Capillary: 103 mg/dL — ABNORMAL HIGH (ref 70–99)
Glucose-Capillary: 113 mg/dL — ABNORMAL HIGH (ref 70–99)
Glucose-Capillary: 117 mg/dL — ABNORMAL HIGH (ref 70–99)
Glucose-Capillary: 118 mg/dL — ABNORMAL HIGH (ref 70–99)
Glucose-Capillary: 123 mg/dL — ABNORMAL HIGH (ref 70–99)
Glucose-Capillary: 165 mg/dL — ABNORMAL HIGH (ref 70–99)

## 2023-10-26 LAB — HEMOGLOBIN A1C
Hgb A1c MFr Bld: 6.4 % — ABNORMAL HIGH (ref 4.8–5.6)
Mean Plasma Glucose: 137 mg/dL

## 2023-10-26 MED ORDER — POLYETHYLENE GLYCOL 3350 17 G PO PACK
17.0000 g | PACK | Freq: Every day | ORAL | Status: DC
  Administered 2023-10-26 – 2023-10-28 (×3): 17 g via ORAL
  Filled 2023-10-26 (×3): qty 1

## 2023-10-26 MED ORDER — INSULIN ASPART 100 UNIT/ML IJ SOLN
0.0000 [IU] | Freq: Three times a day (TID) | INTRAMUSCULAR | Status: DC
  Administered 2023-10-26 – 2023-10-28 (×3): 3 [IU] via SUBCUTANEOUS

## 2023-10-26 MED ORDER — OXYCODONE HCL 5 MG PO TABS: 5.0000 mg | ORAL_TABLET | Freq: Four times a day (QID) | ORAL | 0 refills | Status: AC | PRN

## 2023-10-26 MED ORDER — METHOCARBAMOL 500 MG PO TABS
500.0000 mg | ORAL_TABLET | Freq: Three times a day (TID) | ORAL | Status: DC
  Administered 2023-10-26 – 2023-10-28 (×7): 500 mg via ORAL
  Filled 2023-10-26 (×7): qty 1

## 2023-10-26 MED ORDER — ACETAMINOPHEN 500 MG PO TABS: 1000.0000 mg | ORAL_TABLET | Freq: Three times a day (TID) | ORAL | Status: AC | PRN

## 2023-10-26 MED ORDER — ASPIRIN 81 MG PO CHEW: 81.0000 mg | CHEWABLE_TABLET | Freq: Every day | ORAL | 0 refills | Status: DC

## 2023-10-26 NOTE — Progress Notes (Signed)
 Mobility Specialist Progress Note:    10/26/23 1059  Mobility  Activity Ambulated with assistance (In hallway)  Level of Assistance Standby assist, set-up cues, supervision of patient - no hands on  Assistive Device Front wheel walker  Distance Ambulated (ft) 150 ft  Activity Response Tolerated well  Mobility Referral Yes  Mobility visit 1 Mobility  Mobility Specialist Start Time (ACUTE ONLY) 1035  Mobility Specialist Stop Time (ACUTE ONLY) 1045  Mobility Specialist Time Calculation (min) (ACUTE ONLY) 10 min   Received pt in bed and agreeable to mobility. No physical assistance needed. C/o abdominal discomfort, otherwise tolerated well. Pt had x1 seated rest breaks. Returned to room without fault. Left pt in chair with personal belongings and call light within reach. All needs met.  Lavanda Pollack Mobility Specialist  Please contact via [employer] or  Rehab Office [phone]

## 2023-10-26 NOTE — Progress Notes (Signed)
    4 Days Post-Op  Subjective: Passing flatus. Tolerating liquids, denies nausea/vomiting.   Objective: Vital signs in last 24 hours: Temp:  [97.5 F (36.4 C)-98.6 F (37 C)] 98.2 F (36.8 C) (08/29 0436) Pulse Rate:  [79-95] 88 (08/29 0436) Resp:  [8-18] 16 (08/29 0436) BP: (127-147)/(47-66) 145/64 (08/29 0436) SpO2:  [93 %-99 %] 98 % (08/29 0436) Last BM Date : 10/22/23  Intake/Output from previous day: 08/28 0701 - 08/29 0700 In: 180 [P.O.:180] Out: 50 [Drains:50] Intake/Output this shift: No intake/output data recorded.  PE: General: resting comfortably, NAD Neuro: alert and oriented, no focal deficits Resp: normal work of breathing on room air Abdomen: soft, minimally distended, nontender. Upper midline incision is clean and dry, mild ecchymosis but no induration. JP serosanguinous. Extremities: warm and well-perfused   Lab Results:  Recent Labs    10/24/23 0313 10/25/23 0507  WBC 18.2* 21.8*  HGB 10.4* 8.7*  HCT 33.6* 27.7*  PLT 195 341   BMET Recent Labs    10/24/23 0313 10/25/23 0507  NA 135 138  K 3.9 3.6  CL 101 100  CO2 27 29  GLUCOSE 119* 106*  BUN 15 12  CREATININE 0.81 0.58  CALCIUM  8.3* 8.0*   PT/INR No results for input(s): LABPROT, INR in the last 72 hours. CMP     Component Value Date/Time   NA 138 10/25/2023 0507   NA 140 06/05/2023 1157   K 3.6 10/25/2023 0507   CL 100 10/25/2023 0507   CO2 29 10/25/2023 0507   GLUCOSE 106 (H) 10/25/2023 0507   BUN 12 10/25/2023 0507   BUN 7 (L) 06/05/2023 1157   CREATININE 0.58 10/25/2023 0507   CALCIUM  8.0 (L) 10/25/2023 0507   PROT 4.9 (L) 10/25/2023 0507   PROT 6.2 06/05/2023 1157   ALBUMIN  2.6 (L) 10/25/2023 0507   ALBUMIN  4.3 06/05/2023 1157   AST 13 (L) 10/25/2023 0507   ALT 18 10/25/2023 0507   ALKPHOS 52 10/25/2023 0507   BILITOT 0.6 10/25/2023 0507   BILITOT 0.2 06/05/2023 1157   GFRNONAA >60 10/25/2023 0507   GFRAA >60 09/01/2019 1359   Lipase     Component  Value Date/Time   LIPASE 61 06/05/2023 1157       Studies/Results: No results found.      Assessment/Plan 73 yo female with pancreatic adenocarcinoma, POD4 s/p open distal pancreatectomy with splenectomy. 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 Postop LFTs remained normal. - Mobilize, PT following, recommending HHPT. - Sliding scale insulin  - POD1 drain amylase 248. POD3 amylase pending. - Post-splenectomy vaccines given preop on 8/4 - FEN: Regular diet - VTE: lovenox , SCDs - Dispo: inpatient, med-surg floor    LOS: 4 days    Leonor Dawn, MD [facility] General, Hepatobiliary and Pancreatic Surgery 10/26/23 6:29 AM

## 2023-10-26 NOTE — Plan of Care (Signed)
  Problem: Education: Goal: Ability to describe self-care measures that may prevent or decrease complications (Diabetes Survival Skills Education) will improve Outcome: Progressing   Problem: Coping: Goal: Ability to adjust to condition or change in health will improve Outcome: Progressing   Problem: Metabolic: Goal: Ability to maintain appropriate glucose levels will improve Outcome: Progressing   Problem: Nutritional: Goal: Maintenance of adequate nutrition will improve Outcome: Progressing   Problem: Skin Integrity: Goal: Risk for impaired skin integrity will decrease Outcome: Progressing   Problem: Tissue Perfusion: Goal: Adequacy of tissue perfusion will improve Outcome: Progressing   Problem: Education: Goal: Knowledge of General Education information will improve Description: Including pain rating scale, medication(s)/side effects and non-pharmacologic comfort measures Outcome: Progressing

## 2023-10-26 NOTE — Discharge Instructions (Addendum)
 [name] DISCHARGE INSTRUCTIONS  Activity No heavy lifting greater than 15 pounds for 8 weeks after surgery. Ok to shower, but do not bathe or submerge incision underwater. Do not drive until cleared by your surgeon. Be sure to walk around your home at least 3 times daily. This will help avoid blood clots and will improve your strength.  Wound Care Your incision is covered with skin glue called Dermabond. This will peel off on its own over time. You may shower and allow warm soapy water to run over your incision. Gently pat dry. Do not submerge your incision underwater for at least 2 weeks. Monitor your incision for any new redness, tenderness, or drainage. Many patients will experience some swelling and bruising at the incisions.  Ice packs will help.  Swelling and bruising can take several days to resolve.   Medications A  prescription for pain medication may be given to you upon discharge.  Take your pain medication as prescribed, if needed.  If narcotic pain medicine is not needed, then you may take acetaminophen  (Tylenol ) as needed. Do not take more than 4000mg  Tylenol  in a single 24-hour period. It is common to experience some constipation if taking pain medication after surgery.  Increasing fluid intake and taking a stool softener (such as Colace) will usually help or prevent this problem from occurring.  A mild laxative (Milk of Magnesia or Miralax ) should be taken according to package directions if there are no bowel movements after 48 hours. Take your usually prescribed medications unless otherwise directed. If you need a refill on your pain medication, please contact your pharmacy.  They will contact our office to request authorization. Prescriptions will not be filled after 5 pm or on weekends. Please take a baby aspirin  (81mg ) once a day for one month following surgery.  Diet You may notice that you feel full early after meals, or have occasional nausea or  decreased appetite. This is common following surgery on the pancreas and will improve with time. It is better to consume small frequent meals throughout the day rather than 3 large meals. Avoid drinking a lot of carbonated beverages, as these can worsen symptoms of nausea and reflux. If you are having frequent vomiting after meals or are unable to keep down any food or drink, please call the office right away.  When to Call Us : Fever greater than 100.5 New redness, drainage, or swelling at incision site Severe pain, nausea, or vomiting Excessive vomiting or inability to keep down any liquids Jaundice (yellowing of the whites of the eyes or skin)  Follow-up You have an appointment scheduled with Dr. Dasie on November 12, 2023 at 1:50pm. This will be at the [facility] office at [address], [address], [place], [place]. Please arrive at least 15 minutes prior to your scheduled appointment time.  IF YOU HAVE DISABILITY OR FAMILY LEAVE FORMS, YOU MUST BRING THEM TO THE OFFICE FOR PROCESSING.   DO NOT GIVE THEM TO YOUR DOCTOR.  The clinic staff is available to answer your questions during regular business hours.  Please don't hesitate to call and ask to speak to one of the nurses for clinical concerns.  If you have a medical emergency, go to the nearest emergency room or call 911.  A surgeon from [facility] is always on call at the hospital  [address], [address], [place], [place]  [address] ?  [address] [address], [place], [place]   [address] (  [phone] ? Toll Free: [phone] ? FAX [phone] Web site: [email or web address]       Managing Your Pain After Surgery Without Opioids    Thank you for participating in our program to help patients manage their pain after surgery without opioids. This is part of our effort to provide you with the best care possible, without exposing you or your family to the risk that opioids pose.  What pain  can I expect after surgery? You can expect to have some pain after surgery. This is normal. The pain is typically worse the day after surgery, and quickly begins to get better. Many studies have found that many patients are able to manage their pain after surgery with Over-the-Counter (OTC) medications such as Tylenol  and Motrin. If you have a condition that does not allow you to take Tylenol  or Motrin, notify your surgical team.  How will I manage my pain? The best strategy for controlling your pain after surgery is around the clock pain control with Tylenol  (acetaminophen ) and Motrin (ibuprofen or Advil). Alternating these medications with each other allows you to maximize your pain control. In addition to Tylenol  and Motrin, you can use heating pads or ice packs on your incisions to help reduce your pain.  How will I alternate your regular strength over-the-counter pain medication? You will take a dose of pain medication every three hours. Start by taking 650 mg of Tylenol  (2 pills of 325 mg) 3 hours later take 600 mg of Motrin (3 pills of 200 mg) 3 hours after taking the Motrin take 650 mg of Tylenol  3 hours after that take 600 mg of Motrin.   - 1 -  See example - if your first dose of Tylenol  is at 12:00 PM   12:00 PM Tylenol  650 mg (2 pills of 325 mg)  3:00 PM Motrin 600 mg (3 pills of 200 mg)  6:00 PM Tylenol  650 mg (2 pills of 325 mg)  9:00 PM Motrin 600 mg (3 pills of 200 mg)  Continue alternating every 3 hours   We recommend that you follow this schedule around-the-clock for at least 3 days after surgery, or until you feel that it is no longer needed. Use the table on the last page of this handout to keep track of the medications you are taking. Important: Do not take more than 3000mg  of Tylenol  or 3200mg  of Motrin in a 24-hour period. Do not take ibuprofen/Motrin if you have a history of bleeding stomach ulcers, severe kidney disease, &/or actively taking a blood  thinner  What if I still have pain? If you have pain that is not controlled with the over-the-counter pain medications (Tylenol  and Motrin or Advil) you might have what we call "breakthrough" pain. You will receive a prescription for a small amount of an opioid pain medication such as Oxycodone , Tramadol , or Tylenol  with Codeine. Use these opioid pills in the first 24 hours after surgery if you have breakthrough pain. Do not take more than 1 pill every 4-6 hours.  If you still have uncontrolled pain after using all opioid pills, don't hesitate to call our staff using the number provided. We will help make sure you are managing your pain in the best way possible, and if necessary, we can provide a prescription for additional pain medication.   Day 1    Time  Name of Medication Number of pills taken  Amount of Acetaminophen   Pain Level   Comments  AM PM  AM PM       AM PM       AM PM       AM PM       AM PM       AM PM       AM PM       Total Daily amount of Acetaminophen  Do not take more than  3,000 mg per day      Day 2    Time  Name of Medication Number of pills taken  Amount of Acetaminophen   Pain Level   Comments  AM PM       AM PM       AM PM       AM PM       AM PM       AM PM       AM PM       AM PM       Total Daily amount of Acetaminophen  Do not take more than  3,000 mg per day      Day 3    Time  Name of Medication Number of pills taken  Amount of Acetaminophen   Pain Level   Comments  AM PM       AM PM       AM PM       AM PM         AM PM       AM PM       AM PM       AM PM       Total Daily amount of Acetaminophen  Do not take more than  3,000 mg per day      Day 4    Time  Name of Medication Number of pills taken  Amount of Acetaminophen   Pain Level   Comments  AM PM       AM PM       AM PM       AM PM       AM PM       AM PM       AM PM       AM PM       Total Daily amount of Acetaminophen  Do not take more than   3,000 mg per day      Day 5    Time  Name of Medication Number of pills taken  Amount of Acetaminophen   Pain Level   Comments  AM PM       AM PM       AM PM       AM PM       AM PM       AM PM       AM PM       AM PM       Total Daily amount of Acetaminophen  Do not take more than  3,000 mg per day      Day 6    Time  Name of Medication Number of pills taken  Amount of Acetaminophen   Pain Level  Comments  AM PM       AM PM       AM PM       AM PM       AM PM       AM PM       AM PM       AM PM  Total Daily amount of Acetaminophen  Do not take more than  3,000 mg per day      Day 7    Time  Name of Medication Number of pills taken  Amount of Acetaminophen   Pain Level   Comments  AM PM       AM PM       AM PM       AM PM       AM PM       AM PM       AM PM       AM PM       Total Daily amount of Acetaminophen  Do not take more than  3,000 mg per day        For additional information about how and where to safely dispose of unused opioid medications - [email or web address]  Disclaimer: This document contains information and/or instructional materials adapted from [facility] for the typical patient with your condition. It does not replace medical advice from your health care provider because your experience may differ from that of the typical patient. Talk to your health care provider if you have any questions about this document, your condition or your treatment plan. Adapted from [facility]

## 2023-10-27 LAB — GLUCOSE, CAPILLARY
Glucose-Capillary: 159 mg/dL — ABNORMAL HIGH (ref 70–99)
Glucose-Capillary: 116 mg/dL — ABNORMAL HIGH (ref 70–99)
Glucose-Capillary: 105 mg/dL — ABNORMAL HIGH (ref 70–99)
Glucose-Capillary: 123 mg/dL — ABNORMAL HIGH (ref 70–99)

## 2023-10-27 MED ORDER — BISACODYL 10 MG RE SUPP
10.0000 mg | Freq: Once | RECTAL | Status: AC
  Administered 2023-10-27: 10 mg via RECTAL
  Filled 2023-10-27: qty 1

## 2023-10-27 NOTE — Progress Notes (Addendum)
 Progress Note  5 Days Post-Op  Subjective: Patient reports continued tenderness around drain site. Denies significant pain. Reports that pain is manageable with medication regimen. She has not had a bowel movement. Reports flatulence. Tolerating regular diet without nausea or vomiting.  ROS  All negative with the exception of above.  Objective: Vital signs in last 24 hours: Temp:  [98.3 F (36.8 C)-99.2 F (37.3 C)] 99.2 F (37.3 C) (08/30 0722) Pulse Rate:  [77-93] 93 (08/30 0722) Resp:  [16] 16 (08/30 0722) BP: (136-146)/(59-64) 136/59 (08/30 0722) SpO2:  [93 %-96 %] 93 % (08/30 0722) Last BM Date : 10/22/23  Intake/Output from previous day: 08/29 0701 - 08/30 0700 In: 240 [P.O.:240] Out: 70 [Drains:70] Intake/Output this shift: No intake/output data recorded.  PE: General: Pleasant female who is sitting in chair at bedside. Lungs: Respiratory effort nonlabored Abd: Soft with mild distention. Tenderness to palpation that is more prominent around drain site. Midline incision is C/D/I. No rebound tenderness or guarding. JP drain with <25 serosanguinous fluid. Psych: A&Ox3 with an appropriate affect.    Lab Results:  Recent Labs    10/25/23 0507  WBC 21.8*  HGB 8.7*  HCT 27.7*  PLT 341   BMET Recent Labs    10/25/23 0507  NA 138  K 3.6  CL 100  CO2 29  GLUCOSE 106*  BUN 12  CREATININE 0.58  CALCIUM  8.0*   PT/INR No results for input(s): LABPROT, INR in the last 72 hours. CMP     Component Value Date/Time   NA 138 10/25/2023 0507   NA 140 06/05/2023 1157   K 3.6 10/25/2023 0507   CL 100 10/25/2023 0507   CO2 29 10/25/2023 0507   GLUCOSE 106 (H) 10/25/2023 0507   BUN 12 10/25/2023 0507   BUN 7 (L) 06/05/2023 1157   CREATININE 0.58 10/25/2023 0507   CALCIUM  8.0 (L) 10/25/2023 0507   PROT 4.9 (L) 10/25/2023 0507   PROT 6.2 06/05/2023 1157   ALBUMIN  2.6 (L) 10/25/2023 0507   ALBUMIN  4.3 06/05/2023 1157   AST 13 (L) 10/25/2023 0507   ALT  18 10/25/2023 0507   ALKPHOS 52 10/25/2023 0507   BILITOT 0.6 10/25/2023 0507   BILITOT 0.2 06/05/2023 1157   GFRNONAA >60 10/25/2023 0507   GFRAA >60 09/01/2019 1359   Lipase     Component Value Date/Time   LIPASE 61 06/05/2023 1157       Studies/Results: No results found.  Anti-infectives: Anti-infectives (From admission, onward)    Start     Dose/Rate Route Frequency Ordered Stop   10/22/23 0600  ceFAZolin  (ANCEF ) IVPB 2g/100 mL premix       Placed in And Linked Group   2 g 200 mL/hr over 30 Minutes Intravenous On call to O.R. 10/22/23 0550 10/22/23 0758   10/22/23 0600  metroNIDAZOLE  (FLAGYL ) IVPB 500 mg       Placed in And Linked Group   500 mg 100 mL/hr over 60 Minutes Intravenous On call to O.R. 10/22/23 0550 10/22/23 0758        Assessment/Plan 73 yo female with pancreatic adenocarcinoma, POD5 s/p open distal pancreatectomy with splenectomy. - Temp 99.2. Hemodynamically stable. - Tolerating regular diet. No bowel movement. Having flatulence. Provided suppository to promote bowel movement. 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 Postop LFTs remained normal. - Continue to mobilize as  tolerated, PT following, HHPT was recommended. - Sliding scale insulin  - POD1 drain amylase 248. POD3 amylase still pending. - Post-splenectomy vaccines given preop on 8/4   FEN: Regular diet VTE: lovenox , SCDs Dispo: inpatient, med-surg floor      LOS: 5 days   I reviewed nursing notes, last 24 h vitals and pain scores, last 48 h intake and output, last 24 h labs and trends, and last 24 h imaging results.   Marjorie Carlyon Favre, [facility] 10/27/2023, 10:35 AM Please see Amion for pager number during day hours 7:00am-4:30pm

## 2023-10-27 NOTE — Plan of Care (Signed)

## 2023-10-27 NOTE — Inpatient Diabetes Management (Signed)
 Inpatient Diabetes Program Recommendations  AACE/ADA: New Consensus Statement on Inpatient Glycemic Control (2015)  Target Ranges:  Prepandial:   less than 140 mg/dL      Peak postprandial:   less than 180 mg/dL (1-2 hours)      Critically ill patients:  140 - 180 mg/dL    Latest Reference Range & Units 10/26/23 07:26  Hemoglobin A1C 4.8 - 5.6 % 6.4 (H)  (H): Data is abnormally high  Latest Reference Range & Units 10/26/23 08:29 10/26/23 11:32 10/26/23 16:22 10/26/23 21:17  Glucose-Capillary 70 - 99 mg/dL 881 (H) 834 (H)  3 units Novolog   103 (H) 123 (H)  (H): Data is abnormally high   Latest Reference Range & Units 10/27/23 07:57  Glucose-Capillary 70 - 99 mg/dL 840 (H)  3 units Novolog    (H): Data is abnormally high    Admit with: Pancreatic adenocarcinoma, POD4 s/p open distal pancreatectomy with splenectomy.   No History of Diabetes  Current Orders: Novolog  Moderate Correction Scale/ SSI (0-15 units) TID AC    Only required Novolog  SSI once yest (3 units at Lunch)  CBGs well controlled with minimal meds    For Discharge: Recommend we give pt a Rx for Glucometer and supplies and have her check her CBGs TID prior to meals for the next month If CBGs remain controlled (80-130 pre-meals), reduce CBG checks to Once daily If CBGs become >200 at home, needs to call PCP    --Will follow patient during hospitalization--  Adina Rudolpho Arrow RN, MSN, CDCES Diabetes Coordinator Inpatient Glycemic Control Team Team Pager: [phone] (8a-5p)

## 2023-10-27 NOTE — Plan of Care (Signed)
  Problem: Coping: Goal: Ability to adjust to condition or change in health will improve Outcome: Progressing   Problem: Fluid Volume: Goal: Ability to maintain a balanced intake and output will improve Outcome: Progressing   Problem: Health Behavior/Discharge Planning: Goal: Ability to manage health-related needs will improve Outcome: Progressing   Problem: Metabolic: Goal: Ability to maintain appropriate glucose levels will improve Outcome: Progressing   Problem: Pain Managment: Goal: General experience of comfort will improve and/or be controlled Outcome: Progressing

## 2023-10-28 LAB — AMYLASE, BODY FLUID (OTHER): Amylase, Body Fluid: 181 U/L

## 2023-10-28 LAB — GLUCOSE, CAPILLARY
Glucose-Capillary: 185 mg/dL — ABNORMAL HIGH (ref 70–99)
Glucose-Capillary: 77 mg/dL (ref 70–99)

## 2023-10-28 NOTE — Progress Notes (Signed)
 Progress Note  6 Days Post-Op  Subjective: Patient reports bowel movement. Reports flatulence. Tolerating regular diet without nausea and vomiting. Pain is manageable at this time.  ROS  All negative with the exception of above.  Objective: Vital signs in last 24 hours: Temp:  [98.1 F (36.7 C)-98.6 F (37 C)] 98.4 F (36.9 C) (08/31 0839) Pulse Rate:  [80-91] 87 (08/31 0839) Resp:  [16-18] 18 (08/31 0839) BP: (128-150)/(55-64) 128/55 (08/31 0839) SpO2:  [94 %-96 %] 94 % (08/31 0839) Last BM Date : 10/27/23  Intake/Output from previous day: No intake/output data recorded. Intake/Output this shift: No intake/output data recorded.  PE: General: Pleasant female who is sitting in chair at bedside. Lungs: Respiratory effort nonlabored Abd: Soft with mild distention. Tenderness to palpation that is more prominent around drain site. Midline incision is C/D/I. No rebound tenderness or guarding. JP drain with <25 serosanguinous fluid. Psych: A&Ox3 with an appropriate affect.    Lab Results:  No results for input(s): WBC, HGB, HCT, PLT in the last 72 hours. BMET No results for input(s): NA, K, CL, CO2, GLUCOSE, BUN, CREATININE, CALCIUM  in the last 72 hours. PT/INR No results for input(s): LABPROT, INR in the last 72 hours. CMP     Component Value Date/Time   NA 138 10/25/2023 0507   NA 140 06/05/2023 1157   K 3.6 10/25/2023 0507   CL 100 10/25/2023 0507   CO2 29 10/25/2023 0507   GLUCOSE 106 (H) 10/25/2023 0507   BUN 12 10/25/2023 0507   BUN 7 (L) 06/05/2023 1157   CREATININE 0.58 10/25/2023 0507   CALCIUM  8.0 (L) 10/25/2023 0507   PROT 4.9 (L) 10/25/2023 0507   PROT 6.2 06/05/2023 1157   ALBUMIN  2.6 (L) 10/25/2023 0507   ALBUMIN  4.3 06/05/2023 1157   AST 13 (L) 10/25/2023 0507   ALT 18 10/25/2023 0507   ALKPHOS 52 10/25/2023 0507   BILITOT 0.6 10/25/2023 0507   BILITOT 0.2 06/05/2023 1157   GFRNONAA >60 10/25/2023 0507   GFRAA >60  09/01/2019 1359   Lipase     Component Value Date/Time   LIPASE 61 06/05/2023 1157       Studies/Results: No results found.  Anti-infectives: Anti-infectives (From admission, onward)    Start     Dose/Rate Route Frequency Ordered Stop   10/22/23 0600  ceFAZolin  (ANCEF ) IVPB 2g/100 mL premix       Placed in And Linked Group   2 g 200 mL/hr over 30 Minutes Intravenous On call to O.R. 10/22/23 0550 10/22/23 0758   10/22/23 0600  metroNIDAZOLE  (FLAGYL ) IVPB 500 mg       Placed in And Linked Group   500 mg 100 mL/hr over 60 Minutes Intravenous On call to O.R. 10/22/23 0550 10/22/23 0758        Assessment/Plan 73 yo female with pancreatic adenocarcinoma, POD5 s/p open distal pancreatectomy with splenectomy. - Afebrile. Hemodynamically stable. - Tolerating regular diet. Had bowel movement. Having flatulence.  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 Postop LFTs remained normal. - Continue to mobilize as tolerated, PT following, HHPT was recommended. - Sliding scale insulin  - POD1 drain amylase 248. POD3 amylase still pending. - Post-splenectomy vaccines given preop on 8/4     FEN: Regular diet VTE: lovenox , SCDs Dispo: inpatient, med-surg floor    LOS: 6 days   I reviewed specialist notes,  nursing notes, last 24 h vitals and pain scores, last 48 h intake and output, last 24 h labs and trends, and last 24 h imaging results.  Marjorie Carlyon Favre, [facility] 10/28/2023, 9:53 AM Please see Amion for pager number during day hours 7:00am-4:30pm

## 2023-10-28 NOTE — TOC Transition Note (Signed)
 Transition of Care [facility]) - Discharge Note   Patient Details  Name: [name] MRN: [id number] Date of Birth: 03-26-1950  Transition of Care [facility]) CM/SW Contact:  Robynn Eileen Hoose, RN Phone Number: 10/28/2023, 2:13 PM   Clinical Narrative:  Patient is being discharged today. Artavia with [facility] made aware.     Final next level of care: Home w Home Health Services Barriers to Discharge: No Barriers Identified   Patient Goals and CMS Choice Patient states their goals for this hospitalization and ongoing recovery are:: to return to home CMS Medicare.gov Compare Post Acute Care list provided to:: Patient Choice offered to / list presented to : Patient      Discharge Placement                       Discharge Plan and Services Additional resources added to the After Visit Summary for     Discharge Planning Services: CM Consult Post Acute Care Choice: Home Health          DME Arranged: N/A DME Agency: NA       HH Arranged: PT HH Agency: [facility] Date HH Agency Contacted: 10/25/23 Time HH Agency Contacted: [phone] Representative spoke with at [facility] Agency: [name]  Social Drivers of Health (SDOH) Interventions SDOH Screenings   Food Insecurity: No Food Insecurity (10/23/2023)  Housing: Low Risk  (10/23/2023)  Transportation Needs: No Transportation Needs (10/23/2023)  Utilities: Not At Risk (10/23/2023)  Depression (PHQ2-9): Low Risk  (09/17/2023)  Financial Resource Strain: Low Risk  (07/18/2021)   Received from [facility]  Social Connections: Moderately Integrated (10/23/2023)  Stress: No Stress Concern Present (07/18/2021)   Received from [facility]  Tobacco Use: High Risk (10/22/2023)     Readmission Risk Interventions     No data to display

## 2023-10-28 NOTE — Plan of Care (Signed)
  Problem: Education: Goal: Ability to describe self-care measures that may prevent or decrease complications (Diabetes Survival Skills Education) will improve Outcome: Progressing   Problem: Coping: Goal: Ability to adjust to condition or change in health will improve Outcome: Progressing   Problem: Nutritional: Goal: Maintenance of adequate nutrition will improve Outcome: Progressing   Problem: Skin Integrity: Goal: Risk for impaired skin integrity will decrease Outcome: Progressing   Problem: Nutrition: Goal: Adequate nutrition will be maintained Outcome: Progressing   Problem: Coping: Goal: Level of anxiety will decrease Outcome: Progressing

## 2023-10-28 NOTE — Plan of Care (Signed)

## 2023-10-29 LAB — TYPE AND SCREEN
ABO/RH(D): O POS
Antibody Screen: NEGATIVE
Unit division: 0
Unit division: 0

## 2023-10-29 LAB — BPAM RBC
Blood Product Expiration Date: 202509062359
Blood Product Expiration Date: 202509172359
ISSUE DATE / TIME: 202508250721
ISSUE DATE / TIME: 202508250721
Unit Type and Rh: 5100
Unit Type and Rh: 5100

## 2023-10-31 ENCOUNTER — Other Ambulatory Visit (HOSPITAL_COMMUNITY): Payer: Self-pay | Admitting: Surgery

## 2023-10-31 ENCOUNTER — Ambulatory Visit (HOSPITAL_COMMUNITY)
Admission: RE | Admit: 2023-10-31 | Discharge: 2023-10-31 | Disposition: A | Discharge status: Home / Self Care | Source: Ambulatory Visit | Attending: Vascular Surgery | Admitting: Vascular Surgery

## 2023-10-31 DIAGNOSIS — R600 Localized edema: Secondary | ICD-10-CM | POA: Diagnosis not present

## 2023-11-01 ENCOUNTER — Inpatient Hospital Stay: Admitting: Oncology

## 2023-11-01 ENCOUNTER — Other Ambulatory Visit

## 2023-11-01 ENCOUNTER — Other Ambulatory Visit: Payer: Self-pay | Admitting: Oncology

## 2023-11-01 NOTE — Discharge Summary (Signed)
 [facility] Discharge Summary   Patient ID: [name] MRN: [id number] DOB/AGE: 05-01-1950 73 y.o.  Admit date: 10/22/2023 Discharge date: 10/28/2023  Admitting Diagnosis: Pancreatic cancer Abdominal pain Constipation  Discharge Diagnosis Pancreatic cancer  Consultants None  Imaging: DG Abd Portable 1 V 10/22/2023 CLINICAL DATA:  NG tube placement. EXAM: PORTABLE ABDOMEN - 1 VIEW COMPARISON:  CT 08/08/2023 FINDINGS: Tip and side port of the enteric tube below the diaphragm in the stomach. Presumed drainage catheter in the left upper quadrant. Nonobstructive upper abdominal bowel gas pattern. IMPRESSION: Tip and side port of the enteric tube below the diaphragm in the stomach. Electronically Signed   By: Andrea Gasman M.D.   On: 10/22/2023 20:02  Procedures Dr. Leonor Dawn (10/22/23): Staging laparoscopy Open distal pancreatectomy with en block splenectomy Intraoperative pancreatic ultrasound  Hospital Course:  [name] is a 73 year old female who presented to the hospital with pancreatic adenocarcinoma. Patient was admitted and underwent procedure listed above (on 8/25). Tolerated procedure well and was transferred to the floor. Post-splenectomy vaccines were given 8/4. NG tube kept in place 8/26. NGT removed 8/27. Diet was advanced as tolerated. Foley catheter removed on 8/26. LFTs were trended throughout post-operative course. LUQ JP drain was placed during surgery and monitored throughout post-operative course. Quality and quantity continued to downtrend, but drain was left in place at discharge. Patient participated in physical therapy post-operatively. Patient did not require equipment at discharge. Home health PT was arranged for her. POD3 amylase was pending at discharge. On POD6, the patient was voiding well, tolerating diet, mobilizing, pain well controlled, vital signs stable, incisions c/d/i and felt stable for discharge home.  Patient  scheduled to follow up with Dr. Dawn. She will call to confirm appointment time. Precautions provided at discharge.  Physical Exam: General: Pleasant female who is sitting in chair at bedside. Lungs: Respiratory effort nonlabored Abd: Soft with mild distention. Tenderness to palpation that is more prominent around drain site. Midline incision is C/D/I. No rebound tenderness or guarding. JP drain with <25 serosanguinous fluid. Psych: A&Ox3 with an appropriate affect.   I or a member of my team have reviewed this patient in the Controlled Substance Database.   Allergies as of 10/28/2023       Reactions   Codeine Nausea And Vomiting   Egg-derived Products Other (See Comments)        Medication List     STOP taking these medications    oxyCODONE -acetaminophen  5-325 MG tablet Commonly known as: PERCOCET/ROXICET       TAKE these medications    acetaminophen  500 MG tablet Commonly known as: TYLENOL  Take 2 tablets (1,000 mg total) by mouth every 8 (eight) hours as needed for mild pain (pain score 1-3).   albuterol  108 (90 Base) MCG/ACT inhaler Commonly known as: VENTOLIN  HFA Inhale 2 puffs into the lungs every 6 (six) hours as needed for wheezing or shortness of breath.   ALPRAZolam  0.5 MG tablet Commonly known as: XANAX  Take 0.5 mg by mouth 2 (two) times daily.   aspirin  81 MG chewable tablet Chew 1 tablet (81 mg total) by mouth daily.   Breztri  Aerosphere 160-9-4.8 MCG/ACT Aero inhaler Generic drug: budesonide -glycopyrrolate -formoterol  Inhale 2 puffs into the lungs 2 (two) times daily.   escitalopram  20 MG tablet Commonly known as: LEXAPRO  Take 20 mg by mouth daily.   ketoconazole  2 % cream Commonly known as: NIZORAL  Apply to affected area (external use only) twice a day, continue at least 2-3 days  after rash resolves   lisinopril -hydrochlorothiazide  20-12.5 MG tablet Commonly known as: ZESTORETIC  Take 1 tablet by mouth daily.   nitrofurantoin  50 MG  capsule Commonly known as: Macrodantin  Take 1 capsule (50 mg total) by mouth at bedtime.   omeprazole  40 MG capsule Commonly known as: PRILOSEC Take 1 capsule (40 mg total) by mouth 2 (two) times daily before a meal.   oxyCODONE  5 MG immediate release tablet Commonly known as: Oxy IR/ROXICODONE  Take 1 tablet (5 mg total) by mouth every 6 (six) hours as needed for severe pain (pain score 7-10).   polyethylene glycol 17 g packet Commonly known as: MiraLax  Take 17 g by mouth daily.   prochlorperazine  10 MG tablet Commonly known as: COMPAZINE  Take 1 tablet (10 mg total) by mouth every 6 (six) hours as needed for nausea or vomiting.   Vraylar  1.5 MG capsule Generic drug: cariprazine  Take 1.5 mg by mouth daily.          Follow-up Information     [facility]  Follow up.   Contact information: [address] [place] [address] [phone]         Patty Leonor CROME, MD. Go on 11/12/2023.   Specialty: General Surgery Why: 1:50PM; Please arrive 30 minutes prior to your appointment Contact information: [address] [place] [address] [phone]                 Signed: Marjorie Carlyon Edmundo DEVONNA [facility] 11/01/2023, 11:07 AM Please see Amion for pager number during day hours 7:00am-4:30pm

## 2023-11-01 NOTE — Progress Notes (Deleted)
 Patient Care Team: Renato Dorothey HERO, NP as PCP - General (Internal Medicine) Davonna Siad, MD as Medical Oncologist (Medical Oncology) Celestia Joesph SQUIBB, RN as Oncology Nurse Navigator (Medical Oncology)  Clinic Day:  11/01/2023  Referring physician: Renato Dorothey HERO, NP   CHIEF COMPLAINT:  CC: Pancreatic adenocarcinoma    ASSESSMENT & PLAN:   Assessment & Plan: [name]  is a 73 y.o. female with Pancreatic adenocarcinoma   Assessment & Plan     The patient understands the plans discussed today and is in agreement with them.  She knows to contact our office if she develops concerns prior to her next appointment.  I provided 30 minutes of face-to-face time during this encounter and > 50% was spent counseling as documented under my assessment and plan.    Siad Davonna, MD  [facility] [place] - A DEPT OF [facility] [address] [place] [address] Dept: [phone] Dept Fax: [phone]   No orders of the defined types were placed in this encounter.    ONCOLOGY HISTORY:   Oncology History  Pancreatic adenocarcinoma (HCC)  08/08/2023 Imaging   CT abdomen and pelvis with and without contrast:  IMPRESSION: 1. 2.1 cm hypoenhancing mass within the mid body of the pancreas resulting in mild dilation of the main pancreatic duct within the tail the pancreas and asymmetric pancreatic parenchymal atrophy within this region. These findings are suspicious for a pancreatic adenocarcinoma. Endoscopic ultrasound and biopsy is advised for further evaluation. 2. No evidence regional adenopathy or distant metastatic disease within the abdomen and pelvis. 3. Small hiatal hernia. 4. Status post umbilical hernia repair with mesh.     09/06/2023 Pathology Results   A. PANCREATIC, BODY LESION, FINE NEEDLE ASPIRATION   FINAL MICROSCOPIC DIAGNOSIS:  - Adenocarcinoma    09/17/2023 Initial Diagnosis    Pancreatic adenocarcinoma (HCC)   09/17/2023 Cancer Staging   Staging form: Exocrine Pancreas, AJCC 8th Edition - Clinical stage from 09/17/2023: Stage IB (cT2, cN0, cM0) - Signed by Davonna Siad, MD on 09/18/2023 Stage prefix: Initial diagnosis Total positive nodes: 0   09/17/2023 Tumor Marker   CA 19-9: 23- Normal   09/23/2023 Imaging   CT chest w contrast:  IMPRESSION: 1. Scattered tiny pulmonary nodules measuring up to 2 mm, nonspecific but favored benign. Consider short-term interval follow-up dedicated chest CT. 2. No convincing evidence of metastatic disease in the chest. 3. Known pancreatic lesion better assessed on CT pancreas protocol of the abdomen August 08, 2023. 4. Aortic atherosclerosis.       Current Treatment: Plan for pancreatectomy and splenectomy  INTERVAL HISTORY:   [name] is here today for follow up. Patient is accompanied by her son today.She experiences significant lower back pain, which she describes as 'pretty bad' on some days. She takes pain medication twice daily, in the morning and at bedtime, which has resulted in constipation.   She experiences nausea and vomiting, particularly when her bowels do not move. She recalls being prescribed Zofran  for nausea but did not obtain it due to cost. Despite these symptoms, she is able to eat.  She has no other complaints today.  She was seen by Dr. Dasie since we last met and is scheduled for pancreatectomy and splenectomy on 10/22/2023.  She will be getting her splenectomy vaccinations today.  We discussed the CT scan findings in detail and that she does not have any evidence of lung disease at this  time.  I have reviewed the past medical history, past surgical history, social history and family history with the patient and they are unchanged from previous note.  ALLERGIES:  is allergic to codeine and egg-derived products.  MEDICATIONS:  Current Outpatient Medications  Medication Sig Dispense Refill    acetaminophen  (TYLENOL ) 500 MG tablet Take 2 tablets (1,000 mg total) by mouth every 8 (eight) hours as needed for mild pain (pain score 1-3).     albuterol  (PROVENTIL  HFA;VENTOLIN  HFA) 108 (90 BASE) MCG/ACT inhaler Inhale 2 puffs into the lungs every 6 (six) hours as needed for wheezing or shortness of breath.     ALPRAZolam  (XANAX ) 0.5 MG tablet Take 0.5 mg by mouth 2 (two) times daily.     aspirin  81 MG chewable tablet Chew 1 tablet (81 mg total) by mouth daily. 30 tablet 0   BREZTRI  AEROSPHERE 160-9-4.8 MCG/ACT AERO Inhale 2 puffs into the lungs 2 (two) times daily.     cariprazine  (VRAYLAR ) 1.5 MG capsule Take 1.5 mg by mouth daily.     escitalopram  (LEXAPRO ) 20 MG tablet Take 20 mg by mouth daily.     ketoconazole  (NIZORAL ) 2 % cream Apply to affected area (external use only) twice a day, continue at least 2-3 days after rash resolves (Patient not taking: Reported on 10/11/2023) 30 g 0   lisinopril -hydrochlorothiazide  (ZESTORETIC ) 20-12.5 MG tablet Take 1 tablet by mouth daily.     nitrofurantoin  (MACRODANTIN ) 50 MG capsule Take 1 capsule (50 mg total) by mouth at bedtime. (Patient not taking: Reported on 10/11/2023) 30 capsule 11   omeprazole  (PRILOSEC) 40 MG capsule Take 1 capsule (40 mg total) by mouth 2 (two) times daily before a meal. 60 capsule 6   oxyCODONE  (OXY IR/ROXICODONE ) 5 MG immediate release tablet Take 1 tablet (5 mg total) by mouth every 6 (six) hours as needed for severe pain (pain score 7-10). 15 tablet 0   PHILLIPS STOOL SOFTENER 100 MG capsule Take 1 capsule by mouth twice daily 10 capsule 0   polyethylene glycol (MIRALAX ) 17 g packet Take 17 g by mouth daily. (Patient not taking: Reported on 10/11/2023) 30 each 11   prochlorperazine  (COMPAZINE ) 10 MG tablet Take 1 tablet (10 mg total) by mouth every 6 (six) hours as needed for nausea or vomiting. 30 tablet 0   No current facility-administered medications for this visit.    REVIEW OF SYSTEMS:   Constitutional: Denies  fevers, chills or abnormal weight loss Eyes: Denies blurriness of vision Ears, nose, mouth, throat, and face: Denies mucositis or sore throat Respiratory: Denies cough, dyspnea or wheezes Cardiovascular: Denies palpitation, chest discomfort or lower extremity swelling Gastrointestinal:  Denies nausea, heartburn or change in bowel habits Skin: Denies abnormal skin rashes Lymphatics: Denies new lymphadenopathy or easy bruising Neurological:Denies numbness, tingling or new weaknesses Behavioral/Psych: Mood is stable, no new changes  All other systems were reviewed with the patient and are negative.   VITALS:  There were no vitals taken for this visit.  Wt Readings from Last 3 Encounters:  10/22/23 145 lb 11.6 oz (66.1 kg)  10/17/23 139 lb 4.8 oz (63.2 kg)  09/17/23 143 lb 4.8 oz (65 kg)    There is no height or weight on file to calculate BMI.  Performance status (ECOG): 0 - Asymptomatic  PHYSICAL EXAM:   GENERAL:alert, no distress and comfortable SKIN: skin color, texture, turgor are normal, no rashes or significant lesions LUNGS: clear to auscultation and percussion with normal breathing effort HEART: regular rate &  rhythm and no murmurs and no lower extremity edema ABDOMEN:abdomen soft, non-tender and normal bowel sounds Musculoskeletal:no cyanosis of digits and no clubbing  NEURO: alert & oriented x 3 with fluent speech, no focal motor/sensory deficits  LABORATORY DATA:  I have reviewed the data as listed    Component Value Date/Time   NA 138 10/25/2023 0507   NA 140 06/05/2023 1157   K 3.6 10/25/2023 0507   CL 100 10/25/2023 0507   CO2 29 10/25/2023 0507   GLUCOSE 106 (H) 10/25/2023 0507   BUN 12 10/25/2023 0507   BUN 7 (L) 06/05/2023 1157   CREATININE 0.58 10/25/2023 0507   CALCIUM  8.0 (L) 10/25/2023 0507   PROT 4.9 (L) 10/25/2023 0507   PROT 6.2 06/05/2023 1157   ALBUMIN  2.6 (L) 10/25/2023 0507   ALBUMIN  4.3 06/05/2023 1157   AST 13 (L) 10/25/2023 0507   ALT  18 10/25/2023 0507   ALKPHOS 52 10/25/2023 0507   BILITOT 0.6 10/25/2023 0507   BILITOT 0.2 06/05/2023 1157   GFRNONAA >60 10/25/2023 0507   GFRAA >60 09/01/2019 1359    Lab Results  Component Value Date   WBC 21.8 (H) 10/25/2023   NEUTROABS 2.5 09/17/2023   HGB 8.7 (L) 10/25/2023   HCT 27.7 (L) 10/25/2023   MCV 83.2 10/25/2023   PLT 341 10/25/2023      Chemistry      Component Value Date/Time   NA 138 10/25/2023 0507   NA 140 06/05/2023 1157   K 3.6 10/25/2023 0507   CL 100 10/25/2023 0507   CO2 29 10/25/2023 0507   BUN 12 10/25/2023 0507   BUN 7 (L) 06/05/2023 1157   CREATININE 0.58 10/25/2023 0507      Component Value Date/Time   CALCIUM  8.0 (L) 10/25/2023 0507   ALKPHOS 52 10/25/2023 0507   AST 13 (L) 10/25/2023 0507   ALT 18 10/25/2023 0507   BILITOT 0.6 10/25/2023 0507   BILITOT 0.2 06/05/2023 1157      Latest Reference Range & Units 09/17/23 11:37  CA 19-9 0 - 35 U/mL 23    RADIOGRAPHIC STUDIES: I have personally reviewed the radiological images as listed and agreed with the findings in the report.

## 2023-11-02 ENCOUNTER — Inpatient Hospital Stay: Admitting: Oncology

## 2023-11-02 ENCOUNTER — Other Ambulatory Visit: Payer: Self-pay

## 2023-11-02 ENCOUNTER — Other Ambulatory Visit

## 2023-11-02 MED ORDER — FUROSEMIDE 20 MG PO TABS: 20.0000 mg | ORAL_TABLET | Freq: Every day | ORAL | 0 refills | Status: AC

## 2023-11-02 NOTE — Telephone Encounter (Signed)
 Patient reports BLE edema, order received from Dr. Davonna for the patient to take  Lasix  20mg  daily for 5 days. Rx sent. VM left making patient aware.

## 2023-11-05 LAB — SURGICAL PATHOLOGY

## 2023-11-07 NOTE — Progress Notes (Signed)
 PROVIDER:  SHELBY LYNN ALLEN, MD  MRN: [id number] DOB: 1950-05-09 DATE OF ENCOUNTER: 11/07/2023 Interval History:     [name] presents for follow up after undergoing distal pancreatectomy with splenectomy, and portal vein repair on 8/25. She was discharged home on 8/31 with her drain in place. POD1 and 3 drain amylases were 248 and 181, respectively. Last week she called the office with leg swelling, and duplex US  was negative for DVT. She was started on Lasix  by oncology, which has helped with the swelling. Today she is overall feeling well. She is having regular formed bowel movements and denies loose or oily stools. She has noticed an odor to her urine and pain with urination. She is eating without difficulty. She reports her drain output has been minimal but has not been recording it.  Surgical Pathology: A. PANCREATIC NECK MARGIN: - Benign pancreatic parenchyma.  B. PANCREATIC DUCT MARGIN: - Benign pancreatic parenchyma.  C. NEW PANCREATIC NECK MARGIN: - Benign pancreatic parenchyma.  D. LYMPH NODE, STATION 8, EXCISION: - One lymph node, negative for carcinoma (0/1).  E. PANCREAS, DISTAL AND SPLEEN, PANCREATECTOMY: - Adenocarcinoma, moderately differentiated, at least 1.1 cm (see comment and synoptic report). - Perineural invasion present. - Metastatic adenocarcinoma in one of eleven lymph nodes (1/11). - Spleen with no specific pathologic change.  F. OMENTUM, NODULE, EXCISION: - Calcified spindle cell lesion consistent with gastrointestinal stromal tumor, 2.2 cm (see comment).  Comment (E): Evaluation of the main pancreatic specimen is limited due to severe processing artifact.  Representative slides were seen in peer review on 10/31/2023 by Dr. Reed, who concurs with the diagnosis of pancreatic adenocarcinoma.  Comment (F): Sections of the omental lesion demonstrate a hyalinizing and calcifying spindle cell tumor.  Immunohistochemical stains are performed  and demonstrate the tumor to be diffusely positive for C-kit, while being negative for CK AE1/AE3.  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  Mitotic rate is low.  Primary omental GISTs have been reported.  Clinical and radiographic correlation is recommended.  CASE SUMMARY: PANCREAS (EXOCRINE) Standard(s): AJCC-UICC 8  SPECIMEN Procedure: Partial pancreatectomy, pancreatic body and tail  TUMOR Tumor Site: Pancreatic body Histologic Type: Ductal adenocarcinoma Histologic Grade: G2, moderately differentiated Tumor Size: Greatest dimension: Cannot be determined: Adenocarcinoma admixed with chronic pancreatitis, evaluation limited by processing artifact. Site(s) Involved by Direct Tumor Extension: Peripancreatic soft tissues -splenic vein near the confluence with the portal vein (per operative note) Treatment Effect: No known presurgical therapy Lymphovascular Invasion: Not identified Perineural Invasion: Present  MARGINS Margin Status for Invasive Carcinoma: All margins negative for invasive carcinoma Margin Status for Dysplasia and Intraepithelial Neoplasia: All margins negative for high-grade dysplasia and/or high-grade intraepithelial neoplasia  REGIONAL LYMPH NODES Regional Lymph Node Status: Regional lymph nodes present - Tumor present in regional lymph nodes -- Number of lymph nodes with tumor: 1 -- Number of lymph nodes examined: 12     Physical Examination:   Physical Exam Vitals reviewed.  Constitutional:      General: She is not in acute distress.    Appearance: Normal appearance.  Pulmonary:     Effort: Pulmonary effort is normal. No respiratory distress.  Abdominal:     General: There is no distension.     Palpations: Abdomen is soft.     Tenderness: There is no abdominal tenderness.     Comments: Upper midline incision is clean and dry with no erythema, induration or drainage. LUQ JP has scant milky fluid in bulb.   Skin:  General: Skin is warm and dry.     Coloration: Skin is not jaundiced.  Neurological:     General: No focal deficit present.     Mental Status: She is alert and oriented to person, place, and time.         Assessment and Plan:     [name] is a 73 y.o. female who underwent open distal pancreatectomy with splenectomy on 10/22/23. She is overall recovering well. Her drain was serous in the hospital but now has a milky color consistent with a pancreatic leak. She was instructed to measure the daily output and keep a log, and will follow up next week. Volume appears to be low, and the leak is controlled with the drain currently.   Will send a UA and culture given her urinary symptoms. I will call her with these results.  Hgb A1c was 6.4 in the hospital, and I discussed with the patient that this is consistent with prediabetes. Hyperglycemia may worsen given pancreatic resection. I recommended she see her PCP in the next few weeks and will forward my note.  She does not currently have any signs or symptoms of exocrine pancreatic insufficiency, but will continue to monitor for this.  I reviewed her path results, including the incidental GIST that was found in the omentum. It is low grade, and she will have surveillance with her pancreatic cancer. Will ensure she has follow up with her oncologist Dr. Davonna to discuss chemotherapy for her pancreatic cancer. I am happy to place a port if needed, or patient may have this done in [place] as it would likely be more convenient for her.  I will see her back next week for another drain check. She was encouraged to call in the meantime with any further questions or concerns.  Diagnoses and all orders for this visit:  Pancreatic adenocarcinoma (CMS/HHS-HCC)  History of partial pancreatectomy  Encounter for follow-up examination  Dysuria -     Urinalysis, Complete - [facility] -     Urine Culture, Routine - [facility]         Return in about 1 week (around 11/14/2023).   The plan was discussed in detail with the patient today, who expressed understanding.  The patient has my contact information, and understands to call me with any additional questions or concerns in the interval.  I would be happy to see the patient back sooner if the need arises.   SHELBY LYNN ALLEN, MD

## 2023-11-23 ENCOUNTER — Other Ambulatory Visit: Payer: Self-pay

## 2023-11-25 NOTE — Progress Notes (Signed)
 Patient Care Team: Patty Dorothey HERO, NP as PCP - General (Internal Medicine) Patty Siad, MD as Medical Oncologist (Medical Oncology) Patty Joesph SQUIBB, RN as Oncology Nurse Navigator (Medical Oncology)  Clinic Day:  11/26/2023  Referring physician: Renato Dorothey HERO, NP   CHIEF COMPLAINT:  CC: Pancreatic adenocarcinoma    ASSESSMENT & PLAN:   Assessment & Plan: [name]  is a 73 y.o. 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  Pathology consistent with pancreatic adenocarcinoma, moderately differentiated, all margins negative, direct tumor extension into splenic vein near the confluence with the portal vein.  So size of the tumor could not be currently assessed because of chronic pancreatitis on the sample. 1 lymph node positive (1/11) Genetic Screening: Pending CA 19-9 normal  - Patient with confirmed at least stage IIb pancreatic adenocarcinoma.  Discussed the diagnosis and prognosis in detail. - The rest the need of adjuvant chemotherapy for decreasing the recurrence of disease. - Considering patient's age and comorbidities and somewhat limited functional status, I think gemcitabine with Abraxane is better tolerated than modified FOLFIRINOX. -The major side effects to include myelosuppression (notably neutropenia and thrombocytopenia), peripheral neuropathy (numbness, tingling, or pain in hands/feet), and substantial fatigue. Other frequent effects are gastrointestinal (nausea, vomiting, diarrhea), alopecia, rash, decreased appetite, dehydration, and in some cases more serious issues like infection/sepsis or hepatotoxicity. - This regimen has a median overall survival of 41.8 months which is improved compared to gemcitabine alone.  Plan chemotherapy for 6 months - Will consider port placement for easier chemotherapy administration for  labs.  Risk versus benefits of port placement were discussed in detail including the risks of infection, bleeding and thrombosis.  Will place an IR consult. - Will obtain baseline labs today - Will obtain a baseline CT CAP prior to start of chemotherapy.  Will repeat in 3 months after starting chemotherapy. - Will schedule for chemo education  Return to clinic prior to second cycle of chemotherapy for assessment of tolerance Normocytic anemia Patient has normocytic anemia.  Likely contributed by iron  deficiency that was evident on prior labs  - Will obtain iron  panel and ferritin today.  Will replace as needed. Acute cystitis without hematuria Reports multiple episodes of UTI requiring antibiotics.  - Will obtain UA today Encounter for Prevnar pneumococcal vaccination Since the patient is planned to undergo splenectomy, will administer the following vaccines today Pneumococcal vaccine Prevnar 20 , Hib vaccine and  2 meningococcal vaccines  - Will administer PPSVV 23 today and every 5 years after that - Meningococcal vaccine booster every 5 years - Yearly influenza vaccine Drug-induced constipation Patient reported improvement with stool softeners  - Continue prescribed Colace twice daily Nausea and vomiting, unspecified vomiting type Resolved at this time Right sided abdominal pain Resolved at this time GIST (gastrointestinal stroma tumor), malignant, colon (HCC) Incidental diagnosis on omental nodule biopsy Low-grade and low risk with low mitotic rate.  - Will observe at this time - Will send Caris testing for KIT mutation assessment    The patient understands the plans discussed today and is in agreement with them.  She knows to contact our office if she develops concerns prior to her next appointment.  I provided 40 minutes of face-to-face time during this encounter and > 50% was spent counseling as documented under my assessment and plan.    [name],acting as a  Neurosurgeon for [name] Davonna, MD.,have documented all relevant documentation  on the behalf of Mickiel Dry, MD,as directed by  Mickiel Dry, MD while in the presence of Mickiel Dry, MD.  I, Mickiel Dry MD, have reviewed the above documentation for accuracy and completeness, and I agree with the above.     Mickiel Dry, MD  [facility] [place] - A DEPT OF [facility] [address] [place] [address] Dept: [phone] Dept Fax: [phone]   Orders Placed This Encounter  Procedures   IR IMAGING GUIDED PORT INSERTION    Standing Status:   Future    Expiration Date:   11/25/2024    Reason for Exam (SYMPTOM  OR DIAGNOSIS REQUIRED):   Chemotherapy for pancreatic cancer    Preferred Imaging Location?:   [facility]   CT CHEST ABDOMEN PELVIS W CONTRAST    Standing Status:   Future    Expected Date:   11/26/2023    Expiration Date:   11/25/2024    If indicated for the ordered procedure, I authorize the administration of contrast media per Radiology protocol:   Yes    Does the patient have a contrast media/X-ray dye allergy?:   No    Preferred imaging location?:   [facility]    If indicated for the ordered procedure, I authorize the administration of oral contrast media per Radiology protocol:   Yes   CBC with Differential/Platelet    Standing Status:   Future    Number of Occurrences:   1    Expected Date:   11/26/2023    Expiration Date:   02/24/2024   Comprehensive metabolic panel with GFR    Standing Status:   Future    Number of Occurrences:   1    Expected Date:   11/26/2023    Expiration Date:   02/24/2024   Ferritin    Standing Status:   Future    Number of Occurrences:   1    Expected Date:   11/26/2023    Expiration Date:   02/24/2024   Folate    Standing Status:   Future    Number of Occurrences:   1    Expected Date:   11/26/2023    Expiration Date:   02/24/2024   Vitamin B12     Standing Status:   Future    Number of Occurrences:   1    Expected Date:   11/26/2023    Expiration Date:   02/24/2024   Iron  and TIBC    Standing Status:   Future    Number of Occurrences:   1    Expected Date:   11/26/2023    Expiration Date:   02/24/2024   Cancer antigen 19-9    Standing Status:   Future    Number of Occurrences:   1    Expected Date:   11/26/2023    Expiration Date:   02/24/2024   CEA    Standing Status:   Future    Number of Occurrences:   1    Expected Date:   11/26/2023    Expiration Date:   02/24/2024   Urinalysis, Complete w Microscopic    Standing Status:   Future    Number of Occurrences:   1    Expected Date:   11/26/2023    Expiration Date:   11/25/2024     ONCOLOGY HISTORY:   Diagnosis: Pancreatic adenocarcinoma:  -08/08/2023: CT CAP:2.1 cm hypoenhancing mass within the  mid body of the pancreas resulting in mild dilation of the main pancreatic duct within the tail the pancreas and asymmetric pancreatic parenchymal atrophy within this region. No evidence regional adenopathy or distant metastatic disease within the abdomen and pelvis.  - 09/06/2023: EUS with biopsy:  -A round lesion was identified in the pancreatic body. The lesion was hypoechoic. The lesion measured 14 mm by 13 mm in maximal cross- sectional diameter. -Pancreatic body lesion, FNA: Adenocarcinoma - 09/17/2023: CA 19 9: 23-normal - 09/23/2023: CT chest:Scattered tiny pulmonary nodules measuring up to 2 mm, nonspecific but favored benign. No convincing evidence of metastatic disease in the chest.  -10/22/2023: Pancreatectomy and splenectomy.  -Pathology: Spleen and distal portion of pancreas showed moderately differentiated adenocarcinoma, at least 1.1 cm. Perineural invasion present. Metastatic adenocarcinoma in one of eleven lymph nodes (1/11). Pancreatic neck and duct margins were benign.  -Tumor size could not be determined, adenocarcinoma admixed with chronic pancreatitis - Involved  by direct tumor extension to peripancreatic soft tissues-splenic vein near the confluence with the portal vein -Omentum nodule positive for calcified spindle cell lesion consistent with gastrointestinal stromal tumor, 2.2 cm. Tumor is diffusely positive for C-kit, while being negative for CK AE1/AE3.    GIST:  -10/22/23: Omentum nodule biopsy: Omentum nodule positive for calcified spindle cell lesion consistent with gastrointestinal stromal tumor, 2.2 cm. Tumor is diffusely positive for C-kit, while being negative for CK AE1/AE3. Low mitotic rate.    Current Treatment: Plan for adjuvant Gemcitabine + Abraxane  INTERVAL HISTORY:   [name] is here today for follow up. Patient is accompanied by her son today.  Patient stated that she has recovered from surgery well and has her drain removed.  She has some minimal pain at the drain site and some erythema.  She experiences significant fatigue, spending more than half of her day resting. She attempts to stay active by walking to the mailbox but needs frequent rest due to fatigue.  She has a history of recurrent urinary tract infections that return shortly after completing each course of antibiotics.  She experiences constipation every two to three days, with improvement noted with stool softeners. No current nausea or vomiting, which were previously managed with medication.  She is not currently taking any pain medication, including Percocet, which she only uses when absolutely necessary.  I have reviewed the past medical history, past surgical history, social history and family history with the patient and they are unchanged from previous note.  ALLERGIES:  is allergic to codeine and egg-derived products.  MEDICATIONS:  Current Outpatient Medications  Medication Sig Dispense Refill   acetaminophen  (TYLENOL ) 500 MG tablet Take 2 tablets (1,000 mg total) by mouth every 8 (eight) hours as needed for mild pain (pain score 1-3).      albuterol  (PROVENTIL  HFA;VENTOLIN  HFA) 108 (90 BASE) MCG/ACT inhaler Inhale 2 puffs into the lungs every 6 (six) hours as needed for wheezing or shortness of breath.     ALPRAZolam  (XANAX ) 0.5 MG tablet Take 0.5 mg by mouth 2 (two) times daily.     aspirin  81 MG chewable tablet Chew 1 tablet (81 mg total) by mouth daily. 30 tablet 0   BREZTRI  AEROSPHERE 160-9-4.8 MCG/ACT AERO Inhale 2 puffs into the lungs 2 (two) times daily.     cariprazine  (VRAYLAR ) 1.5 MG capsule Take 1.5 mg by mouth daily.     escitalopram  (LEXAPRO ) 10 MG tablet Take 10 mg by mouth daily.     lisinopril -hydrochlorothiazide  (ZESTORETIC ) 20-12.5 MG tablet  Take 1 tablet by mouth daily.     nitrofurantoin , macrocrystal-monohydrate, (MACROBID ) 100 MG capsule Take 100 mg by mouth 2 (two) times daily.     omeprazole  (PRILOSEC) 40 MG capsule Take 1 capsule (40 mg total) by mouth 2 (two) times daily before a meal. 60 capsule 6   oxyCODONE  (OXY IR/ROXICODONE ) 5 MG immediate release tablet Take 1 tablet (5 mg total) by mouth every 6 (six) hours as needed for severe pain (pain score 7-10). 15 tablet 0   PHILLIPS STOOL SOFTENER 100 MG capsule Take 1 capsule by mouth twice daily 10 capsule 0   prochlorperazine  (COMPAZINE ) 10 MG tablet Take 1 tablet (10 mg total) by mouth every 6 (six) hours as needed for nausea or vomiting. 30 tablet 0   furosemide  (LASIX ) 20 MG tablet Take 1 tablet (20 mg total) by mouth daily. (Patient not taking: Reported on 11/26/2023) 5 tablet 0   ketoconazole  (NIZORAL ) 2 % cream Apply to affected area (external use only) twice a day, continue at least 2-3 days after rash resolves (Patient not taking: Reported on 11/26/2023) 30 g 0   polyethylene glycol (MIRALAX ) 17 g packet Take 17 g by mouth daily. (Patient not taking: Reported on 11/26/2023) 30 each 11   No current facility-administered medications for this visit.   Facility-Administered Medications Ordered in Other Visits  Medication Dose Route Frequency Provider Last  Rate Last Admin   meningococcal B (BEXSERO ) injection 0.5 mL  0.5 mL Intramuscular Once Ariea Rochin, MD       meningococcal oligosaccharide (MENVEO) injection 0.5 mL  0.5 mL Intramuscular Once Patty Siad, MD        REVIEW OF SYSTEMS:   Constitutional: Denies fevers, chills or abnormal weight loss Eyes: Denies blurriness of vision Ears, nose, mouth, throat, and face: Denies mucositis or sore throat Respiratory: Denies cough, dyspnea or wheezes Cardiovascular: Denies palpitation, chest discomfort or lower extremity swelling Gastrointestinal:  Denies nausea, heartburn or change in bowel habits Skin: Denies abnormal skin rashes Lymphatics: Denies new lymphadenopathy or easy bruising Neurological:Denies numbness, tingling or new weaknesses Behavioral/Psych: Mood is stable, no new changes  All other systems were reviewed with the patient and are negative.   VITALS:  Blood pressure (!) 104/47, pulse 74, temperature 98.1 F (36.7 C), temperature source Oral, resp. rate 16, weight 133 lb 6.1 oz (60.5 kg), SpO2 98%.  Wt Readings from Last 3 Encounters:  11/26/23 133 lb 6.1 oz (60.5 kg)  10/22/23 145 lb 11.6 oz (66.1 kg)  10/17/23 139 lb 4.8 oz (63.2 kg)    Body mass index is 26.05 kg/m.  Performance status (ECOG): 0 - Asymptomatic  PHYSICAL EXAM:   GENERAL:alert, no distress and comfortable SKIN: skin color, texture, turgor are normal, no rashes or significant lesions LUNGS: clear to auscultation and percussion with normal breathing effort HEART: regular rate & rhythm and no murmurs and no lower extremity edema ABDOMEN: Drain site with some erythema and tenderness.  Normal bowel sounds.  No distention. Musculoskeletal:no cyanosis of digits and no clubbing  NEURO: alert & oriented x 3 with fluent speech, no focal motor/sensory deficits  LABORATORY DATA:  I have reviewed the data as listed    Component Value Date/Time   NA 138 10/25/2023 0507   NA 140 06/05/2023 1157    K 3.6 10/25/2023 0507   CL 100 10/25/2023 0507   CO2 29 10/25/2023 0507   GLUCOSE 106 (H) 10/25/2023 0507   BUN 12 10/25/2023 0507   BUN 7 (L) 06/05/2023  1157   CREATININE 0.58 10/25/2023 0507   CALCIUM  8.0 (L) 10/25/2023 0507   PROT 4.9 (L) 10/25/2023 0507   PROT 6.2 06/05/2023 1157   ALBUMIN  2.6 (L) 10/25/2023 0507   ALBUMIN  4.3 06/05/2023 1157   AST 13 (L) 10/25/2023 0507   ALT 18 10/25/2023 0507   ALKPHOS 52 10/25/2023 0507   BILITOT 0.6 10/25/2023 0507   BILITOT 0.2 06/05/2023 1157   GFRNONAA >60 10/25/2023 0507   GFRAA >60 09/01/2019 1359    Lab Results  Component Value Date   WBC 21.8 (H) 10/25/2023   NEUTROABS 2.5 09/17/2023   HGB 8.7 (L) 10/25/2023   HCT 27.7 (L) 10/25/2023   MCV 83.2 10/25/2023   PLT 341 10/25/2023      Chemistry      Component Value Date/Time   NA 138 10/25/2023 0507   NA 140 06/05/2023 1157   K 3.6 10/25/2023 0507   CL 100 10/25/2023 0507   CO2 29 10/25/2023 0507   BUN 12 10/25/2023 0507   BUN 7 (L) 06/05/2023 1157   CREATININE 0.58 10/25/2023 0507      Component Value Date/Time   CALCIUM  8.0 (L) 10/25/2023 0507   ALKPHOS 52 10/25/2023 0507   AST 13 (L) 10/25/2023 0507   ALT 18 10/25/2023 0507   BILITOT 0.6 10/25/2023 0507   BILITOT 0.2 06/05/2023 1157      Latest Reference Range & Units 09/17/23 11:37  CA 19-9 0 - 35 U/mL 23    RADIOGRAPHIC STUDIES: I have personally reviewed the radiological images as listed and agreed with the findings in the report.

## 2023-11-26 ENCOUNTER — Inpatient Hospital Stay

## 2023-11-26 ENCOUNTER — Inpatient Hospital Stay: Attending: Oncology | Admitting: Oncology

## 2023-11-26 VITALS — BP 104/47 | HR 74 | Temp 98.1°F | Resp 16 | Wt 133.4 lb

## 2023-11-26 DIAGNOSIS — N3000 Acute cystitis without hematuria: Secondary | ICD-10-CM | POA: Insufficient documentation

## 2023-11-26 DIAGNOSIS — C259 Malignant neoplasm of pancreas, unspecified: Secondary | ICD-10-CM

## 2023-11-26 DIAGNOSIS — C49A0 Gastrointestinal stromal tumor, unspecified site: Secondary | ICD-10-CM | POA: Diagnosis not present

## 2023-11-26 DIAGNOSIS — C251 Malignant neoplasm of body of pancreas: Secondary | ICD-10-CM | POA: Diagnosis present

## 2023-11-26 DIAGNOSIS — Z79899 Other long term (current) drug therapy: Secondary | ICD-10-CM | POA: Diagnosis not present

## 2023-11-26 DIAGNOSIS — C49A4 Gastrointestinal stromal tumor of large intestine: Secondary | ICD-10-CM | POA: Insufficient documentation

## 2023-11-26 DIAGNOSIS — Z23 Encounter for immunization: Secondary | ICD-10-CM | POA: Diagnosis not present

## 2023-11-26 DIAGNOSIS — Z7982 Long term (current) use of aspirin: Secondary | ICD-10-CM | POA: Insufficient documentation

## 2023-11-26 DIAGNOSIS — K5903 Drug induced constipation: Secondary | ICD-10-CM | POA: Diagnosis not present

## 2023-11-26 DIAGNOSIS — R112 Nausea with vomiting, unspecified: Secondary | ICD-10-CM

## 2023-11-26 DIAGNOSIS — D649 Anemia, unspecified: Secondary | ICD-10-CM | POA: Diagnosis not present

## 2023-11-26 DIAGNOSIS — R109 Unspecified abdominal pain: Secondary | ICD-10-CM

## 2023-11-26 LAB — CBC WITH DIFFERENTIAL/PLATELET
Abs Immature Granulocytes: 0.04 K/uL (ref 0.00–0.07)
Basophils Absolute: 0.2 K/uL — ABNORMAL HIGH (ref 0.0–0.1)
Basophils Relative: 2 %
Eosinophils Absolute: 0.5 K/uL (ref 0.0–0.5)
Eosinophils Relative: 5 %
HCT: 28.1 % — ABNORMAL LOW (ref 36.0–46.0)
Hemoglobin: 8.2 g/dL — ABNORMAL LOW (ref 12.0–15.0)
Immature Granulocytes: 0 %
Lymphocytes Relative: 24 %
Lymphs Abs: 2.6 K/uL (ref 0.7–4.0)
MCH: 22.6 pg — ABNORMAL LOW (ref 26.0–34.0)
MCHC: 29.2 g/dL — ABNORMAL LOW (ref 30.0–36.0)
MCV: 77.4 fL — ABNORMAL LOW (ref 80.0–100.0)
Monocytes Absolute: 1 K/uL (ref 0.1–1.0)
Monocytes Relative: 9 %
Neutro Abs: 6.3 K/uL (ref 1.7–7.7)
Neutrophils Relative %: 60 %
Platelets: 684 K/uL — ABNORMAL HIGH (ref 150–400)
RBC: 3.63 MIL/uL — ABNORMAL LOW (ref 3.87–5.11)
RDW: 16.4 % — ABNORMAL HIGH (ref 11.5–15.5)
WBC: 10.5 K/uL (ref 4.0–10.5)
nRBC: 0 % (ref 0.0–0.2)

## 2023-11-26 LAB — IRON AND TIBC
Iron: <10 ug/dL — ABNORMAL LOW (ref 28–170)
TIBC: 491 ug/dL — ABNORMAL HIGH (ref 250–450)

## 2023-11-26 LAB — URINALYSIS, COMPLETE (UACMP) WITH MICROSCOPIC
Bilirubin Urine: NEGATIVE
Glucose, UA: NEGATIVE mg/dL
Hgb urine dipstick: NEGATIVE
Ketones, ur: NEGATIVE mg/dL
Nitrite: POSITIVE — AB
Protein, ur: NEGATIVE mg/dL
Specific Gravity, Urine: 1.012 (ref 1.005–1.030)
WBC, UA: >50 WBC/hpf (ref 0–5)
pH: 7 (ref 5.0–8.0)

## 2023-11-26 LAB — COMPREHENSIVE METABOLIC PANEL WITH GFR
ALT: 11 U/L (ref 0–44)
AST: 14 U/L — ABNORMAL LOW (ref 15–41)
Albumin: 3.7 g/dL (ref 3.5–5.0)
Alkaline Phosphatase: 83 U/L (ref 38–126)
Anion gap: 10 (ref 5–15)
BUN: 10 mg/dL (ref 8–23)
CO2: 26 mmol/L (ref 22–32)
Calcium: 8.7 mg/dL — ABNORMAL LOW (ref 8.9–10.3)
Chloride: 96 mmol/L — ABNORMAL LOW (ref 98–111)
Creatinine, Ser: 0.67 mg/dL (ref 0.44–1.00)
GFR, Estimated: >60 mL/min (ref >60)
Glucose, Bld: 141 mg/dL — ABNORMAL HIGH (ref 70–99)
Potassium: 4 mmol/L (ref 3.5–5.1)
Sodium: 132 mmol/L — ABNORMAL LOW (ref 135–145)
Total Bilirubin: 0.5 mg/dL (ref 0.0–1.2)
Total Protein: 6.8 g/dL (ref 6.5–8.1)

## 2023-11-26 LAB — FERRITIN: Ferritin: 10 ng/mL — ABNORMAL LOW (ref 11–307)

## 2023-11-26 LAB — FOLATE: Folate: 13.1 ng/mL (ref >5.9)

## 2023-11-26 LAB — VITAMIN B12: Vitamin B-12: 320 pg/mL (ref 180–914)

## 2023-11-26 MED ORDER — MENINGOCOCCAL VAC B (OMV) IM SUSY
0.5000 mL | PREFILLED_SYRINGE | Freq: Once | INTRAMUSCULAR | Status: AC
  Administered 2023-11-26: 0.5 mL via INTRAMUSCULAR
  Filled 2023-11-26: qty 0.5

## 2023-11-26 MED ORDER — MENINGOCOCCAL A C Y&W-135 OLIG IM SOLN
0.5000 mL | Freq: Once | INTRAMUSCULAR | Status: AC
  Administered 2023-11-26: 0.5 mL via INTRAMUSCULAR
  Filled 2023-11-26: qty 0.5

## 2023-11-26 MED ORDER — LIDOCAINE-PRILOCAINE 2.5-2.5 % EX CREA: TOPICAL_CREAM | CUTANEOUS | 3 refills | Status: AC

## 2023-11-26 MED ORDER — ONDANSETRON HCL 8 MG PO TABS: 8.0000 mg | ORAL_TABLET | Freq: Three times a day (TID) | ORAL | 1 refills | Status: AC | PRN

## 2023-11-26 MED ORDER — PROCHLORPERAZINE MALEATE 10 MG PO TABS: 10.0000 mg | ORAL_TABLET | Freq: Four times a day (QID) | ORAL | 1 refills | Status: AC | PRN

## 2023-11-26 NOTE — Progress Notes (Signed)
 START ON PATHWAY REGIMEN - Pancreatic Adenocarcinoma     A cycle is every 28 days:     Capecitabine      Gemcitabine   **Always confirm dose/schedule in your pharmacy ordering system**  Patient Characteristics: Postoperative without Neoadjuvant Therapy, M0 (Pathologic Staging), Negative Margins (Includes Positive Lymph Nodes) Therapeutic Status: Postoperative without Neoadjuvant Therapy, M0 (Pathologic Staging) AJCC T Category: pTX AJCC N Category: pNX AJCC M Category: cM0 AJCC 8 Stage Grouping: IIB Intent of Therapy: Curative Intent, Discussed with Patient

## 2023-11-26 NOTE — Assessment & Plan Note (Addendum)
 Resolved at this time.

## 2023-11-26 NOTE — Assessment & Plan Note (Addendum)
 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  Pathology consistent with pancreatic adenocarcinoma, moderately differentiated, all margins negative, direct tumor extension into splenic vein near the confluence with the portal vein.  So size of the tumor could not be currently assessed because of chronic pancreatitis on the sample. 1 lymph node positive (1/11) Genetic Screening: Pending CA 19-9 normal  - Patient with confirmed at least stage IIb pancreatic adenocarcinoma.  Discussed the diagnosis and prognosis in detail. - The rest the need of adjuvant chemotherapy for decreasing the recurrence of disease. - Considering patient's age and comorbidities and somewhat limited functional status, I think gemcitabine with Abraxane is better tolerated than modified FOLFIRINOX. -The major side effects to include myelosuppression (notably neutropenia and thrombocytopenia), peripheral neuropathy (numbness, tingling, or pain in hands/feet), and substantial fatigue. Other frequent effects are gastrointestinal (nausea, vomiting, diarrhea), alopecia, rash, decreased appetite, dehydration, and in some cases more serious issues like infection/sepsis or hepatotoxicity. - This regimen has a median overall survival of 41.8 months which is improved compared to gemcitabine alone.  Plan chemotherapy for 6 months - Will consider port placement for easier chemotherapy administration for labs.  Risk versus benefits of port placement were discussed in detail including the risks of infection, bleeding and thrombosis.  Will place an IR consult. - Will obtain baseline labs today - Will obtain a baseline CT CAP prior to start of chemotherapy.  Will repeat in 3 months after starting chemotherapy. - Will schedule for chemo education  Return to clinic prior to second cycle of chemotherapy for assessment of tolerance

## 2023-11-26 NOTE — Assessment & Plan Note (Addendum)
 Patient has normocytic anemia.  Likely contributed by iron  deficiency that was evident on prior labs  - Will obtain iron  panel and ferritin today.  Will replace as needed.

## 2023-11-26 NOTE — Assessment & Plan Note (Signed)
 Incidental diagnosis on omental nodule biopsy Low-grade and low risk with low mitotic rate.  - Will observe at this time - Will send Caris testing for KIT mutation assessment

## 2023-11-26 NOTE — Assessment & Plan Note (Addendum)
 Since the patient is planned to undergo splenectomy, will administer the following vaccines today Pneumococcal vaccine Prevnar 20 , Hib vaccine and  2 meningococcal vaccines  - Will administer PPSVV 23 today and every 5 years after that - Meningococcal vaccine booster every 5 years - Yearly influenza vaccine

## 2023-11-26 NOTE — Patient Instructions (Addendum)
 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  You will receive the chemotherapies Abraxane and Gemzar with the intent of treatment as curative.   You will see the doctor regularly throughout treatment.  We will obtain blood work from you prior to every treatment and monitor your results to make sure it is safe to give your treatment. The doctor monitors your response to treatment by the way you are feeling, your blood work, and by obtaining scans periodically.  There will be wait times while you are here for treatment.  It will take about 30 minutes to 1 hour for your lab work to result.  Then there will be wait times while pharmacy mixes your medications.     Compazine :  to help prevent any nausea you may experience with this treatment.     Nab-paclitaxel (protein-bound) (Abraxane)  About This Drug Nab-paclitaxel is used to treat cancer. It is given in the vein (IV).  It takes 30 minutes to infuse.   Possible Side Effects  Bone marrow suppression. This is a decrease in the number of white blood cells, red blood cells, and platelets. This may raise your risk of infection, make you tired and weak (fatigue), and raise your risk of bleeding.   Abnormal heart beat, blood pressure, and/or abnormal EKG (electrocardiogram)   Nausea and vomiting (throwing up)   Diarrhea (loose bowel movements)   Tiredness, weakness   Swelling of your legs, ankles and/or feet   Fever   Changes in your liver function   Infection   Dehydration (lack of water in the body from losing too much fluid)   Decreased appetite (decreased hunger)   Joint, bone and muscle pain   Rash   Effects on the nerves are called peripheral neuropathy. You may feel numbness, tingling, or pain in your hands and feet. It may be hard for you to button your clothes, open jars, or walk as usual. The effect on the nerves may get worse with  more doses of the drug. These effects get better in some people after the drug is stopped but it does not get better in all people.   Hair loss. Hair loss is often temporary, although with certain medicine, hair loss can sometimes be permanent. Hair loss may happen suddenly or gradually. If you lose hair, you may lose it from your head, face, armpits, pubic area, chest, and/or legs. You may also notice your hair getting thin.  Note: Each of the side effects above was reported in 20% or greater of patients treated with nabpaclitaxel. Not all possible side effects are included above.  Warnings and Precautions  Severe bone marrow suppression   Inflammation (swelling) of the lungs. You may have a dry cough or trouble breathing.  Severe infections which can be life-threatening   Severe peripheral neuropathy   Allergic reactions, including anaphylaxis are rare but may happen in some patients, which can be life-threatening. Signs of allergic reaction to this drug may be swelling of the face, feeling like your tongue or throat are swelling, trouble breathing, rash, itching, fever, chills, feeling dizzy, and/or feeling that your heart is beating in a fast or not normal way. If this happens, do not take another dose of this drug. You should get urgent medical treatment.   This drug contains albumin , which is a protein from donated human blood. There is a rare risk of transmission of viral diseases.  Note: Some of the side effects above are very rare. If you have concerns and/or questions, please discuss them with your medical team.  Important Information  This drug may be present in the saliva, tears, sweat, urine, stool, vomit, semen, and vaginal secretions. Talk to your doctor and/or your nurse about the necessary precautions to take during this time.  Treating Side Effects  To decrease the risk of infection, wash your hands regularly.   Avoid close contact with people who have a cold, the flu, or  other infections.   Take your temperature as your doctor or nurse tells you, and whenever you feel like you may have a fever.   To help decrease the risk of bleeding, use a soft toothbrush. Check with your nurse before using dental floss.   Be very careful when using knives or tools.   Use an electric shaver instead of a razor.   Manage tiredness by pacing your activities for the day. Be sure to include periods of rest between energy-draining activities.   Drink plenty of fluids (a minimum of eight glasses per day is recommended).   To help with decreased appetite, eat small, frequent meals. Eat foods high in calories and protein, such as meat, poultry, fish, dry beans, tofu, eggs, nuts, milk, yogurt, cheese, ice cream, pudding, and nutritional supplements.   Consider using sauces and spices to increase taste. Daily exercise, with your doctor's approval, may increase your appetite.   If you throw up or have loose bowel movements, you should drink more fluids so that you do not become dehydrated (lack of water in the body from losing too much fluid).   To help with nausea and vomiting, eat small, frequent meals instead of three large meals a day. Choose foods and drinks that are at room temperature. Ask your nurse or doctor about other helpful tips and medicine that is available to help stop or lessen these symptoms.   If you have diarrhea, eat low-fiber foods that are high in protein and calories and avoid foods that can irritate your digestive tracts or lead to cramping.   Ask your nurse or doctor about medicine that can lessen or stop your diarrhea.   To help with hair loss, wash hair with a mild shampoo and avoid washing your hair every day.   Avoid rubbing your scalp, pat your hair or scalp dry.   Avoid coloring your hair.   Limit your use of hair spray, electric curlers, blow dryers, and curling irons.   If you are interested in getting a wig, talk to your nurse. You  can also call the [employer] at 800-ACS-2345 to find out information about the "Look Good, Feel Better" program closeto where you live. It is a free program where women getting chemotherapy can learn about wigs, turbans and scarves as well as makeup techniques and skin and nail care.   Keeping your pain under control is important to your well-being. Please tell your doctor or nurse if you are experiencing pain.   If you get a rash do not put anything on it unless your doctor or nurse says you may. Keep the area around the rash clean and dry. Ask your doctor for medicine if your rash bothers you.   If you have numbness and tingling in your hands and feet, be careful when cooking, walking, and handling sharp objects and hot liquids.  Food and Drug Interactions  There are no known interactions of nab-paclitaxel of with food.  This drug may interact with other medicines. Tell your doctor and pharmacist about all the prescription and over-the-counter medicines and dietary supplements (vitamins, minerals, herbs and others) that you are taking at this time. Also, check with your doctor or pharmacist before starting any new prescription or over-the-counter medicines, or dietary supplements to make sure that there are no interactions.  When to Call the Doctor Call your doctor or nurse if you have any of these symptoms and/or any new or unusual symptoms:   Fever of 100.4 F (38 C) or higher   Chills   Signs of a local infection such as pain, redness, tenderness, warmth and/or swelling   Easy bleeding or bruising   Wheezing or trouble breathing   Tiredness that interferes with your daily activities   Pain in your chest   Dry cough   Feeling dizzy or lightheaded   Feeling that your heart is beating in a fast or not normal way (palpitations)   Diarrhea, 4 times in one day or diarrhea with weakness or lightheadedness   Nausea that stops you from eating or drinking and/or that  isn't relieved by prescribed medicines   Throwing up   Lasting loss of appetite or rapid weight loss of five pounds in a week   Pain that does not go away or is not relieved by prescribed medicine   Numbness, tingling, or pain in your hands and feet   Weight gain of 5 pounds in one week (fluid retention)   Swelling of your legs, ankles and/or feet   Signs of liver problems: dark urine, pale bowel movements, bad stomach pain, feeling very tired and weak, unusual itching, or yellowing of the eyes or skin   Signs of allergic reaction: swelling of the face, feeling like your tongue or throat are swelling, trouble breathing, rash, itching, fever, chills, feeling dizzy, and/or feeling that your heart is beating in a fast or not normal way. If this happens, call 911 for emergency care.   New rash and/or itching   Rash that is not relieved by prescribed medicines   If you think you are pregnant or have impregnated your partner  Reproduction Warnings  Pregnancy warning: This drug can have harmful effects on the unborn baby. Women of childbearing potential should use effective methods of birth control during your cancer treatment and for at least 6 months after treatment. Men with female partners of childbearing potential should use effective methods of birth control during your cancer treatment and for at least 3 months after your cancer treatment. Let your doctor know right away if you think you may be pregnant or may have impregnated your partner.   Breastfeeding warning: It is not known if this drug passes into breast milk. For this reason, women should not breastfeed during treatment and for 2 weeks after treatment because this drug could enter the breast milk and cause harm to a breastfeeding baby.   Fertility Warning: In men and women both, this drug may affect your ability to have children in the future. Talk with your doctor or nurse if you plan to have children. Ask for information on  sperm or egg banking.   Gemcitabine (Gemzar)  About This Drug Gemcitabine is used to treat cancer. It is given in the vein (IV).  It will take 30 minutes to infuse.   Possible Side Effects  Bone marrow suppression. This is a decrease in the number of white blood cells, red blood cells, and platelets. This may raise your  risk of infection, make you tired and weak (fatigue), and raise your risk of bleeding   Fever   Trouble breathing   Nausea and throwing up (vomiting)   Changes in your liver function   Increased protein in your urine, which can affect how your kidneys work   Blood in your urine   Rash   Swelling of your legs, ankles and/or feet  Note: Each of the side effects above was reported in 20% or greater of patients treated with Gemcitabine. Not all possible side effects are included above.  Warnings and Precautions  Severe bone marrow suppression   Inflammation (swelling) of the lungs and/or thickening of the lung tissues, which may be lifethreatening. You may have a dry cough or trouble breathing.   Changes in your kidney function, which can cause kidney failure   Changes in your liver function, which can cause liver failure and may be life-threatening   If you have received radiation treatments, your skin may become red and/or you may develop soreness of the mouth and throat after gemcitabine. This reaction is called "recall." Your body is recalling, or remembering, that it had radiation therapy.   A syndrome where fluid from your veins can leak into your tissues and cause a decrease in your blood pressure and fluid to accumulate in your tissues and/or lungs.   A syndrome can occur that causes changes to kidney and liver function in combination with a decrease in red blood cells. Kidney failure may result which may be life-threatening.   Changes in your central nervous system can happen. The central nervous system is made up of your brain and spinal cord. You  could feel extreme tiredness, agitation, confusion, hallucinations (see or hear things that are not there), have trouble understanding or speaking, loss of control of your bowels or bladder, eyesight changes, numbness or lack of strength to your arms, legs, face, or body, seizures or coma. If you start to have any of these symptoms let your doctor know right away.  Note: Some of the side effects above are very rare. If you have concerns and/or questions, please discuss them with your medical team.  Important Information  This drug may be present in the saliva, tears, sweat, urine, stool, vomit, semen, and vaginal secretions. Talk to your doctor and/or your nurse about the necessary precautions to take during this time.  Treating Side Effects  Manage tiredness by pacing your activities for the day.   Be sure to include periods of rest between energy-draining activities.   To decrease the risk of infection, wash your hands regularly.   Avoid close contact with people who have a cold, the flu, or other infections.   Take your temperature as your doctor or nurse tells you, and whenever you feel like you may have a fever.   To help decrease bleeding, use a soft toothbrush. Check with your nurse before using dental floss.   Be very careful when using knives or tools.   Use an electric shaver instead of a razor.   Drink plenty of fluids (a minimum of eight glasses per day is recommended).   If you throw up or have loose bowel movements, you should drink more fluids so that you do not become dehydrated (lack of water in the body from losing too much fluid).   To help with nausea and vomiting, eat small, frequent meals instead of three large meals a day. Choose foods and drinks that are at room temperature. Ask your  nurse or doctor about other helpful tips and medicine that is available to help stop or lessen these symptoms.   If you get a rash do not put anything on it unless your doctor or  nurse says you may. Keep the area around the rash clean and dry. Ask your doctor for medicine if your rash bothers you.   If you received radiation, and your skin becomes red or irritated again, or you develop soreness of the mouth and throat, follow the same care instructions you did during radiation treatment. Be sure to tell the nurse or doctor administering your chemotherapy about your skin changes.  Food and Drug Interactions  There are no known interactions of gemcitabine with food.   This drug may interact with other medicines. Tell your doctor and pharmacist about all the prescription and over-the-counter medicines and dietary supplements (vitamins, minerals, herbs and others) that you are taking at this time. Also, check with your doctor or pharmacist before starting any new prescription or over-the-counter medicines, or dietary supplements to make sure that there are no interactions.  When to Call the Doctor Call your doctor or nurse if you have any of these symptoms and/or any new or unusual symptoms:   Fever of 100.4 F (38 C) or higher   Chills   Tiredness that interferes with your daily activities   Feeling dizzy or lightheaded   Pain in your chest   Dry cough   Wheezing and/or trouble breathing   Confusion and/or agitation   Symptoms of a seizure such as confusion, blacking out, passing out, loss of hearing or vision, blurred vision, unusual smells or tastes (such as burning rubber), trouble talking, tremors or shaking in parts or all of the body, repeated body movements, tense muscles that do not relax, and loss of control of urine and bowels. If you or your family member suspects you are having a seizure, call 911 right away.   Hallucinations   Trouble understanding or speaking   Blurry vision or changes in your eyesight   Numbness or lack of strength to your arms, legs, face, or body   Easy bleeding or bruising   Nausea that stops you from eating or drinking  and/or is not relieved by prescribed medicines   Throwing up   Swelling of legs, ankles, or feet   Weight gain of 5 pounds in one week (fluid retention)   Blood in urine   Decreased urine or very dark urine   Foamy or bubbly-looking urine   A new rash/itching or a rash that is not relieved by prescribed medicines   Signs of possible liver problems: dark urine, pale bowel movements, bad stomach pain, feeling very tired and weak, unusual itching, or yellowing of the eyes or skin    If you think you may be pregnant or may have impregnated your partner  Reproduction Warnings   Pregnancy warning: This drug can have harmful effects on the unborn baby. Women of childbearing potential should use effective methods of birth control during your cancer treatment and for 6 months after treatment. Men with female partners of childbearing potential should use effective methods of birth control during your cancer treatment and for 3 months after your cancer treatment. Let your doctor know right away if you think you may be pregnant or may have impregnated your partner.   Breastfeeding warning: Women should not breastfeed during treatment and for 1 week after treatment because this drug could enter the breast milk and cause harm  to a breastfeeding baby.   Fertility warning: In men, this drug may affect your ability to have children in the future. Talk with your doctor or nurse if you plan to have children. Ask for information on sperm banking.  SELF CARE ACTIVITIES WHILE RECEIVING CHEMOTHERAPY:  Hydration Increase your fluid intake 48 hours prior to treatment and drink at least 8 to 12 cups (64 ounces) of water/decaffeinated beverages per day after treatment. You can still have your cup of coffee or soda but these beverages do not count as part of your 8 to 12 cups that you need to drink daily. No alcohol intake.  Medications Continue taking your normal prescription medication as prescribed.  If you  start any new herbal or new supplements please let us  know first to make sure it is safe.  Mouth Care Have teeth cleaned professionally before starting treatment. Keep dentures and partial plates clean. Use soft toothbrush and do not use mouthwashes that contain alcohol. Biotene is a good mouthwash that is available at most pharmacies or may be ordered by calling [phone]. Use warm salt water gargles (1 teaspoon salt per 1 quart warm water) before and after meals and at bedtime. If you need dental work, please let the doctor know before you go for your appointment so that we can coordinate the best possible time for you in regards to your chemo regimen. You need to also let your dentist know that you are actively taking chemo. We may need to do labs prior to your dental appointment.  Skin Care Always use sunscreen that has not expired and with SPF (Sun Protection Factor) of 50 or higher. Wear hats to protect your head from the sun. Remember to use sunscreen on your hands, ears, face, & feet.  Use good moisturizing lotions such as udder cream, eucerin, or even Vaseline. Some chemotherapies can cause dry skin, color changes in your skin and nails.    Avoid long, hot showers or baths. Use gentle, fragrance-free soaps and laundry detergent. Use moisturizers, preferably creams or ointments rather than lotions because the thicker consistency is better at preventing skin dehydration. Apply the cream or ointment within 15 minutes of showering. Reapply moisturizer at night, and moisturize your hands every time after you wash them.  Hair Loss (if your doctor says your hair will fall out)  If your doctor says that your hair is likely to fall out, decide before you begin chemo whether you want to wear a wig. You may want to shop before treatment to match your hair color. Hats, turbans, and scarves can also camouflage hair loss, although some people prefer to leave their heads uncovered. If you go  bare-headed outdoors, be sure to use sunscreen on your scalp. Cut your hair short. It eases the inconvenience of shedding lots of hair, but it also can reduce the emotional impact of watching your hair fall out. Don't perm or color your hair during chemotherapy. Those chemical treatments are already damaging to hair and can enhance hair loss. Once your chemo treatments are done and your hair has grown back, it's OK to resume dyeing or perming hair.  With chemotherapy, hair loss is almost always temporary. But when it grows back, it may be a different color or texture. In older adults who still had hair color before chemotherapy, the new growth may be completely gray.  Often, new hair is very fine and soft.  Infection Prevention Please wash your hands for at least 30 seconds using warm  soapy water. Handwashing is the #1 way to prevent the spread of germs. Stay away from sick people or people who are getting over a cold. If you develop respiratory systems such as green/yellow mucus production or productive cough or persistent cough let us  know and we will see if you need an antibiotic. It is a good idea to keep a pair of gloves on when going into grocery stores/[employer] to decrease your risk of coming into contact with germs on the carts, etc. Carry alcohol hand gel with you at all times and use it frequently if out in public. If your temperature reaches 100.5 or higher please call the clinic and let us  know.  If it is after hours or on the weekend please go to the ER if your temperature is over 100.5.  Please have your own personal thermometer at home to use.    Sex and bodily fluids If you are going to have sex, a condom must be used to protect the person that isn't taking chemotherapy. Chemo can decrease your libido (sex drive). For a few days after chemotherapy, chemotherapy can be excreted through your bodily fluids.  When using the toilet please close the lid and flush the toilet twice.  Do this for a  few day after you have had chemotherapy.   Effects of chemotherapy on your sex life Some changes are simple and won't last long. They won't affect your sex life permanently.  Sometimes you may feel: too tired not strong enough to be very active sick or sore  not in the mood anxious or low Your anxiety might not seem related to sex. For example, you may be worried about the cancer and how your treatment is going. Or you may be worried about money, or about how you family are coping with your illness.  These things can cause stress, which can affect your interest in sex. It's important to talk to your partner about how you feel.  Remember - the changes to your sex life don't usually last long. There's usually no medical reason to stop having sex during chemo. The drugs won't have any long term physical effects on your performance or enjoyment of sex. Cancer can't be passed on to your partner during sex  Contraception It's important to use reliable contraception during treatment. Avoid getting pregnant while you or your partner are having chemotherapy. This is because the drugs may harm the baby. Sometimes chemotherapy drugs can leave a man or woman infertile.  This means you would not be able to have children in the future. You might want to talk to someone about permanent infertility. It can be very difficult to learn that you may no longer be able to have children. Some people find counselling helpful. There might be ways to preserve your fertility, although this is easier for men than for women. You may want to speak to a fertility expert. You can talk about sperm banking or harvesting your eggs. You can also ask about other fertility options, such as donor eggs. If you have or have had breast cancer, your doctor might advise you not to take the contraceptive pill. This is because the hormones in it might affect the cancer. It is not known for sure whether or not chemotherapy drugs can be passed on  through semen or secretions from the vagina. Because of this some doctors advise people to use a barrier method if you have sex during treatment. This applies to vaginal, anal or oral sex.  Generally, doctors advise a barrier method only for the time you are actually having the treatment and for about a week after your treatment. Advice like this can be worrying, but this does not mean that you have to avoid being intimate with your partner. You can still have close contact with your partner and continue to enjoy sex.  Animals If you have cats or birds we just ask that you not change the litter or change the cage.  Please have someone else do this for you while you are on chemotherapy.   Food Safety During and After Cancer Treatment Food safety is important for people both during and after cancer treatment. Cancer and cancer treatments, such as chemotherapy, radiation therapy, and stem cell/bone marrow transplantation, often weaken the immune system. This makes it harder for your body to protect itself from foodborne illness, also called food poisoning. Foodborne illness is caused by eating food that contains harmful bacteria, parasites, or viruses.  Foods to avoid Some foods have a higher risk of becoming tainted with bacteria. These include: Unwashed fresh fruit and vegetables, especially leafy vegetables that can hide dirt and other contaminants Raw sprouts, such as alfalfa sprouts Raw or undercooked beef, especially ground beef, or other raw or undercooked meat and poultry Fatty, fried, or spicy foods immediately before or after treatment.  These can sit heavy on your stomach and make you feel nauseous. Raw or undercooked shellfish, such as oysters. Sushi and sashimi, which often contain raw fish.  Unpasteurized beverages, such as unpasteurized fruit juices, raw milk, raw yogurt, or cider Undercooked eggs, such as soft boiled, over easy, and poached; raw, unpasteurized eggs; or foods made with  raw egg, such as homemade raw cookie dough and homemade mayonnaise  Simple steps for food safety  Shop smart. Do not buy food stored or displayed in an unclean area. Do not buy bruised or damaged fruits or vegetables. Do not buy cans that have cracks, dents, or bulges. Pick up foods that can spoil at the end of your shopping trip and store them in a cooler on the way home.  Prepare and clean up foods carefully. Rinse all fresh fruits and vegetables under running water, and dry them with a clean towel or paper towel. Clean the top of cans before opening them. After preparing food, wash your hands for 20 seconds with hot water and soap. Pay special attention to areas between fingers and under nails. Clean your utensils and dishes with hot water and soap. Disinfect your kitchen and cutting boards using 1 teaspoon of liquid, unscented bleach mixed into 1 quart of water.    Dispose of old food. Eat canned and packaged food before its expiration date (the "use by" or "best before" date). Consume refrigerated leftovers within 3 to 4 days. After that time, throw out the food. Even if the food does not smell or look spoiled, it still may be unsafe. Some bacteria, such as Listeria, can grow even on foods stored in the refrigerator if they are kept for too long.  Take precautions when eating out. At restaurants, avoid buffets and salad bars where food sits out for a long time and comes in contact with many people. Food can become contaminated when someone with a virus, often a norovirus, or another "bug" handles it. Put any leftover food in a "to-go" container yourself, rather than having the server do it. And, refrigerate leftovers as soon as you get home. Choose restaurants that are clean and that are  willing to prepare your food as you order it cooked.   AT HOME MEDICATIONS:                                                                                                                                                                 Compazine /Prochlorperazine  10mg  tablet. Take 1 tablet every 6 hours as needed for nausea/vomiting. (This can make you sleepy)   EMLA cream. Apply a quarter size amount to port site 1 hour prior to chemo. Do not rub in. Cover with plastic wrap.    Diarrhea Sheet   If you are having loose stools/diarrhea, please purchase Imodium and begin taking as outlined:  At the first sign of poorly formed or loose stools you should begin taking Imodium (loperamide) 2 mg capsules.  Take two tablets (4mg ) followed by one tablet (2mg ) every 2 hours - DO NOT EXCEED 8 tablets in 24 hours.  If it is bedtime and you are having loose stools, take 2 tablets at bedtime, then 2 tablets every 4 hours until morning.   Always call the Cancer Center if you are having loose stools/diarrhea that you can't get under control.  Loose stools/diarrhea leads to dehydration (loss of water) in your body.  We have other options of trying to get the loose stools/diarrhea to stop but you must let us  know!   Constipation Sheet  Colace - 100 mg capsules - take 2 capsules daily.  If this doesn't help then you can increase to 2 capsules twice daily.  Please call if the above does not work for you. Do not go more than 2 days without a bowel movement.  It is very important that you do not become constipated.  It will make you feel sick to your stomach (nausea) and can cause abdominal pain and vomiting.  Nausea Sheet   Compazine /Prochlorperazine  10mg  tablet. Take 1 tablet every 6 hours as needed for nausea/vomiting (This can make you drowsy).  If you are having persistent nausea (nausea that does not stop) please call the Cancer Center and let us  know the amount of nausea that you are experiencing.  If you begin to vomit, you need to call the Cancer Center and if it is the weekend and you have vomited more than one time and can't get it to stop-go to the Emergency Room.  Persistent nausea/vomiting can lead  to dehydration (loss of fluid in your body) and will make you feel very weak and unwell. Ice chips, sips of clear liquids, foods that are at room temperature, crackers, and toast tend to be better tolerated.   SYMPTOMS TO REPORT AS SOON AS POSSIBLE AFTER TREATMENT:  FEVER GREATER THAN 100.5 F  CHILLS WITH OR WITHOUT FEVER  NAUSEA AND VOMITING THAT IS NOT CONTROLLED WITH  YOUR NAUSEA MEDICATION  UNUSUAL SHORTNESS OF BREATH  UNUSUAL BRUISING OR BLEEDING  TENDERNESS IN MOUTH AND THROAT WITH OR WITHOUT   PRESENCE OF ULCERS  URINARY PROBLEMS  BOWEL PROBLEMS  UNUSUAL RASH     Wear comfortable clothing and clothing appropriate for easy access to any Portacath or PICC line. Let us  know if there is anything that we can do to make your therapy better!   What to do if you need assistance after hours or on the weekends: CALL [phone].  HOLD on the line, do not hang up.  You will hear multiple messages but at the end you will be connected with a nurse triage line.  They will contact the doctor if necessary.  Most of the time they will be able to assist you.  Do not call the hospital operator.     I have been informed and understand all of the instructions given to me and have received a copy. I have been instructed to call the clinic [phone] or my family physician as soon as possible for continued medical care, if indicated. I do not have any more questions at this time but understand that I may call the Cancer Center or the Patient Navigator at [phone] during office hours should I have questions or need assistance in obtaining follow-up care.

## 2023-11-26 NOTE — Progress Notes (Signed)
 Patient tolerated vaccine injections with no complaints voiced. Per pt she has received information about the vaccines and why she is receiving them. Site clean and dry with no bruising or swelling noted at site.  See MAR for details.  Band aid applied.  Patient stable during and after injection.  Vss with discharge and left in satisfactory condition with no s/s of distress noted. All follow ups as scheduled.   [name]

## 2023-11-26 NOTE — Assessment & Plan Note (Addendum)
 Patient reported improvement with stool softeners  - Continue prescribed Colace twice daily

## 2023-11-27 ENCOUNTER — Encounter: Payer: Self-pay | Admitting: Oncology

## 2023-11-27 ENCOUNTER — Other Ambulatory Visit: Payer: Self-pay

## 2023-11-27 ENCOUNTER — Telehealth: Payer: Self-pay | Admitting: Pharmacy Technician

## 2023-11-27 ENCOUNTER — Other Ambulatory Visit (HOSPITAL_COMMUNITY): Payer: Self-pay | Admitting: Student

## 2023-11-27 ENCOUNTER — Other Ambulatory Visit (HOSPITAL_COMMUNITY): Payer: Self-pay

## 2023-11-27 LAB — CEA: CEA: 5.4 ng/mL — ABNORMAL HIGH (ref 0.0–4.7)

## 2023-11-27 LAB — CANCER ANTIGEN 19-9: CA 19-9: 34 U/mL (ref 0–35)

## 2023-11-27 NOTE — Telephone Encounter (Signed)
 Oral Oncology Patient Advocate Encounter  Prior Authorization for Ondansetron  HCI 8mg  tabs has been approved.    PA# [id number] Effective dates: 11/27/2023 through 02/27/2024  Patients co-pay is $0.    Kiyra Slaubaugh (Patty) Chet Burnet, CPhT  [facility], [place], [facility] - Oral Chemotherapy Patient Advocate Specialist III Phone: [phone]  Fax: [phone]

## 2023-11-27 NOTE — Telephone Encounter (Signed)
 Oral Oncology Patient Advocate Encounter   New authorization   Received notification that prior authorization for Ondansetron  HCI 8mg  tabs is required.   PA submitted on CMM via Latent Key [id number] Status is pending     [name] (Patty) Chet Burnet, CPhT  [facility], [place], [facility] - Oral Chemotherapy Patient Advocate Specialist III Phone: [phone]  Fax: [phone]

## 2023-11-27 NOTE — Telephone Encounter (Signed)
 Oral Oncology Patient Advocate Encounter   New authorization   Received notification that prior authorization for Lidocaine -Prilocaine 2.5-2.5% cream is required.   PA submitted on CMM via Latent Key [id number] Status is pending     Denielle Bayard (Patty) Chet Burnet, CPhT  [facility], [place], [facility] - Oral Chemotherapy Patient Advocate Specialist III Phone: [phone]  Fax: [phone]

## 2023-11-27 NOTE — Telephone Encounter (Signed)
 Oral Oncology Patient Advocate Encounter  Prior Authorization for Lidocaine -Prilocaine 2.5-2.5% cream has been approved.    PA# [id number] Effective dates: 11/27/2023 through 02/25/2024  Patients co-pay is $0.    Merrifield (Patty) Chet Burnet, CPhT  [facility], [place], [facility] - Oral Chemotherapy Patient Advocate Specialist III Phone: [phone]  Fax: [phone]

## 2023-11-28 ENCOUNTER — Other Ambulatory Visit: Payer: Self-pay | Admitting: *Deleted

## 2023-11-28 ENCOUNTER — Ambulatory Visit: Payer: Self-pay | Admitting: *Deleted

## 2023-11-28 ENCOUNTER — Encounter: Payer: Self-pay | Admitting: Oncology

## 2023-11-28 MED ORDER — NITROFURANTOIN MONOHYD MACRO 100 MG PO CAPS
100.0000 mg | ORAL_CAPSULE | Freq: Two times a day (BID) | ORAL | 0 refills | Status: AC
Start: 2023-11-28 — End: 2023-12-06

## 2023-11-28 NOTE — Progress Notes (Signed)
 Discussed recommendations with patient and verbalized understanding.

## 2023-11-28 NOTE — Addendum Note (Signed)
 Addended by: Darian Cansler on: 11/28/2023 08:19 AM   Modules accepted: Orders

## 2023-11-28 NOTE — Progress Notes (Signed)
 Pharmacist Chemotherapy Monitoring - Initial Assessment    Anticipated start date: 12/06/23   The following has been reviewed per standard work regarding the patient's treatment regimen: The patient's diagnosis, treatment plan and drug doses, and organ/hematologic function Lab orders and baseline tests specific to treatment regimen  The treatment plan start date, drug sequencing, and pre-medications Prior authorization status  Patient's documented medication list, including drug-drug interaction screen and prescriptions for anti-emetics and supportive care specific to the treatment regimen The drug concentrations, fluid compatibility, administration routes, and timing of the medications to be used The patient's access for treatment and lifetime cumulative dose history, if applicable  The patient's medication allergies and previous infusion related reactions, if applicable   Changes made to treatment plan:  N/A  Follow up needed:  N/A   [name], [facility], 11/28/2023  11:50 AM

## 2023-11-29 ENCOUNTER — Ambulatory Visit (HOSPITAL_BASED_OUTPATIENT_CLINIC_OR_DEPARTMENT_OTHER)
Admission: RE | Admit: 2023-11-29 | Discharge: 2023-11-29 | Disposition: A | Discharge status: Home / Self Care | Source: Ambulatory Visit | Attending: Oncology | Admitting: Oncology

## 2023-11-29 DIAGNOSIS — C259 Malignant neoplasm of pancreas, unspecified: Secondary | ICD-10-CM | POA: Insufficient documentation

## 2023-11-29 MED ORDER — IOHEXOL 300 MG/ML  SOLN
100.0000 mL | Freq: Once | INTRAMUSCULAR | Status: AC | PRN
Start: 2023-11-29 — End: 2023-11-29
  Administered 2023-11-29: 85 mL via INTRAVENOUS

## 2023-11-29 NOTE — H&P (Shared)
 Chief Complaint: Patient was seen in consultation today for Port-A-Cath placement for chemotherapy administration in treatment of pancreatic adenoma.  Referring Provider(s): Dr. Mickiel Dry, MD   Supervising Physician: Philip Cornet  Patient Status: {IR Consult Patient Status:21879}  ***Patient is Full Code  History of Present Illness: [name] is a 72 y.o. female  with PMHx notable for pancreatic cancer, HTN, COPD, IDA, asthma, and others as delineated below.  Per Dr. 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  Pathology consistent with pancreatic adenocarcinoma, moderately differentiated, all margins negative, direct tumor extension into splenic vein near the confluence with the portal vein.  So size of the tumor could not be currently assessed because of chronic pancreatitis on the sample. 1 lymph node positive (1/11) Genetic Screening: Pending CA 19-9 normal   - Patient with confirmed at least stage IIb pancreatic adenocarcinoma.  Discussed the diagnosis and prognosis in detail. - The rest the need of adjuvant chemotherapy for decreasing the recurrence of disease. - [...] Plan chemotherapy for 6 months - Will consider port placement for easier chemotherapy administration for labs.  Risk versus benefits of port placement were discussed in detail including the risks of infection, bleeding and thrombosis.  Will place an IR consult.  Interventional Radiology was requested for Port-A-Cath placement. Patient is scheduled for same in IR today.   ***Patient is alert and laying in bed, calm.  Patient is currently without any significant complaints.  Patient denies any fevers, headache, chest pain, SOB, cough, abdominal pain, nausea, vomiting or bleeding.     Past Medical History:  Diagnosis Date   Anxiety    Asthma    Cancer (HCC)    COPD (chronic obstructive  pulmonary disease) (HCC)    Hypertension    IBS (irritable bowel syndrome)    IDA (iron  deficiency anemia)     Past Surgical History:  Procedure Laterality Date   ABDOMINAL HYSTERECTOMY     BREAST SURGERY     left lumpectomy   ESOPHAGEAL DILATION N/A 06/12/2023   Procedure: DILATION, ESOPHAGUS;  Surgeon: Cindie Carlin POUR, DO;  [facility];  Service: Endoscopy;  Laterality: N/A;   ESOPHAGOGASTRODUODENOSCOPY N/A 06/12/2023   Procedure: EGD (ESOPHAGOGASTRODUODENOSCOPY);  Surgeon: Cindie Carlin POUR, DO;  Location: [facility];  Service: Endoscopy;  Laterality: N/A;  800am, asa 2   ESOPHAGOGASTRODUODENOSCOPY N/A 09/06/2023   Procedure: EGD (ESOPHAGOGASTRODUODENOSCOPY);  Surgeon: Wilhelmenia Aloha Raddle., MD;  Location: [facility];  Service: Gastroenterology;  Laterality: N/A;   EUS N/A 09/06/2023   Procedure: ULTRASOUND, UPPER GI TRACT, ENDOSCOPIC;  Surgeon: Wilhelmenia Aloha Raddle., MD;  Location: [facility];  Service: Gastroenterology;  Laterality: N/A;   FINE NEEDLE ASPIRATION  09/06/2023   Procedure: FINE NEEDLE ASPIRATION;  Surgeon: Wilhelmenia Aloha Raddle., MD;  Location: [facility];  Service: Gastroenterology;;   HERNIA REPAIR  2008   umbilical hernia repair   LAPAROSCOPY N/A 10/22/2023   Procedure: LAPAROSCOPY, DIAGNOSTIC;  Surgeon: Dasie Leonor CROME, MD;  Location: [facility];  Service: General;  Laterality: N/A;  STAGING LAPAROSCOPY   REPLACEMENT TOTAL KNEE Left 2023   SPLENECTOMY, TOTAL  10/22/2023   Procedure: SPLENECTOMY;  Surgeon: Dasie Leonor CROME, MD;  Location: [facility] OR;  Service: General;;   TONSILLECTOMY      Allergies: Codeine and Egg-derived products  Medications: Prior to Admission medications   Medication Sig Start Date End Date Taking? Authorizing Provider  acetaminophen  (TYLENOL ) 500 MG tablet Take  2 tablets (1,000 mg total) by mouth every 8 (eight) hours as needed for mild pain (pain score 1-3). 10/26/23   Dasie Leonor CROME, MD  albuterol  (PROVENTIL  HFA;VENTOLIN   HFA) 108 (90 BASE) MCG/ACT inhaler Inhale 2 puffs into the lungs every 6 (six) hours as needed for wheezing or shortness of breath.    [provider]  ALPRAZolam  (XANAX ) 0.5 MG tablet Take 0.5 mg by mouth 2 (two) times daily.    [provider]  aspirin  81 MG chewable tablet Chew 1 tablet (81 mg total) by mouth daily. 10/26/23   Dasie Leonor CROME, MD  BREZTRI  AEROSPHERE 160-9-4.8 MCG/ACT AERO Inhale 2 puffs into the lungs 2 (two) times daily. 03/08/21   [provider]  cariprazine  (VRAYLAR ) 1.5 MG capsule Take 1.5 mg by mouth daily. 09/11/23   [provider]  cyanocobalamin (VITAMIN B12) 1000 MCG tablet Take 1,000 mcg by mouth daily.    [provider]  escitalopram  (LEXAPRO ) 10 MG tablet Take 10 mg by mouth daily. 11/02/23   [provider]  furosemide  (LASIX ) 20 MG tablet Take 1 tablet (20 mg total) by mouth daily. Patient not taking: Reported on 11/26/2023 11/02/23   Kandala, Hyndavi, MD  ketoconazole  (NIZORAL ) 2 % cream Apply to affected area (external use only) twice a day, continue at least 2-3 days after rash resolves Patient not taking: Reported on 11/26/2023 08/07/23   Ezzard Sonny RAMAN, PA-C  lidocaine -prilocaine (EMLA) cream Apply to affected area once 11/26/23   Kandala, Hyndavi, MD  lisinopril -hydrochlorothiazide  (ZESTORETIC ) 20-12.5 MG tablet Take 1 tablet by mouth daily.    [provider]  nitrofurantoin , macrocrystal-monohydrate, (MACROBID ) 100 MG capsule Take 1 capsule (100 mg total) by mouth 2 (two) times daily for 5 days. 11/28/23 12/03/23  Davonna Siad, MD  omeprazole  (PRILOSEC) 40 MG capsule Take 1 capsule (40 mg total) by mouth 2 (two) times daily before a meal. 09/06/23   Mansouraty, Aloha Raddle., MD  ondansetron  (ZOFRAN ) 8 MG tablet Take 1 tablet (8 mg total) by mouth every 8 (eight) hours as needed for nausea or vomiting. 11/26/23   Davonna Siad, MD  oxyCODONE  (OXY IR/ROXICODONE ) 5 MG immediate release tablet Take 1  tablet (5 mg total) by mouth every 6 (six) hours as needed for severe pain (pain score 7-10). 10/26/23   Dasie Leonor CROME, MD  PHILLIPS STOOL SOFTENER 100 MG capsule Take 1 capsule by mouth twice daily 11/01/23   Kandala, Hyndavi, MD  polyethylene glycol (MIRALAX ) 17 g packet Take 17 g by mouth daily. Patient not taking: Reported on 11/26/2023 07/25/23   Sherrilee Belvie CROME, MD  prochlorperazine  (COMPAZINE ) 10 MG tablet Take 1 tablet (10 mg total) by mouth every 6 (six) hours as needed for nausea or vomiting. 10/01/23   Kandala, Hyndavi, MD  prochlorperazine  (COMPAZINE ) 10 MG tablet Take 1 tablet (10 mg total) by mouth every 6 (six) hours as needed for nausea or vomiting. 11/26/23   Davonna Siad, MD     Family History  Problem Relation Age of Onset   Lung cancer Mother    Prostate cancer Brother        metastatic   Melanoma Son    Colon cancer Neg Hx    Pancreatic disease Neg Hx     Social History   Socioeconomic History   Marital status: Divorced    Spouse name: Not on file   Number of children: Not on file   Years of education: Not on file   Highest education level:  Not on file  Occupational History   Not on file  Tobacco Use   Smoking status: Some Days    Types: Cigarettes    Start date: 03/31/2023    Last attempt to quit: 02/27/1969    Years since quitting: 54.7   Smokeless tobacco: Never  Vaping Use   Vaping status: Never Used  Substance and Sexual Activity   Alcohol use: No   Drug use: No   Sexual activity: Not on file  Other Topics Concern   Not on file  Social History Narrative   Not on file   Social Drivers of Health   Financial Resource Strain: Low Risk  (07/18/2021)   Received from [facility]   Overall Financial Resource Strain (CARDIA)    Difficulty of Paying Living Expenses: Not very hard  Food Insecurity: No Food Insecurity (10/23/2023)   Hunger Vital Sign    Worried About Running Out of Food in the Last Year: Never true    Ran Out of Food in the Last  Year: Never true  Transportation Needs: No Transportation Needs (10/23/2023)   PRAPARE - Administrator, Civil Service (Medical): No    Lack of Transportation (Non-Medical): No  Physical Activity: Not on file  Stress: No Stress Concern Present (07/18/2021)   Received from [facility] of Occupational Health - Occupational Stress Questionnaire    Feeling of Stress : Only a little  Social Connections: Moderately Integrated (10/23/2023)   Social Connection and Isolation Panel    Frequency of Communication with Friends and Family: More than three times a week    Frequency of Social Gatherings with Friends and Family: Once a week    Attends Religious Services: 1 to 4 times per year    Active Member of [employer] or Organizations: Yes    Attends Banker Meetings: 1 to 4 times per year    Marital Status: Divorced     Review of Systems: A 12 point ROS discussed and pertinent positives are indicated in the HPI above.  All other systems are negative.  Vital Signs: There were no vitals taken for this visit.  Advance Care Plan: The advanced care place/surrogate decision maker was discussed at the time of visit and the patient did not wish to discuss or was not able to name a surrogate decision maker or provide an advance care plan.  Physical Exam  Imaging: VAS US  LOWER EXTREMITY VENOUS (DVT) Result Date: 10/31/2023  Lower Venous DVT Study Patient Name:  [name]  Date of Exam:   10/31/2023 Medical Rec #: [id number]          Accession #:    [id number] Date of Birth: 08/19/1950          Patient Gender: F Patient Age:   38 years Exam Location:  [address] Procedure:      VAS US  LOWER EXTREMITY VENOUS (DVT) Referring Phys: LEONOR ALLEN --------------------------------------------------------------------------------  Indications: Bilateral lower extremity swelling. x 3 days  Risk Factors: Trauma Recent surgery. Pancreatectomy, Laparoscopy, spleenectomy.  Comparison Study: No prior exam. Performing Technologist: Devere Dark RVT  Examination Guidelines: A complete evaluation includes B-mode imaging, spectral Doppler, color Doppler, and power Doppler as needed of all accessible portions of each vessel. Bilateral testing is considered an integral part of a complete examination. Limited examinations for reoccurring indications may be performed as noted. The reflux portion of the exam is performed with the patient in reverse Trendelenburg.  +---------+---------------+---------+-----------+----------+--------------+ RIGHT  CompressibilityPhasicitySpontaneityPropertiesThrombus Aging +---------+---------------+---------+-----------+----------+--------------+ CFV      Full           Yes      Yes                                 +---------+---------------+---------+-----------+----------+--------------+ SFJ      Full           Yes      Yes                                 +---------+---------------+---------+-----------+----------+--------------+ FV Prox  Full           Yes      Yes                                 +---------+---------------+---------+-----------+----------+--------------+ FV Mid   Full           Yes      Yes                                 +---------+---------------+---------+-----------+----------+--------------+ FV DistalFull           Yes      Yes                                 +---------+---------------+---------+-----------+----------+--------------+ PFV      Full           Yes      Yes                                 +---------+---------------+---------+-----------+----------+--------------+ POP      Full           Yes      Yes                                 +---------+---------------+---------+-----------+----------+--------------+ PTV      Full           Yes      Yes                                 +---------+---------------+---------+-----------+----------+--------------+ PERO     Full            Yes      Yes                                 +---------+---------------+---------+-----------+----------+--------------+ Gastroc  Full           Yes      Yes                                 +---------+---------------+---------+-----------+----------+--------------+ GSV      Full           Yes      Yes                                 +---------+---------------+---------+-----------+----------+--------------+  SSV      Full           Yes      Yes                                 +---------+---------------+---------+-----------+----------+--------------+   +---------+---------------+---------+-----------+----------+--------------+ LEFT     CompressibilityPhasicitySpontaneityPropertiesThrombus Aging +---------+---------------+---------+-----------+----------+--------------+ CFV      Full           Yes      Yes                                 +---------+---------------+---------+-----------+----------+--------------+ SFJ      Full           Yes      Yes                                 +---------+---------------+---------+-----------+----------+--------------+ FV Prox  Full           Yes      Yes                                 +---------+---------------+---------+-----------+----------+--------------+ FV Mid   Full           Yes      Yes                                 +---------+---------------+---------+-----------+----------+--------------+ FV DistalFull           Yes      Yes                                 +---------+---------------+---------+-----------+----------+--------------+ PFV      Full           Yes      Yes                                 +---------+---------------+---------+-----------+----------+--------------+ POP      Full           Yes      Yes                                 +---------+---------------+---------+-----------+----------+--------------+ PTV      Full           Yes      Yes                                  +---------+---------------+---------+-----------+----------+--------------+ PERO     Full           Yes      Yes                                 +---------+---------------+---------+-----------+----------+--------------+ Gastroc  Full           Yes      Yes                                 +---------+---------------+---------+-----------+----------+--------------+  GSV      Full           Yes      Yes                                 +---------+---------------+---------+-----------+----------+--------------+ SSV      Full           Yes      Yes                                 +---------+---------------+---------+-----------+----------+--------------+    Findings reported to [place] at 11:25.  Summary: BILATERAL: - No evidence of deep vein thrombosis seen in the lower extremities, bilaterally. - No evidence of superficial venous thrombosis in the lower extremities, bilaterally. - There is no evidence of deep vein thrombosis proximal to the inguial ligament or in the CFV bilaterally.    *See table(s) above for measurements and observations. Electronically signed by Penne Colorado MD on 10/31/2023 at 4:59:08 PM.    Final     Labs:  CBC: Recent Labs    10/23/23 0442 10/24/23 0313 10/25/23 0507 11/26/23 0842  WBC 16.3* 18.2* 21.8* 10.5  HGB 10.2* 10.4* 8.7* 8.2*  HCT 31.8* 33.6* 27.7* 28.1*  PLT 300 195 341 684*    COAGS: No results for input(s): INR, APTT in the last 8760 hours.  BMP: Recent Labs    10/23/23 0309 10/24/23 0313 10/25/23 0507 11/26/23 0842  NA 135 135 138 132*  K 4.5 3.9 3.6 4.0  CL 101 101 100 96*  CO2 23 27 29 26   GLUCOSE 126* 119* 106* 141*  BUN 10 15 12 10   CALCIUM  8.2* 8.3* 8.0* 8.7*  CREATININE 0.70 0.81 0.58 0.67  GFRNONAA >60 >60 >60 >60    LIVER FUNCTION TESTS: Recent Labs    10/23/23 0309 10/24/23 0313 10/25/23 0507 11/26/23 0842  BILITOT 0.4 0.5 0.6 0.5  AST 28 25 13* 14*  ALT 30 28 18 11   ALKPHOS 47 59 52 83   PROT 5.1* 5.7* 4.9* 6.8  ALBUMIN  2.9* 3.3* 2.6* 3.7    TUMOR MARKERS: No results for input(s): AFPTM, CEA, CA199, CHROMGRNA in the last 8760 hours.  Assessment and Plan: Patient is diagnosed with pancreatic cancer, likely stage IIb. Dr. Armanda has recommended  chemotherapy for 6 months, and requested a port placement.  atient presents for same, scheduled Port-A-Cath placement in IR today.  ***Patient has been NPO since midnight.  All labs and medications are within acceptable parameters.  No pertinent allergies.   Risks and benefits of image guided port-a-catheter placement was discussed with the patient including, but not limited to bleeding, infection, pneumothorax, or fibrin sheath development and need for additional procedures.  All of the patient's questions were answered, patient is agreeable to proceed. Consent signed and in chart.    Thank you for allowing our service to participate in [name] 's care.  Electronically Signed: Carlin DELENA Griffon, PA-C   11/29/2023, 8:25 AM      I spent a total of 30 Minutes in face to face in clinical consultation, greater than 50% of which was counseling/coordinating care for  Port-A-Cath placement for chemotherapy administration in treatment of pancreatic adenoma.

## 2023-11-30 ENCOUNTER — Ambulatory Visit (HOSPITAL_COMMUNITY)
Admission: RE | Admit: 2023-11-30 | Discharge: 2023-11-30 | Disposition: A | Discharge status: Home / Self Care | Source: Ambulatory Visit | Attending: Oncology | Admitting: Oncology

## 2023-11-30 DIAGNOSIS — C259 Malignant neoplasm of pancreas, unspecified: Secondary | ICD-10-CM | POA: Insufficient documentation

## 2023-11-30 DIAGNOSIS — F17210 Nicotine dependence, cigarettes, uncomplicated: Secondary | ICD-10-CM | POA: Diagnosis not present

## 2023-11-30 DIAGNOSIS — Z9081 Acquired absence of spleen: Secondary | ICD-10-CM | POA: Diagnosis not present

## 2023-11-30 HISTORY — PX: IR IMAGING GUIDED PORT INSERTION: IMG5740

## 2023-11-30 MED ORDER — FENTANYL CITRATE (PF) 100 MCG/2ML IJ SOLN
INTRAMUSCULAR | Status: AC
  Filled 2023-11-30: qty 2

## 2023-11-30 MED ORDER — MIDAZOLAM HCL 2 MG/2ML IJ SOLN
INTRAMUSCULAR | Status: AC
  Filled 2023-11-30: qty 2

## 2023-11-30 MED ORDER — SODIUM CHLORIDE 0.9 % IV SOLN: INTRAVENOUS | Status: DC

## 2023-11-30 MED ORDER — MIDAZOLAM HCL 2 MG/2ML IJ SOLN
INTRAMUSCULAR | Status: AC | PRN
  Administered 2023-11-30: 1 mg via INTRAVENOUS
  Administered 2023-11-30 (×2): .5 mg via INTRAVENOUS

## 2023-11-30 MED ORDER — LIDOCAINE-EPINEPHRINE 1 %-1:100000 IJ SOLN: 20.0000 mL | Freq: Once | INTRAMUSCULAR | Status: DC

## 2023-11-30 MED ORDER — FENTANYL CITRATE (PF) 100 MCG/2ML IJ SOLN
INTRAMUSCULAR | Status: AC | PRN
  Administered 2023-11-30 (×2): 25 ug via INTRAVENOUS
  Administered 2023-11-30: 50 ug via INTRAVENOUS

## 2023-11-30 MED ORDER — LIDOCAINE HCL 1 % IJ SOLN
INTRAMUSCULAR | Status: AC
  Filled 2023-11-30: qty 20

## 2023-11-30 MED ORDER — HEPARIN SOD (PORK) LOCK FLUSH 100 UNIT/ML IV SOLN
INTRAVENOUS | Status: AC
  Filled 2023-11-30: qty 5

## 2023-11-30 MED ORDER — LIDOCAINE-EPINEPHRINE 1 %-1:100000 IJ SOLN
INTRAMUSCULAR | Status: AC
  Filled 2023-11-30: qty 1

## 2023-11-30 NOTE — H&P (Signed)
 Chief Complaint: Patient was seen in consultation today for Port-A-Cath placement for chemotherapy administration in treatment of pancreatic adenoma.  Referring Provider(s): Dr. Mickiel Dry, MD   Supervising Physician: Philip Cornet  Patient Status: [facility] - Out-pt  Patient is Full Code  History of Present Illness: [name] is a 73 y.o. female  with PMHx notable for pancreatic cancer, HTN, COPD, IDA, asthma, and others as delineated below.  Per Dr. 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  Pathology consistent with pancreatic adenocarcinoma, moderately differentiated, all margins negative, direct tumor extension into splenic vein near the confluence with the portal vein.  So size of the tumor could not be currently assessed because of chronic pancreatitis on the sample. 1 lymph node positive (1/11) Genetic Screening: Pending CA 19-9 normal   - Patient with confirmed at least stage IIb pancreatic adenocarcinoma.  Discussed the diagnosis and prognosis in detail. - The rest the need of adjuvant chemotherapy for decreasing the recurrence of disease. - [...] Plan chemotherapy for 6 months - Will consider port placement for easier chemotherapy administration for labs.  Risk versus benefits of port placement were discussed in detail including the risks of infection, bleeding and thrombosis.  Will place an IR consult.  Interventional Radiology was requested for Port-A-Cath placement. Patient is scheduled for same in IR today.   Patient is alert and laying in bed, calm.  Patient is currently without any significant complaints.  Patient denies any fevers, headache, chest pain, SOB, cough, abdominal pain, nausea, vomiting or bleeding.     Past Medical History:  Diagnosis Date   Anxiety    Asthma    Cancer (HCC)    COPD (chronic obstructive pulmonary disease) (HCC)     Hypertension    IBS (irritable bowel syndrome)    IDA (iron  deficiency anemia)     Past Surgical History:  Procedure Laterality Date   ABDOMINAL HYSTERECTOMY     BREAST SURGERY     left lumpectomy   ESOPHAGEAL DILATION N/A 06/12/2023   Procedure: DILATION, ESOPHAGUS;  Surgeon: Cindie Carlin POUR, DO;  [facility]: [facility];  Service: Endoscopy;  Laterality: N/A;   ESOPHAGOGASTRODUODENOSCOPY N/A 06/12/2023   Procedure: EGD (ESOPHAGOGASTRODUODENOSCOPY);  Surgeon: Cindie Carlin POUR, DO;  Location: [facility];  Service: Endoscopy;  Laterality: N/A;  800am, asa 2   ESOPHAGOGASTRODUODENOSCOPY N/A 09/06/2023   Procedure: EGD (ESOPHAGOGASTRODUODENOSCOPY);  Surgeon: Wilhelmenia Aloha Raddle., MD;  Location: [facility];  Service: Gastroenterology;  Laterality: N/A;   EUS N/A 09/06/2023   Procedure: ULTRASOUND, UPPER GI TRACT, ENDOSCOPIC;  Surgeon: Wilhelmenia Aloha Raddle., MD;  Location: [facility];  Service: Gastroenterology;  Laterality: N/A;   FINE NEEDLE ASPIRATION  09/06/2023   Procedure: FINE NEEDLE ASPIRATION;  Surgeon: Wilhelmenia Aloha Raddle., MD;  Location: [facility];  Service: Gastroenterology;;   HERNIA REPAIR  2008   umbilical hernia repair   LAPAROSCOPY N/A 10/22/2023   Procedure: LAPAROSCOPY, DIAGNOSTIC;  Surgeon: Dasie Leonor CROME, MD;  Location: [facility] OR;  Service: General;  Laterality: N/A;  STAGING LAPAROSCOPY   REPLACEMENT TOTAL KNEE Left 2023   SPLENECTOMY, TOTAL  10/22/2023   Procedure: SPLENECTOMY;  Surgeon: Dasie Leonor CROME, MD;  Location: [facility] OR;  Service: General;;   TONSILLECTOMY      Allergies: Codeine and Egg-derived products  Medications: Prior to Admission medications   Medication Sig Start Date End Date Taking? Authorizing Provider  acetaminophen  (TYLENOL ) 500 MG tablet Take 2  tablets (1,000 mg total) by mouth every 8 (eight) hours as needed for mild pain (pain score 1-3). 10/26/23   Dasie Leonor CROME, MD  albuterol  (PROVENTIL  HFA;VENTOLIN  HFA) 108 (90 BASE)  MCG/ACT inhaler Inhale 2 puffs into the lungs every 6 (six) hours as needed for wheezing or shortness of breath.    [provider]  ALPRAZolam  (XANAX ) 0.5 MG tablet Take 0.5 mg by mouth 2 (two) times daily.    [provider]  aspirin  81 MG chewable tablet Chew 1 tablet (81 mg total) by mouth daily. 10/26/23   Dasie Leonor CROME, MD  BREZTRI  AEROSPHERE 160-9-4.8 MCG/ACT AERO Inhale 2 puffs into the lungs 2 (two) times daily. 03/08/21   [provider]  cariprazine  (VRAYLAR ) 1.5 MG capsule Take 1.5 mg by mouth daily. 09/11/23   [provider]  cyanocobalamin (VITAMIN B12) 1000 MCG tablet Take 1,000 mcg by mouth daily.    [provider]  escitalopram  (LEXAPRO ) 10 MG tablet Take 10 mg by mouth daily. 11/02/23   [provider]  furosemide  (LASIX ) 20 MG tablet Take 1 tablet (20 mg total) by mouth daily. Patient not taking: Reported on 11/26/2023 11/02/23   Kandala, Hyndavi, MD  ketoconazole  (NIZORAL ) 2 % cream Apply to affected area (external use only) twice a day, continue at least 2-3 days after rash resolves Patient not taking: Reported on 11/26/2023 08/07/23   Ezzard Sonny RAMAN, PA-C  lidocaine -prilocaine (EMLA) cream Apply to affected area once 11/26/23   Kandala, Hyndavi, MD  lisinopril -hydrochlorothiazide  (ZESTORETIC ) 20-12.5 MG tablet Take 1 tablet by mouth daily.    [provider]  nitrofurantoin , macrocrystal-monohydrate, (MACROBID ) 100 MG capsule Take 1 capsule (100 mg total) by mouth 2 (two) times daily for 5 days. 11/28/23 12/03/23  Kandala, Hyndavi, MD  omeprazole  (PRILOSEC) 40 MG capsule Take 1 capsule (40 mg total) by mouth 2 (two) times daily before a meal. 09/06/23   Mansouraty, Aloha Raddle., MD  ondansetron  (ZOFRAN ) 8 MG tablet Take 1 tablet (8 mg total) by mouth every 8 (eight) hours as needed for nausea or vomiting. 11/26/23   Davonna Siad, MD  oxyCODONE  (OXY IR/ROXICODONE ) 5 MG immediate release tablet Take 1 tablet (5 mg total) by  mouth every 6 (six) hours as needed for severe pain (pain score 7-10). 10/26/23   Dasie Leonor CROME, MD  PHILLIPS STOOL SOFTENER 100 MG capsule Take 1 capsule by mouth twice daily 11/01/23   Kandala, Hyndavi, MD  polyethylene glycol (MIRALAX ) 17 g packet Take 17 g by mouth daily. Patient not taking: Reported on 11/26/2023 07/25/23   Sherrilee Belvie CROME, MD  prochlorperazine  (COMPAZINE ) 10 MG tablet Take 1 tablet (10 mg total) by mouth every 6 (six) hours as needed for nausea or vomiting. 10/01/23   Kandala, Hyndavi, MD  prochlorperazine  (COMPAZINE ) 10 MG tablet Take 1 tablet (10 mg total) by mouth every 6 (six) hours as needed for nausea or vomiting. 11/26/23   Davonna Siad, MD     Family History  Problem Relation Age of Onset   Lung cancer Mother    Prostate cancer Brother        metastatic   Melanoma Son    Colon cancer Neg Hx    Pancreatic disease Neg Hx     Social History   Socioeconomic History   Marital status: Divorced    Spouse name: Not on file   Number of children: Not on file   Years of education: Not on file   Highest education level: Not  on file  Occupational History   Not on file  Tobacco Use   Smoking status: Some Days    Types: Cigarettes    Start date: 03/31/2023    Last attempt to quit: 02/27/1969    Years since quitting: 54.7   Smokeless tobacco: Never  Vaping Use   Vaping status: Never Used  Substance and Sexual Activity   Alcohol use: No   Drug use: No   Sexual activity: Not on file  Other Topics Concern   Not on file  Social History Narrative   Not on file   Social Drivers of Health   Financial Resource Strain: Low Risk  (07/18/2021)   Received from [facility]   Overall Financial Resource Strain (CARDIA)    Difficulty of Paying Living Expenses: Not very hard  Food Insecurity: No Food Insecurity (10/23/2023)   Hunger Vital Sign    Worried About Running Out of Food in the Last Year: Never true    Ran Out of Food in the Last Year: Never true   Transportation Needs: No Transportation Needs (10/23/2023)   PRAPARE - Administrator, Civil Service (Medical): No    Lack of Transportation (Non-Medical): No  Physical Activity: Not on file  Stress: No Stress Concern Present (07/18/2021)   Received from [facility] of Occupational Health - Occupational Stress Questionnaire    Feeling of Stress : Only a little  Social Connections: Moderately Integrated (10/23/2023)   Social Connection and Isolation Panel    Frequency of Communication with Friends and Family: More than three times a week    Frequency of Social Gatherings with Friends and Family: Once a week    Attends Religious Services: 1 to 4 times per year    Active Member of [employer] or Organizations: Yes    Attends Banker Meetings: 1 to 4 times per year    Marital Status: Divorced     Review of Systems: A 12 point ROS discussed and pertinent positives are indicated in the HPI above.  All other systems are negative.  Vital Signs: BP (!) 123/57   Pulse 83   Temp 97.9 F (36.6 C) (Oral)   Resp 18   Ht 5' (1.524 m)   Wt 133 lb (60.3 kg)   SpO2 99%   BMI 25.97 kg/m   Advance Care Plan: The advanced care place/surrogate decision maker was discussed at the time of visit and the patient did not wish to discuss or was not able to name a surrogate decision maker or provide an advance care plan.  Physical Exam Constitutional:      General: She is not in acute distress.    Appearance: Normal appearance.  HENT:     Mouth/Throat:     Mouth: Mucous membranes are dry.  Cardiovascular:     Rate and Rhythm: Normal rate and regular rhythm.     Pulses: Normal pulses.     Heart sounds: Normal heart sounds. No murmur heard. Pulmonary:     Effort: Pulmonary effort is normal.     Breath sounds: Normal breath sounds. No wheezing.  Abdominal:     General: Abdomen is flat.  Musculoskeletal:        General: Normal range of motion.     Cervical  back: Normal range of motion.  Skin:    General: Skin is warm and dry.  Neurological:     Mental Status: She is alert and oriented to person, place, and  time.  Psychiatric:        Mood and Affect: Mood normal.        Behavior: Behavior normal.        Thought Content: Thought content normal.        Judgment: Judgment normal.     Imaging: CT CHEST ABDOMEN PELVIS W CONTRAST Result Date: 11/29/2023 CLINICAL DATA:  Follow-up of pancreatic cancer. Status post pancreatectomy and splenectomy. * Tracking Code: BO * EXAM: CT CHEST, ABDOMEN, AND PELVIS WITH CONTRAST TECHNIQUE: Multidetector CT imaging of the chest, abdomen and pelvis was performed following the standard protocol during bolus administration of intravenous contrast. RADIATION DOSE REDUCTION: This exam was performed according to the departmental dose-optimization program which includes automated exposure control, adjustment of the mA and/or kV according to patient size and/or use of iterative reconstruction technique. CONTRAST:  85mL OMNIPAQUE  IOHEXOL  300 MG/ML  SOLN COMPARISON:  Chest CT 09/23/2023.  Abdominopelvic CT of 08/08/2023 FINDINGS: CT CHEST FINDINGS Cardiovascular: Aortic atherosclerosis. Normal heart size, without pericardial effusion. No central pulmonary embolism, on this non-dedicated study. Mediastinum/Nodes:   No mediastinal or hilar adenopathy. Lungs/Pleura: No pleural fluid. Anterior right lower lobe scarring. Mild centrilobular emphysema. Smoking related respiratory bronchiolitis. Musculoskeletal: Included within the abdomen pelvic section. CT ABDOMEN PELVIS FINDINGS Hepatobiliary: Prominent lateral segment left liver lobe. Tiny dependent gallstones. No biliary duct dilatation. Pancreas: Normal appearance of the pancreatic uncinate process. There is heterogeneous soft tissue density at the expected site of the pancreatic neck and body, presumably the postoperative bed. Example at 4.9 x 1.7 cm on 65/2. This tracks laterally  towards the spleen including on images 60 through 63 of series 2. Contiguous probable fat necrosis in the left upper quadrant on coronal image 45. Spleen: Surgically absent. Adrenals/Urinary Tract: Normal adrenal glands. Mild renal cortical thinning bilaterally. No hydronephrosis. Normal urinary bladder. Stomach/Bowel: Apparent proximal gastric wall thickening including on 61/2 is most likely related to underdistention. Normal colon and terminal ileum. Normal small bowel. Vascular/Lymphatic: Aortic atherosclerosis. No abdominopelvic adenopathy. Reproductive: Hysterectomy.  No adnexal mass. Other: No significant free fluid. Mild pelvic floor laxity. No evidence of omental or peritoneal disease. Musculoskeletal: Mid lumbar spondylosis. IMPRESSION: 1. Interval splenectomy and resection of the pancreatic neck, body, and tail. Heterogeneous soft tissue density in the operative bed is suspicious for local recurrence, especially given elevated CEA on 11/26/2023. Combination of postoperative scarring and sequelae of subacute pancreatitis felt less likely. Potential clinical strategies include endoscopic ultrasound sampling versus PET. 2. No evidence of metastatic disease. 3. Apparent proximal gastric wall thickening is most likely due to underdistention. Gastritis could look similar. 4. Incidental findings, including: Aortic Atherosclerosis (ICD10-I70.0). Cholelithiasis. Emphysema (ICD10-J43.9). Electronically Signed   By: Rockey Kilts M.D.   On: 11/29/2023 17:39   VAS US  LOWER EXTREMITY VENOUS (DVT) Result Date: 10/31/2023  Lower Venous DVT Study Patient Name:  HELEN WINTERHALTER  Date of Exam:   10/31/2023 Medical Rec #: [id number]          Accession #:    [id number] Date of Birth: [birth year]          Patient Gender: F Patient Age:   30 years Exam Location:  [address] Procedure:      VAS US  LOWER EXTREMITY VENOUS (DVT) Referring Phys: LEONOR ALLEN  --------------------------------------------------------------------------------  Indications: Bilateral lower extremity swelling. x 3 days  Risk Factors: Trauma Recent surgery. Pancreatectomy, Laparoscopy, spleenectomy. Comparison Study: No prior exam. Performing Technologist: Devere Dark RVT  Examination Guidelines: A complete evaluation includes B-mode imaging, spectral Doppler,  color Doppler, and power Doppler as needed of all accessible portions of each vessel. Bilateral testing is considered an integral part of a complete examination. Limited examinations for reoccurring indications may be performed as noted. The reflux portion of the exam is performed with the patient in reverse Trendelenburg.  +---------+---------------+---------+-----------+----------+--------------+ RIGHT    CompressibilityPhasicitySpontaneityPropertiesThrombus Aging +---------+---------------+---------+-----------+----------+--------------+ CFV      Full           Yes      Yes                                 +---------+---------------+---------+-----------+----------+--------------+ SFJ      Full           Yes      Yes                                 +---------+---------------+---------+-----------+----------+--------------+ FV Prox  Full           Yes      Yes                                 +---------+---------------+---------+-----------+----------+--------------+ FV Mid   Full           Yes      Yes                                 +---------+---------------+---------+-----------+----------+--------------+ FV DistalFull           Yes      Yes                                 +---------+---------------+---------+-----------+----------+--------------+ PFV      Full           Yes      Yes                                 +---------+---------------+---------+-----------+----------+--------------+ POP      Full           Yes      Yes                                  +---------+---------------+---------+-----------+----------+--------------+ PTV      Full           Yes      Yes                                 +---------+---------------+---------+-----------+----------+--------------+ PERO     Full           Yes      Yes                                 +---------+---------------+---------+-----------+----------+--------------+ Gastroc  Full           Yes      Yes                                 +---------+---------------+---------+-----------+----------+--------------+  GSV      Full           Yes      Yes                                 +---------+---------------+---------+-----------+----------+--------------+ SSV      Full           Yes      Yes                                 +---------+---------------+---------+-----------+----------+--------------+   +---------+---------------+---------+-----------+----------+--------------+ LEFT     CompressibilityPhasicitySpontaneityPropertiesThrombus Aging +---------+---------------+---------+-----------+----------+--------------+ CFV      Full           Yes      Yes                                 +---------+---------------+---------+-----------+----------+--------------+ SFJ      Full           Yes      Yes                                 +---------+---------------+---------+-----------+----------+--------------+ FV Prox  Full           Yes      Yes                                 +---------+---------------+---------+-----------+----------+--------------+ FV Mid   Full           Yes      Yes                                 +---------+---------------+---------+-----------+----------+--------------+ FV DistalFull           Yes      Yes                                 +---------+---------------+---------+-----------+----------+--------------+ PFV      Full           Yes      Yes                                  +---------+---------------+---------+-----------+----------+--------------+ POP      Full           Yes      Yes                                 +---------+---------------+---------+-----------+----------+--------------+ PTV      Full           Yes      Yes                                 +---------+---------------+---------+-----------+----------+--------------+ PERO     Full           Yes      Yes                                 +---------+---------------+---------+-----------+----------+--------------+  Gastroc  Full           Yes      Yes                                 +---------+---------------+---------+-----------+----------+--------------+ GSV      Full           Yes      Yes                                 +---------+---------------+---------+-----------+----------+--------------+ SSV      Full           Yes      Yes                                 +---------+---------------+---------+-----------+----------+--------------+    Findings reported to [place] at 11:25.  Summary: BILATERAL: - No evidence of deep vein thrombosis seen in the lower extremities, bilaterally. - No evidence of superficial venous thrombosis in the lower extremities, bilaterally. - There is no evidence of deep vein thrombosis proximal to the inguial ligament or in the CFV bilaterally.    *See table(s) above for measurements and observations. Electronically signed by Penne Colorado MD on 10/31/2023 at 4:59:08 PM.    Final     Labs:  CBC: Recent Labs    10/23/23 0442 10/24/23 0313 10/25/23 0507 11/26/23 0842  WBC 16.3* 18.2* 21.8* 10.5  HGB 10.2* 10.4* 8.7* 8.2*  HCT 31.8* 33.6* 27.7* 28.1*  PLT 300 195 341 684*    COAGS: No results for input(s): INR, APTT in the last 8760 hours.  BMP: Recent Labs    10/23/23 0309 10/24/23 0313 10/25/23 0507 11/26/23 0842  NA 135 135 138 132*  K 4.5 3.9 3.6 4.0  CL 101 101 100 96*  CO2 23 27 29 26   GLUCOSE 126* 119* 106* 141*  BUN 10  15 12 10   CALCIUM  8.2* 8.3* 8.0* 8.7*  CREATININE 0.70 0.81 0.58 0.67  GFRNONAA >60 >60 >60 >60    LIVER FUNCTION TESTS: Recent Labs    10/23/23 0309 10/24/23 0313 10/25/23 0507 11/26/23 0842  BILITOT 0.4 0.5 0.6 0.5  AST 28 25 13* 14*  ALT 30 28 18 11   ALKPHOS 47 59 52 83  PROT 5.1* 5.7* 4.9* 6.8  ALBUMIN  2.9* 3.3* 2.6* 3.7    TUMOR MARKERS: No results for input(s): AFPTM, CEA, CA199, CHROMGRNA in the last 8760 hours.  Assessment and Plan: Patient is diagnosed with pancreatic cancer, likely stage IIb. Dr. Armanda has recommended  chemotherapy for 6 months, and requested a port placement.  atient presents for same, scheduled Port-A-Cath placement in IR today.  Patient has been NPO since midnight.  All labs and medications are within acceptable parameters.  Allergies reviewed: Codeine (nausea and vomiting); egg-derived products.  Risks and benefits of image guided port-a-catheter placement was discussed with the patient including, but not limited to bleeding, infection, pneumothorax, or fibrin sheath development and need for additional procedures.  All of the patient's questions were answered, patient is agreeable to proceed. Consent signed and in chart.    Thank you for allowing our service to participate in [name] 's care.  Electronically Signed: Carlin DELENA Griffon, PA-C   11/30/2023, 8:29 AM      I spent a total  of 30 Minutes in face to face in clinical consultation, greater than 50% of which was counseling/coordinating care for  Port-A-Cath placement for chemotherapy administration in treatment of pancreatic adenoma.

## 2023-11-30 NOTE — Progress Notes (Signed)
 Patient was given discharge instructions. She verbalized understanding.

## 2023-11-30 NOTE — Procedures (Signed)
 Interventional Radiology Procedure:   Indications: Pancreatic cancer  Procedure: Port placement  Findings: Right jugular port, tip at SVC/RA junction  Complications: None     EBL: Minimal, less than 10 ml  Plan: Discharge in one hour.  Keep port site and incisions dry for at least 24 hours.     Jak Haggar R. Philip, MD  Pager: [phone]

## 2023-12-03 ENCOUNTER — Telehealth: Payer: Self-pay | Admitting: Dietician

## 2023-12-03 ENCOUNTER — Inpatient Hospital Stay

## 2023-12-03 ENCOUNTER — Inpatient Hospital Stay: Admitting: Dietician

## 2023-12-03 ENCOUNTER — Inpatient Hospital Stay: Attending: Oncology

## 2023-12-03 VITALS — BP 119/57 | HR 71 | Temp 97.9°F | Resp 18

## 2023-12-03 DIAGNOSIS — J4489 Other specified chronic obstructive pulmonary disease: Secondary | ICD-10-CM | POA: Insufficient documentation

## 2023-12-03 DIAGNOSIS — K5900 Constipation, unspecified: Secondary | ICD-10-CM | POA: Diagnosis not present

## 2023-12-03 DIAGNOSIS — Z791 Long term (current) use of non-steroidal anti-inflammatories (NSAID): Secondary | ICD-10-CM | POA: Insufficient documentation

## 2023-12-03 DIAGNOSIS — Z79899 Other long term (current) drug therapy: Secondary | ICD-10-CM | POA: Diagnosis not present

## 2023-12-03 DIAGNOSIS — R600 Localized edema: Secondary | ICD-10-CM | POA: Insufficient documentation

## 2023-12-03 DIAGNOSIS — T451X5A Adverse effect of antineoplastic and immunosuppressive drugs, initial encounter: Secondary | ICD-10-CM | POA: Insufficient documentation

## 2023-12-03 DIAGNOSIS — D6481 Anemia due to antineoplastic chemotherapy: Secondary | ICD-10-CM | POA: Insufficient documentation

## 2023-12-03 DIAGNOSIS — D509 Iron deficiency anemia, unspecified: Secondary | ICD-10-CM

## 2023-12-03 DIAGNOSIS — C259 Malignant neoplasm of pancreas, unspecified: Secondary | ICD-10-CM

## 2023-12-03 DIAGNOSIS — Z5111 Encounter for antineoplastic chemotherapy: Secondary | ICD-10-CM | POA: Diagnosis present

## 2023-12-03 DIAGNOSIS — Z9081 Acquired absence of spleen: Secondary | ICD-10-CM | POA: Diagnosis not present

## 2023-12-03 DIAGNOSIS — C251 Malignant neoplasm of body of pancreas: Secondary | ICD-10-CM | POA: Insufficient documentation

## 2023-12-03 DIAGNOSIS — Z87440 Personal history of urinary (tract) infections: Secondary | ICD-10-CM | POA: Diagnosis not present

## 2023-12-03 DIAGNOSIS — K861 Other chronic pancreatitis: Secondary | ICD-10-CM | POA: Insufficient documentation

## 2023-12-03 LAB — COMPREHENSIVE METABOLIC PANEL WITH GFR
ALT: <5 U/L (ref 0–44)
AST: 15 U/L (ref 15–41)
Albumin: 3.9 g/dL (ref 3.5–5.0)
Alkaline Phosphatase: 82 U/L (ref 38–126)
Anion gap: 10 (ref 5–15)
BUN: 13 mg/dL (ref 8–23)
CO2: 23 mmol/L (ref 22–32)
Calcium: 8.8 mg/dL — ABNORMAL LOW (ref 8.9–10.3)
Chloride: 94 mmol/L — ABNORMAL LOW (ref 98–111)
Creatinine, Ser: 0.51 mg/dL (ref 0.44–1.00)
GFR, Estimated: >60 mL/min
Glucose, Bld: 233 mg/dL — ABNORMAL HIGH (ref 70–99)
Potassium: 3.9 mmol/L (ref 3.5–5.1)
Sodium: 127 mmol/L — ABNORMAL LOW (ref 135–145)
Total Bilirubin: 0.3 mg/dL (ref 0.0–1.2)
Total Protein: 6.4 g/dL — ABNORMAL LOW (ref 6.5–8.1)

## 2023-12-03 LAB — CBC WITH DIFFERENTIAL/PLATELET
Abs Immature Granulocytes: 0.04 K/uL (ref 0.00–0.07)
Basophils Absolute: 0.2 K/uL — ABNORMAL HIGH (ref 0.0–0.1)
Basophils Relative: 2 %
Eosinophils Absolute: 0.3 K/uL (ref 0.0–0.5)
Eosinophils Relative: 3 %
HCT: 24.1 % — ABNORMAL LOW (ref 36.0–46.0)
Hemoglobin: 7 g/dL — ABNORMAL LOW (ref 12.0–15.0)
Immature Granulocytes: 0 %
Lymphocytes Relative: 26 %
Lymphs Abs: 2.7 K/uL (ref 0.7–4.0)
MCH: 21.8 pg — ABNORMAL LOW (ref 26.0–34.0)
MCHC: 29 g/dL — ABNORMAL LOW (ref 30.0–36.0)
MCV: 75.1 fL — ABNORMAL LOW (ref 80.0–100.0)
Monocytes Absolute: 0.9 K/uL (ref 0.1–1.0)
Monocytes Relative: 9 %
Neutro Abs: 6.4 K/uL (ref 1.7–7.7)
Neutrophils Relative %: 60 %
Platelets: 653 K/uL — ABNORMAL HIGH (ref 150–400)
RBC: 3.21 MIL/uL — ABNORMAL LOW (ref 3.87–5.11)
RDW: 17.6 % — ABNORMAL HIGH (ref 11.5–15.5)
WBC: 10.6 K/uL — ABNORMAL HIGH (ref 4.0–10.5)
nRBC: 0.2 % (ref 0.0–0.2)

## 2023-12-03 LAB — MAGNESIUM: Magnesium: 2 mg/dL (ref 1.7–2.4)

## 2023-12-03 MED ORDER — SODIUM CHLORIDE 0.9 % IV SOLN: INTRAVENOUS | Status: DC

## 2023-12-03 MED ORDER — SODIUM CHLORIDE 0.9 % IV SOLN
1000.0000 mg | Freq: Once | INTRAVENOUS | Status: AC
  Administered 2023-12-03: 1000 mg via INTRAVENOUS
  Filled 2023-12-03: qty 1000

## 2023-12-03 MED ORDER — ACETAMINOPHEN 325 MG PO TABS
650.0000 mg | ORAL_TABLET | Freq: Once | ORAL | Status: AC
  Administered 2023-12-03: 650 mg via ORAL
  Filled 2023-12-03: qty 2

## 2023-12-03 MED ORDER — CETIRIZINE HCL 10 MG/ML IV SOLN
10.0000 mg | Freq: Once | INTRAVENOUS | Status: AC
  Administered 2023-12-03: 10 mg via INTRAVENOUS
  Filled 2023-12-03: qty 1

## 2023-12-03 NOTE — Patient Instructions (Signed)

## 2023-12-03 NOTE — Progress Notes (Signed)

## 2023-12-03 NOTE — Telephone Encounter (Signed)
 Nutrition Assessment   Reason for Assessment: New pancreatic cancer   ASSESSMENT: 73 year old female with stage II pancreatic adenocarcinoma. S/p partial pancreatectomy and splenectomy under the care of Dr. Dasie (8/25). Planning on adjuvant gemcitabine/abraxane (start 10/9). Patient is under the care of Dr. Davonna  Past medical history includes HTN, gastritis with hemorrhage, COPD, GERD, acute pancreatitis, GIST, recurrent UTI, IDA, depression, constipation, esophageal dysphagia, egg allergy  8/25-8/31 - admission for surgery  Spoke with patient via telephone. She reports ongoing discomfort s/p surgery. Appetite is good. Patient usually eats 3 meals. Breakfast is bowl of cereal, oatmeal, or grits with toast. She uses whole fat milk. Eats a sandwich (ham,turkey) for lunch. Biggest meal is dinner (sloppy joes, steak, chicken, pork chops, potatoes, vegetables). Patient drinks a lot of water. She reports baseline constipation. Bowels move every 2-3 days. She is not on bowel regimen. Last BM was yesterday.   Nutrition Focused Physical Exam: unable to complete (telephone visit)    Medications: xanax , cariprazine , B12, lexapro , lasix  20mg , zestroretic, macrobid , prilosec, zofran , roxicodone , miralax , compazine     Labs: Na 127, glucose 233, Hgb 7.0   Anthropometrics:   Height: 5' Weight: 133 lb  UBW: 140 -143 lb  BMI: 25.97   NUTRITION DIAGNOSIS: Food and nutrition related knowledge deficit related to cancer as evidenced by no prior need for associated nutrition education   INTERVENTION:  Educated on importance of adequate calorie/protein energy intake to support ongoing post-op healing, maintain strength/weights during adjuvant therapy Discussed foods with protein, recommend protein source at every meal Patient likes Ensure, suggested daily Ensure Plus/equivalent for added calories and protein  Handout, Ensure samples + coupons left for pt - to be provided at 10/9 [facility]  visit Consider bowel regimen  MONITORING, EVALUATION, GOAL: Patient will    Next Visit: Monday November 3 via telephone

## 2023-12-03 NOTE — Progress Notes (Signed)
 Patient tolerated iron infusion with no complaints voiced.  Port IV site clean and dry with good blood return noted before and after infusion.  Band aid applied.  VSS with discharge and left in satisfactory condition with no s/s of distress noted.

## 2023-12-04 ENCOUNTER — Ambulatory Visit

## 2023-12-04 ENCOUNTER — Other Ambulatory Visit: Payer: Self-pay | Admitting: Oncology

## 2023-12-04 VITALS — BP 131/54 | HR 87 | Temp 97.2°F | Resp 18

## 2023-12-04 DIAGNOSIS — D509 Iron deficiency anemia, unspecified: Secondary | ICD-10-CM

## 2023-12-04 DIAGNOSIS — D649 Anemia, unspecified: Secondary | ICD-10-CM

## 2023-12-04 DIAGNOSIS — C259 Malignant neoplasm of pancreas, unspecified: Secondary | ICD-10-CM

## 2023-12-04 DIAGNOSIS — Z5111 Encounter for antineoplastic chemotherapy: Secondary | ICD-10-CM | POA: Diagnosis not present

## 2023-12-04 LAB — PREPARE RBC (CROSSMATCH)

## 2023-12-04 MED ORDER — DIPHENHYDRAMINE HCL 25 MG PO CAPS
25.0000 mg | ORAL_CAPSULE | Freq: Once | ORAL | Status: AC
  Administered 2023-12-04: 25 mg via ORAL
  Filled 2023-12-04: qty 1

## 2023-12-04 MED ORDER — SODIUM CHLORIDE 0.9% IV SOLUTION
250.0000 mL | INTRAVENOUS | Status: AC
  Administered 2023-12-04: 100 mL via INTRAVENOUS

## 2023-12-04 MED ORDER — ACETAMINOPHEN 325 MG PO TABS
650.0000 mg | ORAL_TABLET | Freq: Once | ORAL | Status: AC
  Administered 2023-12-04: 650 mg via ORAL
  Filled 2023-12-04: qty 2

## 2023-12-04 NOTE — Patient Instructions (Signed)
 [facility] [place] - A DEPT OF . [facility]  Discharge Instructions: Thank you for choosing [facility] to provide your oncology and hematology care.  If you have a lab appointment with the Cancer Center - please note that after April 8th, 2024, all labs will be drawn in the cancer center.  You do not have to check in or register with the main entrance as you have in the past but will complete your check-in in the cancer center.  Wear comfortable clothing and clothing appropriate for easy access to any Portacath or PICC line.   We strive to give you quality time with your provider. You may need to reschedule your appointment if you arrive late (15 or more minutes).  Arriving late affects you and other patients whose appointments are after yours.  Also, if you miss three or more appointments without notifying the office, you may be dismissed from the clinic at the provider's discretion.      For prescription refill requests, have your pharmacy contact our office and allow 72 hours for refills to be completed.    Today you received the following: one unit of blood today   To help prevent nausea and vomiting after your treatment, we encourage you to take your nausea medication as directed.  BELOW ARE SYMPTOMS THAT SHOULD BE REPORTED IMMEDIATELY: *FEVER GREATER THAN 100.4 F (38 C) OR HIGHER *CHILLS OR SWEATING *NAUSEA AND VOMITING THAT IS NOT CONTROLLED WITH YOUR NAUSEA MEDICATION *UNUSUAL SHORTNESS OF BREATH *UNUSUAL BRUISING OR BLEEDING *URINARY PROBLEMS (pain or burning when urinating, or frequent urination) *BOWEL PROBLEMS (unusual diarrhea, constipation, pain near the anus) TENDERNESS IN MOUTH AND THROAT WITH OR WITHOUT PRESENCE OF ULCERS (sore throat, sores in mouth, or a toothache) UNUSUAL RASH, SWELLING OR PAIN  UNUSUAL VAGINAL DISCHARGE OR ITCHING   Items with * indicate a potential emergency and should be followed up as soon as possible or go to  the Emergency Department if any problems should occur.  Please show the CHEMOTHERAPY ALERT CARD or IMMUNOTHERAPY ALERT CARD at check-in to the Emergency Department and triage nurse.  Should you have questions after your visit or need to cancel or reschedule your appointment, please contact [facility] [place] - A DEPT OF [facility] [phone]  and follow the prompts.  Office hours are 8:00 a.m. to 4:30 p.m. Monday - Friday. Please note that voicemails left after 4:00 p.m. may not be returned until the following business day.  We are closed weekends and major holidays. You have access to a nurse at all times for urgent questions. Please call the main number to the clinic [phone] and follow the prompts.  For any non-urgent questions, you may also contact your provider using MyChart. We now offer e-Visits for anyone 96 and older to request care online for non-urgent symptoms. For details visit [email or web address].   Also download the MyChart app! Go to the app store, search MyChart, open the app, select [place], and log in with your MyChart username and password.

## 2023-12-04 NOTE — Progress Notes (Unsigned)
 1 unit of blood given per orders. Patient tolerated it well without problems. Vitals stable and discharged home from clinic ambulatory. Follow up as scheduled.

## 2023-12-05 LAB — BPAM RBC
Blood Product Expiration Date: 202511052359
ISSUE DATE / TIME: 202510070955
Unit Type and Rh: 5100

## 2023-12-05 LAB — TYPE AND SCREEN
ABO/RH(D): O POS
Antibody Screen: NEGATIVE
Unit division: 0

## 2023-12-06 ENCOUNTER — Inpatient Hospital Stay

## 2023-12-06 VITALS — BP 151/67 | HR 80 | Temp 97.9°F | Resp 18 | Wt 138.2 lb

## 2023-12-06 DIAGNOSIS — Z5111 Encounter for antineoplastic chemotherapy: Secondary | ICD-10-CM | POA: Diagnosis not present

## 2023-12-06 DIAGNOSIS — C259 Malignant neoplasm of pancreas, unspecified: Secondary | ICD-10-CM

## 2023-12-06 MED ORDER — PACLITAXEL PROTEIN-BOUND CHEMO INJECTION 100 MG
125.0000 mg/m2 | Freq: Once | INTRAVENOUS | Status: AC
  Administered 2023-12-06: 200 mg via INTRAVENOUS
  Filled 2023-12-06: qty 40

## 2023-12-06 MED ORDER — PROCHLORPERAZINE MALEATE 10 MG PO TABS
10.0000 mg | ORAL_TABLET | Freq: Once | ORAL | Status: AC
  Administered 2023-12-06: 10 mg via ORAL
  Filled 2023-12-06: qty 1

## 2023-12-06 MED ORDER — SODIUM CHLORIDE 0.9 % IV SOLN: INTRAVENOUS | Status: DC

## 2023-12-06 MED ORDER — SODIUM CHLORIDE 0.9 % IV SOLN
1000.0000 mg/m2 | Freq: Once | INTRAVENOUS | Status: AC
  Administered 2023-12-06: 1596 mg via INTRAVENOUS
  Filled 2023-12-06: qty 41.98

## 2023-12-06 NOTE — Progress Notes (Signed)
 Patient presents today for C1D1 Abraxane/Gemzar infusion.  Patient is in satisfactory condition with no new complaints voiced.  Vital signs are stable.  Labs reviewed on 12/03/23 and all labs are within treatment parameters.  We will proceed with treatment per MD orders.    Treatment given today per MD orders. Tolerated infusion without adverse affects. Vital signs stable. No complaints at this time. Discharged from clinic ambulatory in stable condition. Alert and oriented x 3. F/U with [facility] as scheduled.

## 2023-12-06 NOTE — Patient Instructions (Signed)
 [facility] [place] - A DEPT OF [place]. [facility]  Discharge Instructions: Thank you for choosing [facility] to provide your oncology and hematology care.  If you have a lab appointment with the Cancer Center - please note that after April 8th, 2024, all labs will be drawn in the cancer center.  You do not have to check in or register with the main entrance as you have in the past but will complete your check-in in the cancer center.  Wear comfortable clothing and clothing appropriate for easy access to any Portacath or PICC line.   We strive to give you quality time with your provider. You may need to reschedule your appointment if you arrive late (15 or more minutes).  Arriving late affects you and other patients whose appointments are after yours.  Also, if you miss three or more appointments without notifying the office, you may be dismissed from the clinic at the provider's discretion.      For prescription refill requests, have your pharmacy contact our office and allow 72 hours for refills to be completed.    Today you received the following chemotherapy and/or immunotherapy agents C1D1 Abraxane/Gemzar      To help prevent nausea and vomiting after your treatment, we encourage you to take your nausea medication as directed.  Paclitaxel Nanoparticle Albumin -Bound Injection What is this medication? NANOPARTICLE ALBUMIN -BOUND PACLITAXEL (Na no PAHR ti kuhl al BYOO muhn-bound PAK li TAX el) treats some types of cancer. It works by slowing down the growth of cancer cells. This medicine may be used for other purposes; ask your health care provider or pharmacist if you have questions. COMMON BRAND NAME(S): Abraxane What should I tell my care team before I take this medication? They need to know if you have any of these conditions: Liver disease Low white blood cell levels An unusual or allergic reaction to paclitaxel, albumin , other medications, foods, dyes,  or preservatives If you or your partner are pregnant or trying to get pregnant Breast-feeding How should I use this medication? This medication is injected into a vein. It is given by your care team in a hospital or clinic setting. Talk to your care team about the use of this medication in children. Special care may be needed. Overdosage: If you think you have taken too much of this medicine contact a poison control center or emergency room at once. NOTE: This medicine is only for you. Do not share this medicine with others. What if I miss a dose? Keep appointments for follow-up doses. It is important not to miss your dose. Call your care team if you are unable to keep an appointment. What may interact with this medication? Other medications may affect the way this medication works. Talk with your care team about all of the medications you take. They may suggest changes to your treatment plan to lower the risk of side effects and to make sure your medications work as intended. This list may not describe all possible interactions. Give your health care provider a list of all the medicines, herbs, non-prescription drugs, or dietary supplements you use. Also tell them if you smoke, drink alcohol, or use illegal drugs. Some items may interact with your medicine. What should I watch for while using this medication? Your condition will be monitored carefully while you are receiving this medication. You may need blood work while taking this medication. This medication may make you feel generally unwell. This is not  uncommon as chemotherapy can affect healthy cells as well as cancer cells. Report any side effects. Continue your course of treatment even though you feel ill unless your care team tells you to stop. This medication can cause serious allergic reactions. To reduce the risk, your care team may give you other medications to take before receiving this one. Be sure to follow the directions from your  care team. This medication may increase your risk of getting an infection. Call your care team for advice if you get a fever, chills, sore throat, or other symptoms of a cold or flu. Do not treat yourself. Try to avoid being around people who are sick. This medication may increase your risk to bruise or bleed. Call your care team if you notice any unusual bleeding. Be careful brushing or flossing your teeth or using a toothpick because you may get an infection or bleed more easily. If you have any dental work done, tell your dentist you are receiving this medication. Talk to your care team if you or your partner may be pregnant. Serious birth defects can occur if you take this medication during pregnancy and for 6 months after the last dose. You will need a negative pregnancy test before starting this medication. Contraception is recommended while taking this medication and for 6 months after the last dose. Your care team can help you find the option that works for you. If your partner can get pregnant, use a condom during sex while taking this medication and for 3 months after the last dose. Do not breastfeed while taking this medication and for 2 weeks after the last dose. This medication may cause infertility. Talk to your care team if you are concerned about your fertility. What side effects may I notice from receiving this medication? Side effects that you should report to your care team as soon as possible: Allergic reactions--skin rash, itching, hives, swelling of the face, lips, tongue, or throat Dry cough, shortness of breath or trouble breathing Infection--fever, chills, cough, sore throat, wounds that don't heal, pain or trouble when passing urine, general feeling of discomfort or being unwell Low red blood cell level--unusual weakness or fatigue, dizziness, headache, trouble breathing Pain, tingling, or numbness in the hands or feet Stomach pain, unusual weakness or fatigue, nausea,  vomiting, diarrhea, or fever that lasts longer than expected Unusual bruising or bleeding Side effects that usually do not require medical attention (report to your care team if they continue or are bothersome): Diarrhea Fatigue Hair loss Loss of appetite Nausea Vomiting This list may not describe all possible side effects. Call your doctor for medical advice about side effects. You may report side effects to FDA at 1-800-FDA-1088. Where should I keep my medication? This medication is given in a hospital or clinic. It will not be stored at home. NOTE: This sheet is a summary. It may not cover all possible information. If you have questions about this medicine, talk to your doctor, pharmacist, or health care provider.  2024 Elsevier/Gold Standard (2021-06-30 00:00:00)   Gemcitabine Injection What is this medication? GEMCITABINE (jem SYE ta been) treats some types of cancer. It works by slowing down the growth of cancer cells. This medicine may be used for other purposes; ask your health care provider or pharmacist if you have questions. COMMON BRAND NAME(S): Gemzar, Infugem What should I tell my care team before I take this medication? They need to know if you have any of these conditions: Blood disorders Infection Kidney disease  Liver disease Lung or breathing disease, such as asthma or COPD Recent or ongoing radiation therapy An unusual or allergic reaction to gemcitabine, other medications, foods, dyes, or preservatives If you or your partner are pregnant or trying to get pregnant Breast-feeding How should I use this medication? This medication is injected into a vein. It is given by your care team in a hospital or clinic setting. Talk to your care team about the use of this medication in children. Special care may be needed. Overdosage: If you think you have taken too much of this medicine contact a poison control center or emergency room at once. NOTE: This medicine is only  for you. Do not share this medicine with others. What if I miss a dose? Keep appointments for follow-up doses. It is important not to miss your dose. Call your care team if you are unable to keep an appointment. What may interact with this medication? Interactions have not been studied. This list may not describe all possible interactions. Give your health care provider a list of all the medicines, herbs, non-prescription drugs, or dietary supplements you use. Also tell them if you smoke, drink alcohol, or use illegal drugs. Some items may interact with your medicine. What should I watch for while using this medication? Your condition will be monitored carefully while you are receiving this medication. This medication may make you feel generally unwell. This is not uncommon, as chemotherapy can affect healthy cells as well as cancer cells. Report any side effects. Continue your course of treatment even though you feel ill unless your care team tells you to stop. In some cases, you may be given additional medications to help with side effects. Follow all directions for their use. This medication may increase your risk of getting an infection. Call your care team for advice if you get a fever, chills, sore throat, or other symptoms of a cold or flu. Do not treat yourself. Try to avoid being around people who are sick. This medication may increase your risk to bruise or bleed. Call your care team if you notice any unusual bleeding. Be careful brushing or flossing your teeth or using a toothpick because you may get an infection or bleed more easily. If you have any dental work done, tell your dentist you are receiving this medication. Avoid taking medications that contain aspirin , acetaminophen , ibuprofen, naproxen , or ketoprofen unless instructed by your care team. These medications may hide a fever. Talk to your care team if you or your partner wish to become pregnant or think you might be pregnant. This  medication can cause serious birth defects if taken during pregnancy and for 6 months after the last dose. A negative pregnancy test is required before starting this medication. A reliable form of contraception is recommended while taking this medication and for 6 months after the last dose. Talk to your care team about effective forms of contraception. Do not father a child while taking this medication and for 3 months after the last dose. Use a condom while having sex during this time period. Do not breastfeed while taking this medication and for at least 1 week after the last dose. This medication may cause infertility. Talk to your care team if you are concerned about your fertility. What side effects may I notice from receiving this medication? Side effects that you should report to your care team as soon as possible: Allergic reactions--skin rash, itching, hives, swelling of the face, lips, tongue, or throat Capillary leak  syndrome--stomach or muscle pain, unusual weakness or fatigue, feeling faint or lightheaded, decrease in the amount of urine, swelling of the ankles, hands, or feet, trouble breathing Infection--fever, chills, cough, sore throat, wounds that don't heal, pain or trouble when passing urine, general feeling of discomfort or being unwell Liver injury--right upper belly pain, loss of appetite, nausea, light-colored stool, dark yellow or brown urine, yellowing skin or eyes, unusual weakness or fatigue Low red blood cell level--unusual weakness or fatigue, dizziness, headache, trouble breathing Lung injury--shortness of breath or trouble breathing, cough, spitting up blood, chest pain, fever Stomach pain, bloody diarrhea, pale skin, unusual weakness or fatigue, decrease in the amount of urine, which may be signs of hemolytic uremic syndrome Sudden and severe headache, confusion, change in vision, seizures, which may be signs of posterior reversible encephalopathy syndrome  (PRES) Unusual bruising or bleeding Side effects that usually do not require medical attention (report to your care team if they continue or are bothersome): Diarrhea Drowsiness Hair loss Nausea Pain, redness, or swelling with sores inside the mouth or throat Vomiting This list may not describe all possible side effects. Call your doctor for medical advice about side effects. You may report side effects to FDA at 1-800-FDA-1088. Where should I keep my medication? This medication is given in a hospital or clinic. It will not be stored at home. NOTE: This sheet is a summary. It may not cover all possible information. If you have questions about this medicine, talk to your doctor, pharmacist, or health care provider.  2024 Elsevier/Gold Standard (2021-06-21 00:00:00)  BELOW ARE SYMPTOMS THAT SHOULD BE REPORTED IMMEDIATELY: *FEVER GREATER THAN 100.4 F (38 C) OR HIGHER *CHILLS OR SWEATING *NAUSEA AND VOMITING THAT IS NOT CONTROLLED WITH YOUR NAUSEA MEDICATION *UNUSUAL SHORTNESS OF BREATH *UNUSUAL BRUISING OR BLEEDING *URINARY PROBLEMS (pain or burning when urinating, or frequent urination) *BOWEL PROBLEMS (unusual diarrhea, constipation, pain near the anus) TENDERNESS IN MOUTH AND THROAT WITH OR WITHOUT PRESENCE OF ULCERS (sore throat, sores in mouth, or a toothache) UNUSUAL RASH, SWELLING OR PAIN  UNUSUAL VAGINAL DISCHARGE OR ITCHING   Items with * indicate a potential emergency and should be followed up as soon as possible or go to the Emergency Department if any problems should occur.  Please show the CHEMOTHERAPY ALERT CARD or IMMUNOTHERAPY ALERT CARD at check-in to the Emergency Department and triage nurse.  Should you have questions after your visit or need to cancel or reschedule your appointment, please contact [facility] [place] - A DEPT OF [facility] [phone]  and follow the prompts.  Office hours are 8:00 a.m. to 4:30 p.m. Monday - Friday. Please  note that voicemails left after 4:00 p.m. may not be returned until the following business day.  We are closed weekends and major holidays. You have access to a nurse at all times for urgent questions. Please call the main number to the clinic [phone] and follow the prompts.  For any non-urgent questions, you may also contact your provider using MyChart. We now offer e-Visits for anyone 5 and older to request care online for non-urgent symptoms. For details visit [email or web address].   Also download the MyChart app! Go to the app store, search MyChart, open the app, select [place], and log in with your MyChart username and password.

## 2023-12-07 NOTE — Progress Notes (Signed)
 24 hour follow up-got sick and vomited once last night, took nausea pill and felt better. Encouraged pt to drink plenty of fluids today . Pt understood. No other issues at this time.

## 2023-12-17 ENCOUNTER — Ambulatory Visit: Payer: Self-pay | Admitting: Licensed Clinical Social Worker

## 2023-12-17 ENCOUNTER — Encounter: Payer: Self-pay | Admitting: Licensed Clinical Social Worker

## 2023-12-17 ENCOUNTER — Telehealth: Payer: Self-pay | Admitting: Licensed Clinical Social Worker

## 2023-12-17 DIAGNOSIS — Z1379 Encounter for other screening for genetic and chromosomal anomalies: Secondary | ICD-10-CM

## 2023-12-17 NOTE — Progress Notes (Signed)
 HPI:   [name]. [name] [facility] due to a personal and family history of cancer and concerns regarding a hereditary predisposition to cancer. Please refer to our prior cancer genetics clinic note for more information regarding our discussion, assessment and recommendations, at the time. [name]. [name] recent genetic test results were disclosed to her, as were recommendations warranted by these results. These results and recommendations are discussed in more detail below.  CANCER HISTORY:  Oncology History  Pancreatic adenocarcinoma (HCC)  08/08/2023 Imaging   CT abdomen and pelvis with and without contrast:  IMPRESSION: 1. 2.1 cm hypoenhancing mass within the mid body of the pancreas resulting in mild dilation of the main pancreatic duct within the tail the pancreas and asymmetric pancreatic parenchymal atrophy within this region. These findings are suspicious for a pancreatic adenocarcinoma. Endoscopic ultrasound and biopsy is advised for further evaluation. 2. No evidence regional adenopathy or distant metastatic disease within the abdomen and pelvis. 3. Small hiatal hernia. 4. Status post umbilical hernia repair with mesh.     09/06/2023 Pathology Results   A. PANCREATIC, BODY LESION, FINE NEEDLE ASPIRATION   FINAL MICROSCOPIC DIAGNOSIS:  - Adenocarcinoma    09/17/2023 Initial Diagnosis   Pancreatic adenocarcinoma (HCC)   09/17/2023 Tumor Marker   CA 19-9: 23- Normal   09/23/2023 Imaging   CT chest w contrast:  IMPRESSION: 1. Scattered tiny pulmonary nodules measuring up to 2 mm, nonspecific but favored benign. Consider short-term interval follow-up dedicated chest CT. 2. No convincing evidence of metastatic disease in the chest. 3. Known pancreatic lesion better assessed on CT pancreas protocol of the abdomen August 08, 2023. 4. Aortic atherosclerosis.   11/26/2023 Cancer Staging   Staging form: Exocrine Pancreas, AJCC 8th  Edition - Pathologic stage from 11/26/2023: Stage Unknown (pTX, pN1, cM0) - Signed by Davonna Siad, MD on 11/26/2023 Stage prefix: Initial diagnosis   12/06/2023 -  Chemotherapy   Patient is on Treatment Plan : PANCREATIC Abraxane D1,8,15 + Gemcitabine D1,8,15 q28d      Genetic Testing   No pathogenic variants identified on the [facility] CancerNext-Expanded+RNA Panel. VUS in ATM called p. [id number] and TMEM127 called p.[id number] identified. The report date is 12/13/2023.  The CancerNext-Expanded gene panel offered by [facility] and includes sequencing, rearrangement, and RNA analysis for the following 77 genes: AIP, ALK, APC, ATM, AXIN2, BAP1, BARD1, BMPR1A, BRCA1, BRCA2, BRIP1, CDC73, CDH1, CDK4, CDKN1B, CDKN2A, CEBPA, CHEK2, CTNNA1, DDX41, DICER1, ETV6, FH, FLCN, GATA2, LZTR1, MAX, MBD4, MEN1, MET, MLH1, MSH2, MSH3, MSH6, MUTYH, NF1, NF2, NTHL1, PALB2, PHOX2B, PMS2, POT1, PRKAR1A, PTCH1, PTEN, RAD51C, RAD51D, RB1, RET, RPS20, RUNX1, SDHA, SDHAF2, SDHB, SDHC, SDHD, SMAD4, SMARCA4, SMARCB1, SMARCE1, STK11, SUFU, TMEM127, TP53, TSC1, TSC2, VHL, and WT1 (sequencing and deletion/duplication); EGFR, HOXB13, KIT, MITF, PDGFRA, POLD1, and POLE (sequencing only); EPCAM and GREM1 (deletion/duplication only).    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  Significant diagnoses are listed below: Family History  Problem Relation Age of Onset   Lung  cancer Mother    Prostate cancer  Brother        metastatic   Melanoma Son    Colon cancer Neg Hx    Pancreatic disease Neg Hx      [name]. [name] has 2 sons, 19 and 58. Her oldest son has had melanoma on his arm removed recently. Patient had 1 brother who passed earlier this year at 22 of metastatic prostate cancer.   [name]. [name] mother had lung cancer and history of smoking, she passed at 56. No other known cancers in the family.   [name]. [name] is unaware of previous family history of genetic testing for hereditary cancer risks. There is no reported Ashkenazi Jewish ancestry. There is no known consanguinity.      GENETIC TEST RESULTS:  The [facility] CancerNext-Expanded+RNA Panel found no pathogenic mutations.   The CancerNext-Expanded gene panel offered by [facility] and includes sequencing, rearrangement, and RNA analysis for the following 77 genes: AIP, ALK, APC, ATM, AXIN2, BAP1, BARD1, BMPR1A, BRCA1, BRCA2, BRIP1, CDC73, CDH1, CDK4, CDKN1B, CDKN2A, CEBPA, CHEK2, CTNNA1, DDX41, DICER1, ETV6, FH, FLCN, GATA2, LZTR1, MAX, MBD4, MEN1, MET, MLH1, MSH2, MSH3, MSH6, MUTYH, NF1, NF2, NTHL1, PALB2, PHOX2B, PMS2, POT1, PRKAR1A, PTCH1, PTEN, RAD51C, RAD51D, RB1, RET, RPS20, RUNX1, SDHA, SDHAF2, SDHB, SDHC, SDHD, SMAD4, SMARCA4, SMARCB1, SMARCE1, STK11, SUFU, TMEM127, TP53, TSC1, TSC2, VHL, and WT1 (sequencing and deletion/duplication); EGFR, HOXB13, KIT, MITF, PDGFRA, POLD1, and POLE (sequencing only); EPCAM and GREM1 (deletion/duplication only).   The test report has been scanned into EPIC and is located under the Molecular Pathology section of the Results Review tab.  A portion of the result report is included below for reference. Genetic testing reported out on 12/13/2023.      Genetic testing identified a variant of uncertain significance (VUS) in the ATM and TMEM127 genes At this time, it is unknown if these variants are associated with an increased risk for cancer or if it is benign, but most uncertain variants are  reclassified to benign. They should not be used to make medical management decisions. With time, we suspect the laboratory will determine the significance of these variants, if any. If the laboratory reclassifies this variant, we will attempt to contact [name]. Zawadzki to discuss it further.   Even though a pathogenic variant was not identified, possible explanations for the cancer in the family may include: There may be no hereditary risk for cancer in the family. The cancers in [name]. Roorda and/or her family may be sporadic/familial or due to other genetic and environmental factors. There may be a gene mutation in one of these genes that current testing methods cannot detect but that chance is small. There could be another gene that has not yet been discovered, or that we have not yet tested, that is responsible for the cancer diagnoses in the family.  It is also possible there is a hereditary cause for the cancer in the family that [name]. Mikes did not inherit.  Therefore, it is important to remain in touch with cancer genetics in the future so that we can continue to offer [name]. Ikard the most up to date genetic testing.   ADDITIONAL GENETIC TESTING:  We discussed with [name]. Vandergriff that her genetic testing was fairly extensive.  If there are additional relevant genes identified to increase cancer risk that can be analyzed in the future, we would be happy to discuss and coordinate this testing at that time.    CANCER SCREENING RECOMMENDATIONS:  [name]. Brigandi test result  is considered negative (normal).  This means that we have not identified a hereditary cause for her personal and family history of cancer at this time.   An individual's cancer risk and medical management are not determined by genetic test results alone. Overall cancer risk assessment incorporates additional factors, including personal medical history, family history, and any available genetic information that may result  in a personalized plan for cancer prevention and surveillance. Therefore, it is recommended she continue to follow the cancer management and screening guidelines provided by her oncology and primary healthcare provider.  RECOMMENDATIONS FOR FAMILY MEMBERS:   Since she did not inherit a identifiable mutation in a cancer predisposition gene included on this panel, her children could not have inherited a known mutation from her in one of these genes. Individuals in this family might be at some increased risk of developing cancer, over the general population risk, due to the family history of cancer.  Individuals in the family should notify their providers of the family history of cancer. We recommend women in this family have a yearly mammogram beginning at age 29, or 76 years younger than the earliest onset of cancer, an annual clinical breast exam, and perform monthly breast self-exams.  Family members should have colonoscopies by at age 108, or earlier, as recommended by their providers. We do not recommend familial testing for the ATM or TMEM127 variant of uncertain significance (VUS).  FOLLOW-UP:  Lastly, we discussed with [name]. Riesen that cancer genetics is a rapidly advancing field and it is possible that new genetic tests will be appropriate for her and/or her family members in the future. We encouraged her to remain in contact with cancer genetics on an annual basis so we can update her personal and family histories and let her know of advances in cancer genetics that may benefit this family.   Our contact number was provided. [name]. Curvin questions were answered to her satisfaction, and she knows she is welcome to call us  at anytime with additional questions or concerns.    Patty Cary, [name], [name] [name] Phone: [phone]

## 2023-12-17 NOTE — Telephone Encounter (Signed)
 I contacted Ms. Cryder to discuss her genetic testing results. No pathogenic variants were identified in the 77 genes analyzed. Detailed clinic note to follow.   The test report has been scanned into EPIC and is located under the Molecular Pathology section of the Results Review tab.  A portion of the result report is included below for reference.      Dena Cary, MS, [facility] Genetic Counselor [email or web address] Phone: [phone]

## 2023-12-20 ENCOUNTER — Inpatient Hospital Stay

## 2023-12-20 ENCOUNTER — Inpatient Hospital Stay (HOSPITAL_BASED_OUTPATIENT_CLINIC_OR_DEPARTMENT_OTHER): Admitting: Oncology

## 2023-12-20 VITALS — Ht <= 58 in | Wt 136.0 lb

## 2023-12-20 VITALS — BP 159/72 | HR 72 | Temp 98.1°F | Resp 16

## 2023-12-20 DIAGNOSIS — C259 Malignant neoplasm of pancreas, unspecified: Secondary | ICD-10-CM

## 2023-12-20 DIAGNOSIS — N3000 Acute cystitis without hematuria: Secondary | ICD-10-CM

## 2023-12-20 DIAGNOSIS — D509 Iron deficiency anemia, unspecified: Secondary | ICD-10-CM | POA: Diagnosis not present

## 2023-12-20 DIAGNOSIS — Z5111 Encounter for antineoplastic chemotherapy: Secondary | ICD-10-CM | POA: Diagnosis not present

## 2023-12-20 DIAGNOSIS — R300 Dysuria: Secondary | ICD-10-CM

## 2023-12-20 DIAGNOSIS — C49A4 Gastrointestinal stromal tumor of large intestine: Secondary | ICD-10-CM

## 2023-12-20 LAB — CBC WITH DIFFERENTIAL/PLATELET
Abs Immature Granulocytes: 0.03 K/uL (ref 0.00–0.07)
Basophils Absolute: 0.2 K/uL — ABNORMAL HIGH (ref 0.0–0.1)
Basophils Relative: 2 %
Eosinophils Absolute: 0.4 K/uL (ref 0.0–0.5)
Eosinophils Relative: 4 %
HCT: 35.8 % — ABNORMAL LOW (ref 36.0–46.0)
Hemoglobin: 10.9 g/dL — ABNORMAL LOW (ref 12.0–15.0)
Immature Granulocytes: 0 %
Lymphocytes Relative: 34 %
Lymphs Abs: 3.4 K/uL (ref 0.7–4.0)
MCH: 25.2 pg — ABNORMAL LOW (ref 26.0–34.0)
MCHC: 30.4 g/dL (ref 30.0–36.0)
MCV: 82.7 fL (ref 80.0–100.0)
Monocytes Absolute: 1.3 K/uL — ABNORMAL HIGH (ref 0.1–1.0)
Monocytes Relative: 13 %
Neutro Abs: 4.8 K/uL (ref 1.7–7.7)
Neutrophils Relative %: 47 %
Platelets: 581 K/uL — ABNORMAL HIGH (ref 150–400)
RBC: 4.33 MIL/uL (ref 3.87–5.11)
RDW: 27.2 % — ABNORMAL HIGH (ref 11.5–15.5)
Smear Review: NORMAL
WBC: 10 K/uL (ref 4.0–10.5)
nRBC: 0.2 % (ref 0.0–0.2)

## 2023-12-20 LAB — URINALYSIS, ROUTINE W REFLEX MICROSCOPIC
Bilirubin Urine: NEGATIVE
Glucose, UA: NEGATIVE mg/dL
Hgb urine dipstick: NEGATIVE
Ketones, ur: NEGATIVE mg/dL
Nitrite: NEGATIVE
Protein, ur: NEGATIVE mg/dL
Specific Gravity, Urine: 1.004 — ABNORMAL LOW (ref 1.005–1.030)
WBC, UA: >50 WBC/hpf (ref 0–5)
pH: 7 (ref 5.0–8.0)

## 2023-12-20 LAB — COMPREHENSIVE METABOLIC PANEL WITH GFR
ALT: 14 U/L (ref 0–44)
AST: 15 U/L (ref 15–41)
Albumin: 4.2 g/dL (ref 3.5–5.0)
Alkaline Phosphatase: 84 U/L (ref 38–126)
Anion gap: 9 (ref 5–15)
BUN: 8 mg/dL (ref 8–23)
CO2: 28 mmol/L (ref 22–32)
Calcium: 8.7 mg/dL — ABNORMAL LOW (ref 8.9–10.3)
Chloride: 95 mmol/L — ABNORMAL LOW (ref 98–111)
Creatinine, Ser: 0.48 mg/dL (ref 0.44–1.00)
GFR, Estimated: >60 mL/min (ref >60)
Glucose, Bld: 132 mg/dL — ABNORMAL HIGH (ref 70–99)
Potassium: 4.2 mmol/L (ref 3.5–5.1)
Sodium: 132 mmol/L — ABNORMAL LOW (ref 135–145)
Total Bilirubin: 0.2 mg/dL (ref 0.0–1.2)
Total Protein: 6.7 g/dL (ref 6.5–8.1)

## 2023-12-20 LAB — MAGNESIUM: Magnesium: 2.4 mg/dL (ref 1.7–2.4)

## 2023-12-20 MED ORDER — SODIUM CHLORIDE 0.9 % IV SOLN: INTRAVENOUS | Status: DC

## 2023-12-20 MED ORDER — PACLITAXEL PROTEIN-BOUND CHEMO INJECTION 100 MG
125.0000 mg/m2 | Freq: Once | INTRAVENOUS | Status: AC
  Administered 2023-12-20: 200 mg via INTRAVENOUS
  Filled 2023-12-20: qty 40

## 2023-12-20 MED ORDER — SODIUM CHLORIDE 0.9 % IV SOLN
1000.0000 mg/m2 | Freq: Once | INTRAVENOUS | Status: AC
  Administered 2023-12-20: 1596 mg via INTRAVENOUS
  Filled 2023-12-20: qty 41.98

## 2023-12-20 MED ORDER — PROCHLORPERAZINE MALEATE 10 MG PO TABS
10.0000 mg | ORAL_TABLET | Freq: Once | ORAL | Status: AC
  Administered 2023-12-20: 10 mg via ORAL
  Filled 2023-12-20: qty 1

## 2023-12-20 NOTE — Progress Notes (Signed)
 Patient has been examined by Dr. Davonna. Vital signs and labs have been reviewed by MD - ANC, Creatinine, LFTs, hemoglobin, and platelets have been reviewed by M.D. - pt may proceed with treatment.  Primary RN and pharmacy notified.

## 2023-12-20 NOTE — Patient Instructions (Signed)

## 2023-12-20 NOTE — Patient Instructions (Signed)
 [facility] [place] - A DEPT OF [facility]  Discharge Instructions: Thank you for choosing [facility] to provide your oncology and hematology care.  If you have a lab appointment with the Cancer Center - please note that after April 8th, 2024, all labs will be drawn in the cancer center.  You do not have to check in or register with the main entrance as you have in the past but will complete your check-in in the cancer center.  Wear comfortable clothing and clothing appropriate for easy access to any Portacath or PICC line.   We strive to give you quality time with your provider. You may need to reschedule your appointment if you arrive late (15 or more minutes).  Arriving late affects you and other patients whose appointments are after yours.  Also, if you miss three or more appointments without notifying the office, you may be dismissed from the clinic at the provider's discretion.      For prescription refill requests, have your pharmacy contact our office and allow 72 hours for refills to be completed.    Today you received the following chemotherapy and/or immunotherapy agents Abraxane/Gemzar   To help prevent nausea and vomiting after your treatment, we encourage you to take your nausea medication as directed.  Paclitaxel Nanoparticle Albumin-Bound Injection What is this medication? NANOPARTICLE ALBUMIN-BOUND PACLITAXEL (Na no PAHR ti kuhl al BYOO muhn-bound PAK li TAX el) treats some types of cancer. It works by slowing down the growth of cancer cells. This medicine may be used for other purposes; ask your health care provider or pharmacist if you have questions. COMMON BRAND NAME(S): Abraxane What should I tell my care team before I take this medication? They need to know if you have any of these conditions: Liver disease Low white blood cell levels An unusual or allergic reaction to paclitaxel, albumin, other medications, foods, dyes, or  preservatives If you or your partner are pregnant or trying to get pregnant Breast-feeding How should I use this medication? This medication is injected into a vein. It is given by your care team in a hospital or clinic setting. Talk to your care team about the use of this medication in children. Special care may be needed. Overdosage: If you think you have taken too much of this medicine contact a poison control center or emergency room at once. NOTE: This medicine is only for you. Do not share this medicine with others. What if I miss a dose? Keep appointments for follow-up doses. It is important not to miss your dose. Call your care team if you are unable to keep an appointment. What may interact with this medication? Other medications may affect the way this medication works. Talk with your care team about all of the medications you take. They may suggest changes to your treatment plan to lower the risk of side effects and to make sure your medications work as intended. This list may not describe all possible interactions. Give your health care provider a list of all the medicines, herbs, non-prescription drugs, or dietary supplements you use. Also tell them if you smoke, drink alcohol, or use illegal drugs. Some items may interact with your medicine. What should I watch for while using this medication? Your condition will be monitored carefully while you are receiving this medication. You may need blood work while taking this medication. This medication may make you feel generally unwell. This is not uncommon as chemotherapy can  affect healthy cells as well as cancer cells. Report any side effects. Continue your course of treatment even though you feel ill unless your care team tells you to stop. This medication can cause serious allergic reactions. To reduce the risk, your care team may give you other medications to take before receiving this one. Be sure to follow the directions from your care  team. This medication may increase your risk of getting an infection. Call your care team for advice if you get a fever, chills, sore throat, or other symptoms of a cold or flu. Do not treat yourself. Try to avoid being around people who are sick. This medication may increase your risk to bruise or bleed. Call your care team if you notice any unusual bleeding. Be careful brushing or flossing your teeth or using a toothpick because you may get an infection or bleed more easily. If you have any dental work done, tell your dentist you are receiving this medication. Talk to your care team if you or your partner may be pregnant. Serious birth defects can occur if you take this medication during pregnancy and for 6 months after the last dose. You will need a negative pregnancy test before starting this medication. Contraception is recommended while taking this medication and for 6 months after the last dose. Your care team can help you find the option that works for you. If your partner can get pregnant, use a condom during sex while taking this medication and for 3 months after the last dose. Do not breastfeed while taking this medication and for 2 weeks after the last dose. This medication may cause infertility. Talk to your care team if you are concerned about your fertility. What side effects may I notice from receiving this medication? Side effects that you should report to your care team as soon as possible: Allergic reactions--skin rash, itching, hives, swelling of the face, lips, tongue, or throat Dry cough, shortness of breath or trouble breathing Infection--fever, chills, cough, sore throat, wounds that don't heal, pain or trouble when passing urine, general feeling of discomfort or being unwell Low red blood cell level--unusual weakness or fatigue, dizziness, headache, trouble breathing Pain, tingling, or numbness in the hands or feet Stomach pain, unusual weakness or fatigue, nausea, vomiting,  diarrhea, or fever that lasts longer than expected Unusual bruising or bleeding Side effects that usually do not require medical attention (report to your care team if they continue or are bothersome): Diarrhea Fatigue Hair loss Loss of appetite Nausea Vomiting This list may not describe all possible side effects. Call your doctor for medical advice about side effects. You may report side effects to FDA at 1-800-FDA-1088. Where should I keep my medication? This medication is given in a hospital or clinic. It will not be stored at home. NOTE: This sheet is a summary. It may not cover all possible information. If you have questions about this medicine, talk to your doctor, pharmacist, or health care provider.  2024 Elsevier/Gold Standard (2021-06-30 00:00:00)  Gemcitabine Injection What is this medication? GEMCITABINE (jem SYE ta been) treats some types of cancer. It works by slowing down the growth of cancer cells. This medicine may be used for other purposes; ask your health care provider or pharmacist if you have questions. COMMON BRAND NAME(S): Gemzar, Infugem What should I tell my care team before I take this medication? They need to know if you have any of these conditions: Blood disorders Infection Kidney disease Liver disease Lung or breathing  disease, such as asthma or COPD Recent or ongoing radiation therapy An unusual or allergic reaction to gemcitabine, other medications, foods, dyes, or preservatives If you or your partner are pregnant or trying to get pregnant Breast-feeding How should I use this medication? This medication is injected into a vein. It is given by your care team in a hospital or clinic setting. Talk to your care team about the use of this medication in children. Special care may be needed. Overdosage: If you think you have taken too much of this medicine contact a poison control center or emergency room at once. NOTE: This medicine is only for you. Do  not share this medicine with others. What if I miss a dose? Keep appointments for follow-up doses. It is important not to miss your dose. Call your care team if you are unable to keep an appointment. What may interact with this medication? Interactions have not been studied. This list may not describe all possible interactions. Give your health care provider a list of all the medicines, herbs, non-prescription drugs, or dietary supplements you use. Also tell them if you smoke, drink alcohol, or use illegal drugs. Some items may interact with your medicine. What should I watch for while using this medication? Your condition will be monitored carefully while you are receiving this medication. This medication may make you feel generally unwell. This is not uncommon, as chemotherapy can affect healthy cells as well as cancer cells. Report any side effects. Continue your course of treatment even though you feel ill unless your care team tells you to stop. In some cases, you may be given additional medications to help with side effects. Follow all directions for their use. This medication may increase your risk of getting an infection. Call your care team for advice if you get a fever, chills, sore throat, or other symptoms of a cold or flu. Do not treat yourself. Try to avoid being around people who are sick. This medication may increase your risk to bruise or bleed. Call your care team if you notice any unusual bleeding. Be careful brushing or flossing your teeth or using a toothpick because you may get an infection or bleed more easily. If you have any dental work done, tell your dentist you are receiving this medication. Avoid taking medications that contain aspirin, acetaminophen, ibuprofen, naproxen, or ketoprofen unless instructed by your care team. These medications may hide a fever. Talk to your care team if you or your partner wish to become pregnant or think you might be pregnant. This medication  can cause serious birth defects if taken during pregnancy and for 6 months after the last dose. A negative pregnancy test is required before starting this medication. A reliable form of contraception is recommended while taking this medication and for 6 months after the last dose. Talk to your care team about effective forms of contraception. Do not father a child while taking this medication and for 3 months after the last dose. Use a condom while having sex during this time period. Do not breastfeed while taking this medication and for at least 1 week after the last dose. This medication may cause infertility. Talk to your care team if you are concerned about your fertility. What side effects may I notice from receiving this medication? Side effects that you should report to your care team as soon as possible: Allergic reactions--skin rash, itching, hives, swelling of the face, lips, tongue, or throat Capillary leak syndrome--stomach or muscle pain, unusual  weakness or fatigue, feeling faint or lightheaded, decrease in the amount of urine, swelling of the ankles, hands, or feet, trouble breathing Infection--fever, chills, cough, sore throat, wounds that don't heal, pain or trouble when passing urine, general feeling of discomfort or being unwell Liver injury--right upper belly pain, loss of appetite, nausea, light-colored stool, dark yellow or brown urine, yellowing skin or eyes, unusual weakness or fatigue Low red blood cell level--unusual weakness or fatigue, dizziness, headache, trouble breathing Lung injury--shortness of breath or trouble breathing, cough, spitting up blood, chest pain, fever Stomach pain, bloody diarrhea, pale skin, unusual weakness or fatigue, decrease in the amount of urine, which may be signs of hemolytic uremic syndrome Sudden and severe headache, confusion, change in vision, seizures, which may be signs of posterior reversible encephalopathy syndrome (PRES) Unusual bruising  or bleeding Side effects that usually do not require medical attention (report to your care team if they continue or are bothersome): Diarrhea Drowsiness Hair loss Nausea Pain, redness, or swelling with sores inside the mouth or throat Vomiting This list may not describe all possible side effects. Call your doctor for medical advice about side effects. You may report side effects to FDA at 1-800-FDA-1088. Where should I keep my medication? This medication is given in a hospital or clinic. It will not be stored at home. NOTE: This sheet is a summary. It may not cover all possible information. If you have questions about this medicine, talk to your doctor, pharmacist, or health care provider.  2024 Elsevier/Gold Standard (2021-06-21 00:00:00)  BELOW ARE SYMPTOMS THAT SHOULD BE REPORTED IMMEDIATELY: *FEVER GREATER THAN 100.4 F (38 C) OR HIGHER *CHILLS OR SWEATING *NAUSEA AND VOMITING THAT IS NOT CONTROLLED WITH YOUR NAUSEA MEDICATION *UNUSUAL SHORTNESS OF BREATH *UNUSUAL BRUISING OR BLEEDING *URINARY PROBLEMS (pain or burning when urinating, or frequent urination) *BOWEL PROBLEMS (unusual diarrhea, constipation, pain near the anus) TENDERNESS IN MOUTH AND THROAT WITH OR WITHOUT PRESENCE OF ULCERS (sore throat, sores in mouth, or a toothache) UNUSUAL RASH, SWELLING OR PAIN  UNUSUAL VAGINAL DISCHARGE OR ITCHING   Items with * indicate a potential emergency and should be followed up as soon as possible or go to the Emergency Department if any problems should occur.  Please show the CHEMOTHERAPY ALERT CARD or IMMUNOTHERAPY ALERT CARD at check-in to the Emergency Department and triage nurse.  Should you have questions after your visit or need to cancel or reschedule your appointment, please contact [facility] [place] - A DEPT OF [facility]igh[facility] [phone]  and follow the prompts.  Office hours are 8:00 a.m. to 4:30 p.m. Monday - Friday. Please note that voicemails left  after 4:00 p.m. may not be returned until the following business day.  We are closed weekends and major holidays. You have access to a nurse at all times for urgent questions. Please call the main number to the clinic [phone] and follow the prompts.  For any non-urgent questions, you may also contact your provider using MyChart. We now offer e-Visits for anyone 82 and older to request care online for non-urgent symptoms. For details visit [email or web address].   Also download the MyChart app! Go to the app store, search "MyChart", open the app, select [place], and log in with your MyChart username and password.

## 2023-12-20 NOTE — Progress Notes (Signed)
 Patient Care Team: Renato Dorothey HERO, NP as PCP - General (Internal Medicine) Davonna Siad, MD as Medical Oncologist (Medical Oncology) Celestia Joesph SQUIBB, RN as Oncology Nurse Navigator (Medical Oncology)  Clinic Day:  12/20/2023  Referring physician: Renato Dorothey HERO, NP   CHIEF COMPLAINT:  CC: Pancreatic adenocarcinoma    ASSESSMENT & PLAN:   Assessment & Plan: [name]  is a 73 y.o. 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  Pathology consistent with pancreatic adenocarcinoma, moderately differentiated, all margins negative, direct tumor extension into splenic vein near the confluence with the portal vein.  So size of the tumor could not be currently assessed because of chronic pancreatitis on the sample. 1 lymph node positive (1/11) Genetic Screening: Negative CA 19-9 normal   - Started on chemotherapy with gemcitabine and Abraxane on 12/06/2023.  Cycle 1 day 15 today.  Tolerated previous cycle well with 1 day of nausea. - Labs reviewed today: CMP: Sodium: 132, creatinine: 0.48, calcium : 8.7, normal LFTs.  CBC: WBC: 10, hemoglobin: 10.9, platelets: 581. - We reviewed the recent CT scan and findings together.  Interval resection of spleen and pancreatic.  There is a soft tissue density in the operative bed that is suspicious for local recurrence.  Will discuss this at multidisciplinary tumor board. - Plan for 6 months of chemotherapy.  Physical exam stable today.  Proceed with chemotherapy today. - Will repeat CT scan in 3 months. - Will obtain CA 19-9 with every cycle.  Return to clinic prior to second cycle of chemotherapy for assessment of tolerance  Anemia secondary to chemotherapy and recent surgery Anemia likely secondary to surgery and chemotherapy. Hemoglobin improved from 7 to 10.9 with  iron  infusion and transfusion. No gastrointestinal bleeding signs.  - Monitor hemoglobin levels closely. - Monitor for signs of bleeding.  Edema of lower extremities Swelling in ankles likely due to fluid retention. Not on diuretics, elevates legs when lying down.  Constipation Constipation improved with stool softeners, not fully resolved.  - Continue Colace as needed.  Recurrent urinary tract infection Recurrent UTIs, improved with antibiotics but possible persistence.  - Repeat urinalysis to assess for current infection.  Poorly controlled COPD Asthma poorly controlled with wheezing. Inconsistent inhaler use and occasional smoking noted.  - Encouraged consistent use of inhalers and breathing treatments. - Advise cessation of smoking.  Current tobacco use Occasional tobacco use contributing to respiratory symptoms and poor asthma control.  - Advised cessation of smoking.  Gastrointestinal stromal tumor, malignant, colon Incidental diagnosis on omental nodule biopsy Low-grade and low risk with low mitotic rate. Caris test: Quality not sufficient   - Will observe at this time   Pneumococcal vaccination Patient underwent splenectomy and receiving vaccinations here.   Pneumococcal vaccine Prevnar 20 , Hib vaccine and  2 meningococcal vaccines first dose was given.   - Will administer PPSVV 23 every 5 years  - Meningococcal vaccine booster every 5 years - Yearly influenza vaccine   The patient understands the plans discussed today and is in agreement with them.  She knows to contact our office if she develops concerns prior to her next appointment.  The total time spent in the appointment was 40 minutes for the encounter  with patient, including review of chart and various tests results, discussions about plan of care and coordination of care plan    Siad Davonna, MD  [facility] [place] - A DEPT OF [facility] [address] [place] [address] Dept: [phone] Dept Fax: [phone]   Orders Placed This Encounter  Procedures   Urine culture   Urinalysis, Routine w reflex microscopic     ONCOLOGY HISTORY:   Diagnosis: Pancreatic adenocarcinoma:  -08/08/2023: CT CAP:2.1 cm hypoenhancing mass within the mid body of the pancreas resulting in mild dilation of the main pancreatic duct within the tail the pancreas and asymmetric pancreatic parenchymal atrophy within this region. No evidence regional adenopathy or distant metastatic disease within the abdomen and pelvis.  - 09/06/2023: EUS with biopsy:  -A round lesion was identified in the pancreatic body. The lesion was hypoechoic. The lesion measured 14 mm by 13 mm in maximal cross- sectional diameter. -Pancreatic body lesion, FNA: Adenocarcinoma - 09/17/2023: CA 19 9: 23-normal - 09/23/2023: CT chest:Scattered tiny pulmonary nodules measuring up to 2 mm, nonspecific but favored benign. No convincing evidence of metastatic disease in the chest.  -10/22/2023: Pancreatectomy and splenectomy.  -Pathology: Spleen and distal portion of pancreas showed moderately differentiated adenocarcinoma, at least 1.1 cm. Perineural invasion present. Metastatic adenocarcinoma in one of eleven lymph nodes (1/11). Pancreatic neck and duct margins were benign.  -Tumor size could not be determined, adenocarcinoma admixed with chronic pancreatitis - Involved by direct tumor extension to peripancreatic soft tissues-splenic vein near the confluence with the portal vein -Omentum nodule positive for calcified spindle cell lesion consistent with gastrointestinal stromal tumor, 2.2 cm. Tumor is diffusely positive for C-kit, while being negative for CK AE1/AE3.  - 11/26/2023: ATM, TMEM127-VUS, negative genetic testing. -11/29/2023: CT RJE:Pwuzmcjo splenectomy and resection of the pancreatic neck, body, and tail. Heterogeneous soft tissue density in the  operative bed is suspicious for local recurrence, especially given elevated CEA on 11/26/2023. Combination of postoperative scarring and sequelae of subacute pancreatitis felt less likely. No evidence of metastatic disease.    GIST:  -10/22/23: Omentum nodule biopsy: Omentum nodule positive for calcified spindle cell lesion consistent with gastrointestinal stromal tumor, 2.2 cm. Tumor is diffusely positive for C-kit, while being negative for CK AE1/AE3. Low mitotic rate.    Current Treatment: Plan for adjuvant Gemcitabine + Abraxane  INTERVAL HISTORY:   Discussed the use of AI scribe software for clinical note transcription with the patient, who gave verbal consent to proceed.  History of Present Illness CARIN SHIPP is a 73 year old female with pancreatic cancer who presents for chemotherapy follow-up. She is accompanied by her son today.  She is currently on day fifteen of her first cycle of chemotherapy with gemcitabine and Abraxane. She experienced nausea and vomiting only on the night of the treatment, which improved with nausea medication. No diarrhea or abdominal pain is noted, and her eyes feel good.  She experiences tenderness and pain when the port is accessed, but the application of a cream has helped reduce the discomfort.  She received iron  and a blood transfusion; her hemoglobin increased from 7 to 10.9. No bloody or black stools are reported.  She notes swelling in her ankles and is not currently taking any diuretics. She elevates her legs when lying down to manage the swelling.  Her constipation has improved with the use of a stool softener, although she describes it as 'not the best in the world, but better.'  She reports a persistent urinary tract infection despite previous antibiotic treatment, stating 'I keep it all the time.'  She experiences wheezing and uses  inhalers at home, although she occasionally smokes. She did not take a breathing treatment this morning  due to time constraints.    I have reviewed the past medical history, past surgical history, social history and family history with the patient and they are unchanged from previous note.  ALLERGIES:  is allergic to codeine and egg protein-containing drug products.  MEDICATIONS:  Current Outpatient Medications  Medication Sig Dispense Refill   acetaminophen  (TYLENOL ) 500 MG tablet Take 2 tablets (1,000 mg total) by mouth every 8 (eight) hours as needed for mild pain (pain score 1-3).     albuterol  (PROVENTIL  HFA;VENTOLIN  HFA) 108 (90 BASE) MCG/ACT inhaler Inhale 2 puffs into the lungs every 6 (six) hours as needed for wheezing or shortness of breath.     albuterol  (PROVENTIL ) (2.5 MG/3ML) 0.083% nebulizer solution USE 1 VIAL IN NEBULIZER EVERY 6 HOURS AS NEEDED; Duration: 13     ALPRAZolam  (XANAX ) 0.5 MG tablet Take 0.5 mg by mouth 2 (two) times daily.     BREZTRI  AEROSPHERE 160-9-4.8 MCG/ACT AERO Inhale 2 puffs into the lungs 2 (two) times daily.     cariprazine  (VRAYLAR ) 1.5 MG capsule Take 1.5 mg by mouth daily.     cyanocobalamin (VITAMIN B12) 1000 MCG tablet Take 1,000 mcg by mouth daily.     cyclobenzaprine (FLEXERIL) 10 MG tablet TAKE 1 TABLET BY MOUTH AT BEDTIME AS NEEDED FOR 30 DAYS; Duration: 30     diclofenac (VOLTAREN) 50 MG EC tablet Take 1 tablet by mouth twice daily; Duration: 90     escitalopram  (LEXAPRO ) 10 MG tablet Take 10 mg by mouth daily.     ferrous sulfate  325 (65 FE) MG tablet 1 tablet Orally Once a day; Duration: 90 days     furosemide  (LASIX ) 20 MG tablet Take 1 tablet (20 mg total) by mouth daily. 5 tablet 0   HYDROcodone -acetaminophen  (NORCO/VICODIN) 5-325 MG tablet 1 tablet as needed Orally every 8 hrs; Duration: 10 days     ipratropium-albuterol  (DUONEB) 0.5-2.5 (3) MG/3ML SOLN USE 1 AMPULE IN NEBULIZER 4 TIMES DAILY . APPOINTMENT REQUIRED FOR FUTURE REFILLS Inhalation every 6 hrs; Duration: 30 days     ketoconazole  (NIZORAL ) 2 % cream Apply to affected area  (external use only) twice a day, continue at least 2-3 days after rash resolves 30 g 0   lidocaine -prilocaine (EMLA) cream Apply to affected area once 30 g 3   lisinopril -hydrochlorothiazide  (ZESTORETIC ) 20-12.5 MG tablet Take 1 tablet by mouth daily.     Methylcobalamin (B12) 5000 MCG SUBL as directed Sublingual     omeprazole  (PRILOSEC) 40 MG capsule Take 1 capsule (40 mg total) by mouth 2 (two) times daily before a meal. 60 capsule 6   ondansetron  (ZOFRAN ) 8 MG tablet Take 1 tablet (8 mg total) by mouth every 8 (eight) hours as needed for nausea or vomiting. 30 tablet 1   oxyCODONE  (OXY IR/ROXICODONE ) 5 MG immediate release tablet Take 1 tablet (5 mg total) by mouth every 6 (six) hours as needed for severe pain (pain score 7-10). 15 tablet 0   PHILLIPS STOOL SOFTENER 100 MG capsule Take 1 capsule by mouth twice daily 10 capsule 0   polyethylene glycol (MIRALAX ) 17 g packet Take 17 g by mouth daily. 30 each 11   prochlorperazine  (COMPAZINE ) 10 MG tablet Take 1 tablet (10 mg total) by mouth every 6 (six) hours as needed for nausea or vomiting. 30 tablet 1   No current facility-administered medications for this visit.   Facility-Administered  Medications Ordered in Other Visits  Medication Dose Route Frequency Provider Last Rate Last Admin   0.9 %  sodium chloride  infusion (Manually program via Guardrails IV Fluids)  250 mL Intravenous Continuous Davonna Siad, MD   Stopped at 12/04/23 1145    REVIEW OF SYSTEMS:   Constitutional: Denies fevers, chills or abnormal weight loss Eyes: Denies blurriness of vision Ears, nose, mouth, throat, and face: Denies mucositis or sore throat Respiratory: Denies cough, dyspnea or wheezes Cardiovascular: Denies palpitation, chest discomfort , + lower extremity swelling Gastrointestinal:  Denies nausea, heartburn or change in bowel habits Skin: Denies abnormal skin rashes Lymphatics: Denies new lymphadenopathy or easy bruising Neurological:Denies  numbness, tingling or new weaknesses Behavioral/Psych: Mood is stable, no new changes  All other systems were reviewed with the patient and are negative.   VITALS:  Height 4' 9 (1.448 m), weight 136 lb (61.7 kg).  Wt Readings from Last 3 Encounters:  12/20/23 136 lb (61.7 kg)  12/06/23 138 lb 3.2 oz (62.7 kg)  11/30/23 133 lb (60.3 kg)    Body mass index is 29.43 kg/m.  Performance status (ECOG): 0 - Asymptomatic  PHYSICAL EXAM:   GENERAL:alert, no distress and comfortable SKIN: skin color, texture, turgor are normal, no rashes or significant lesions LUNGS: Diffuse bilateral wheezes present mainly in the upper lobes. HEART: regular rate & rhythm and no murmurs and no lower extremity edema ABDOMEN: No tenderness.  Normal bowel sounds.  No distention. Musculoskeletal:no cyanosis of digits and no clubbing  NEURO: alert & oriented x 3 with fluent speech, no focal motor/sensory deficits  LABORATORY DATA:  I have reviewed the data as listed    Component Value Date/Time   NA 132 (L) 12/20/2023 0813   NA 140 06/05/2023 1157   K 4.2 12/20/2023 0813   CL 95 (L) 12/20/2023 0813   CO2 28 12/20/2023 0813   GLUCOSE 132 (H) 12/20/2023 0813   BUN 8 12/20/2023 0813   BUN 7 (L) 06/05/2023 1157   CREATININE 0.48 12/20/2023 0813   CALCIUM  8.7 (L) 12/20/2023 0813   PROT 6.7 12/20/2023 0813   PROT 6.2 06/05/2023 1157   ALBUMIN  4.2 12/20/2023 0813   ALBUMIN  4.3 06/05/2023 1157   AST 15 12/20/2023 0813   ALT 14 12/20/2023 0813   ALKPHOS 84 12/20/2023 0813   BILITOT 0.2 12/20/2023 0813   BILITOT 0.2 06/05/2023 1157   GFRNONAA >60 12/20/2023 0813   GFRAA >60 09/01/2019 1359    Lab Results  Component Value Date   WBC 10.0 12/20/2023   NEUTROABS 4.8 12/20/2023   HGB 10.9 (L) 12/20/2023   HCT 35.8 (L) 12/20/2023   MCV 82.7 12/20/2023   PLT 581 (H) 12/20/2023      Chemistry      Component Value Date/Time   NA 132 (L) 12/20/2023 0813   NA 140 06/05/2023 1157   K 4.2 12/20/2023  0813   CL 95 (L) 12/20/2023 0813   CO2 28 12/20/2023 0813   BUN 8 12/20/2023 0813   BUN 7 (L) 06/05/2023 1157   CREATININE 0.48 12/20/2023 0813      Component Value Date/Time   CALCIUM  8.7 (L) 12/20/2023 0813   ALKPHOS 84 12/20/2023 0813   AST 15 12/20/2023 0813   ALT 14 12/20/2023 0813   BILITOT 0.2 12/20/2023 0813   BILITOT 0.2 06/05/2023 1157      Latest Reference Range & Units 11/26/23 08:42  CA 19-9 0 - 35 U/mL 34  CEA 0.0 - 4.7 ng/mL 5.4 (  H)  (H): Data is abnormally high  RADIOGRAPHIC STUDIES: I have personally reviewed the radiological images as listed and agreed with the findings in the report.  IR IMAGING GUIDED PORT INSERTION INDICATION: Port-A-Cath needed for treatment of pancreatic cancer.  EXAM: FLUOROSCOPIC AND ULTRASOUND GUIDED PLACEMENT OF A SUBCUTANEOUS PORT  MEDICATIONS: Moderate sedation  ANESTHESIA/SEDATION: Moderate (conscious) sedation was employed during this procedure. A total of Versed  2 mg and fentanyl  100 mcg was administered intravenously at the order of the provider performing the procedure.  Total intra-service moderate sedation time: 30 minutes.  Patient's level of consciousness and vital signs were monitored continuously by radiology nurse throughout the procedure under the supervision of the provider performing the procedure.  FLUOROSCOPY TIME:  Radiation Exposure Index (as provided by the fluoroscopic device): 1 mGy Kerma  COMPLICATIONS: None immediate.  PROCEDURE: The procedure, risks, benefits, and alternatives were explained to the patient. Questions regarding the procedure were encouraged and answered. The patient understands and consents to the procedure.  Patient was placed supine on the interventional table. Ultrasound confirmed a patent right internal jugular vein. Ultrasound image was saved for documentation. The right chest and neck were cleaned with a skin antiseptic and a sterile drape was placed. Maximal  barrier sterile technique was utilized including caps, mask, sterile gowns, sterile gloves, sterile drape, hand hygiene and skin antiseptic. The right neck was anesthetized with 1% lidocaine . Small incision was made in the right neck with a blade. Micropuncture set was placed in the right internal jugular vein with ultrasound guidance. The micropuncture wire was used for measurement purposes. The right chest was anesthetized with 1% lidocaine  with epinephrine. #15 blade was used to make an incision and a subcutaneous port pocket was formed. 8 french Power Port was assembled. Subcutaneous tunnel was formed with a stiff tunneling device. The port catheter was brought through the subcutaneous tunnel. The port was placed in the subcutaneous pocket. The micropuncture set was exchanged for a peel-away sheath. The catheter was placed through the peel-away sheath and the tip was positioned at the superior cavoatrial junction. Catheter placement was confirmed with fluoroscopy. The port was accessed and flushed with heparinized saline. The port pocket was closed using two layers of absorbable sutures and Dermabond. The vein skin site was closed using a single layer of absorbable suture and Dermabond. Sterile dressings were applied. Patient tolerated the procedure well without an immediate complication. Ultrasound and fluoroscopic images were taken and saved for this procedure.  IMPRESSION: Placement of a subcutaneous power-injectable port device. Catheter tip at the superior cavoatrial junction.  Electronically Signed   By: Juliene Balder M.D.   On: 11/30/2023 10:24

## 2023-12-20 NOTE — Progress Notes (Signed)
 Patient presents today for chemotherapy Abraxane/Gemzar  infusion. Patient is in satisfactory condition with no new complaints voiced.  Vital signs are stable.  Labs reviewed by Dr. Davonna during the office visit and all labs are within treatment parameters.  We will proceed with treatment per MD orders.   Treatment given today per MD orders. Tolerated infusion without adverse affects. Vital signs stable. No complaints at this time. Discharged from clinic ambulatory in stable condition. Alert and oriented x 3. F/U with [facility] as scheduled.

## 2023-12-21 ENCOUNTER — Other Ambulatory Visit: Payer: Self-pay | Admitting: Oncology

## 2023-12-22 LAB — URINE CULTURE: Culture: >=100000 — AB

## 2023-12-24 ENCOUNTER — Other Ambulatory Visit: Payer: Self-pay | Admitting: *Deleted

## 2023-12-24 ENCOUNTER — Ambulatory Visit: Payer: Self-pay | Admitting: *Deleted

## 2023-12-24 DIAGNOSIS — N390 Urinary tract infection, site not specified: Secondary | ICD-10-CM

## 2023-12-24 MED ORDER — SULFAMETHOXAZOLE-TRIMETHOPRIM 800-160 MG PO TABS: 1.0000 | ORAL_TABLET | Freq: Two times a day (BID) | ORAL | 0 refills | Status: AC

## 2023-12-24 NOTE — Progress Notes (Signed)
 Patient describes having burning and urinary urgency.  Per Dr. Davonna, sent Bactrim DS BID x 3 days.  Patient aware and verbalized understanding.

## 2023-12-31 ENCOUNTER — Telehealth: Payer: Self-pay | Admitting: Dietician

## 2023-12-31 ENCOUNTER — Inpatient Hospital Stay: Attending: Oncology | Admitting: Dietician

## 2023-12-31 DIAGNOSIS — K5900 Constipation, unspecified: Secondary | ICD-10-CM | POA: Insufficient documentation

## 2023-12-31 DIAGNOSIS — C250 Malignant neoplasm of head of pancreas: Secondary | ICD-10-CM | POA: Insufficient documentation

## 2023-12-31 DIAGNOSIS — D6481 Anemia due to antineoplastic chemotherapy: Secondary | ICD-10-CM | POA: Insufficient documentation

## 2023-12-31 DIAGNOSIS — T451X5A Adverse effect of antineoplastic and immunosuppressive drugs, initial encounter: Secondary | ICD-10-CM | POA: Insufficient documentation

## 2023-12-31 DIAGNOSIS — Z5111 Encounter for antineoplastic chemotherapy: Secondary | ICD-10-CM | POA: Insufficient documentation

## 2023-12-31 DIAGNOSIS — K861 Other chronic pancreatitis: Secondary | ICD-10-CM | POA: Insufficient documentation

## 2023-12-31 DIAGNOSIS — Z79899 Other long term (current) drug therapy: Secondary | ICD-10-CM | POA: Insufficient documentation

## 2023-12-31 DIAGNOSIS — F17210 Nicotine dependence, cigarettes, uncomplicated: Secondary | ICD-10-CM | POA: Insufficient documentation

## 2023-12-31 DIAGNOSIS — J4489 Other specified chronic obstructive pulmonary disease: Secondary | ICD-10-CM | POA: Insufficient documentation

## 2023-12-31 DIAGNOSIS — Z87440 Personal history of urinary (tract) infections: Secondary | ICD-10-CM | POA: Insufficient documentation

## 2023-12-31 DIAGNOSIS — R600 Localized edema: Secondary | ICD-10-CM | POA: Insufficient documentation

## 2023-12-31 DIAGNOSIS — Z791 Long term (current) use of non-steroidal anti-inflammatories (NSAID): Secondary | ICD-10-CM | POA: Insufficient documentation

## 2023-12-31 NOTE — Telephone Encounter (Signed)
 Nutrition Follow-up:  with stage II pancreatic adenocarcinoma. S/p partial pancreatectomy and splenectomy under the care of Dr. Dasie (8/25). Receiving adjuvant gemcitabine/abraxane (start 10/9). Patient is under the care of Dr. Davonna Sermon with patient via telephone. She is doing okay today. Tolerated second treatment without nausea, vomiting. Hoping this continues. Patient has lost her hair which has been emotionally difficult.   Her appetite is good. Says sometimes she eats too much and her stomach hurts. Patient has had a bowl of grits and chicken pot pie so far today. She reports avoiding pork due to increased blood pressure after consuming. She is drinking 4-5 bottles of water.   Patient reports dysuria that started Wednesday last week. Patient recently completed antibiotics for UTI. She denies pain. Urine is clear. Patient able to empty bladder once urination begins.    Medications: reviewed   Labs: 10/23 - Na 132, glucose 132  Anthropometrics: Wt 136 lb on 10/23  10/9 - 138 lb 3.2 oz  9/29 - 133 lb 6.1 oz   NUTRITION DIAGNOSIS: Food and nutrition related knowledge deficit improving    INTERVENTION:  Encourage small frequent meals/snacks vs attempting larger meals Pt to discuss dysuria at next [facility] visit 11/6 Informed pt on obtaining one wig at no cost via ACS - she will think about this  Support and encouragement     MONITORING, EVALUATION, GOAL: weight trends, intake   NEXT VISIT: Monday December 8 via telephone

## 2024-01-02 ENCOUNTER — Other Ambulatory Visit: Payer: Self-pay

## 2024-01-02 NOTE — Progress Notes (Signed)
 The proposed treatment discussed in conference is for discussion purpose only and is not a binding recommendation.  The patients have not been physically examined, or presented with their treatment options.  Therefore, final treatment plans cannot be decided.

## 2024-01-03 ENCOUNTER — Inpatient Hospital Stay

## 2024-01-03 VITALS — BP 145/66 | HR 68 | Temp 97.5°F | Resp 18

## 2024-01-03 DIAGNOSIS — C259 Malignant neoplasm of pancreas, unspecified: Secondary | ICD-10-CM

## 2024-01-03 DIAGNOSIS — Z791 Long term (current) use of non-steroidal anti-inflammatories (NSAID): Secondary | ICD-10-CM | POA: Diagnosis not present

## 2024-01-03 DIAGNOSIS — T451X5A Adverse effect of antineoplastic and immunosuppressive drugs, initial encounter: Secondary | ICD-10-CM | POA: Diagnosis not present

## 2024-01-03 DIAGNOSIS — D6481 Anemia due to antineoplastic chemotherapy: Secondary | ICD-10-CM | POA: Diagnosis not present

## 2024-01-03 DIAGNOSIS — K5900 Constipation, unspecified: Secondary | ICD-10-CM | POA: Diagnosis not present

## 2024-01-03 DIAGNOSIS — Z79899 Other long term (current) drug therapy: Secondary | ICD-10-CM | POA: Diagnosis not present

## 2024-01-03 DIAGNOSIS — Z87440 Personal history of urinary (tract) infections: Secondary | ICD-10-CM | POA: Diagnosis not present

## 2024-01-03 DIAGNOSIS — J4489 Other specified chronic obstructive pulmonary disease: Secondary | ICD-10-CM | POA: Diagnosis not present

## 2024-01-03 DIAGNOSIS — F17210 Nicotine dependence, cigarettes, uncomplicated: Secondary | ICD-10-CM | POA: Diagnosis not present

## 2024-01-03 DIAGNOSIS — R600 Localized edema: Secondary | ICD-10-CM | POA: Diagnosis not present

## 2024-01-03 DIAGNOSIS — Z5111 Encounter for antineoplastic chemotherapy: Secondary | ICD-10-CM | POA: Diagnosis present

## 2024-01-03 DIAGNOSIS — C250 Malignant neoplasm of head of pancreas: Secondary | ICD-10-CM | POA: Diagnosis not present

## 2024-01-03 DIAGNOSIS — K861 Other chronic pancreatitis: Secondary | ICD-10-CM | POA: Diagnosis not present

## 2024-01-03 LAB — CBC WITH DIFFERENTIAL/PLATELET
Abs Immature Granulocytes: 0.01 K/uL (ref 0.00–0.07)
Basophils Absolute: 0.1 K/uL (ref 0.0–0.1)
Basophils Relative: 1 %
Eosinophils Absolute: 0.2 K/uL (ref 0.0–0.5)
Eosinophils Relative: 2 %
HCT: 36.2 % (ref 36.0–46.0)
Hemoglobin: 11.5 g/dL — ABNORMAL LOW (ref 12.0–15.0)
Immature Granulocytes: 0 %
Lymphocytes Relative: 36 %
Lymphs Abs: 2.7 K/uL (ref 0.7–4.0)
MCH: 26.2 pg (ref 26.0–34.0)
MCHC: 31.8 g/dL (ref 30.0–36.0)
MCV: 82.5 fL (ref 80.0–100.0)
Monocytes Absolute: 1 K/uL (ref 0.1–1.0)
Monocytes Relative: 13 %
Neutro Abs: 3.6 K/uL (ref 1.7–7.7)
Neutrophils Relative %: 48 %
Platelets: 411 K/uL — ABNORMAL HIGH (ref 150–400)
RBC: 4.39 MIL/uL (ref 3.87–5.11)
RDW: 28.5 % — ABNORMAL HIGH (ref 11.5–15.5)
Smear Review: NORMAL
WBC: 7.6 K/uL (ref 4.0–10.5)
nRBC: 0 % (ref 0.0–0.2)

## 2024-01-03 LAB — COMPREHENSIVE METABOLIC PANEL WITH GFR
ALT: 24 U/L (ref 0–44)
AST: 20 U/L (ref 15–41)
Albumin: 4.4 g/dL (ref 3.5–5.0)
Alkaline Phosphatase: 79 U/L (ref 38–126)
Anion gap: 8 (ref 5–15)
BUN: 10 mg/dL (ref 8–23)
CO2: 29 mmol/L (ref 22–32)
Calcium: 9.2 mg/dL (ref 8.9–10.3)
Chloride: 93 mmol/L — ABNORMAL LOW (ref 98–111)
Creatinine, Ser: 0.48 mg/dL (ref 0.44–1.00)
GFR, Estimated: >60 mL/min (ref >60)
Glucose, Bld: 118 mg/dL — ABNORMAL HIGH (ref 70–99)
Potassium: 4.2 mmol/L (ref 3.5–5.1)
Sodium: 130 mmol/L — ABNORMAL LOW (ref 135–145)
Total Bilirubin: 0.2 mg/dL (ref 0.0–1.2)
Total Protein: 6.9 g/dL (ref 6.5–8.1)

## 2024-01-03 LAB — MAGNESIUM: Magnesium: 2.1 mg/dL (ref 1.7–2.4)

## 2024-01-03 MED ORDER — PROCHLORPERAZINE MALEATE 10 MG PO TABS
10.0000 mg | ORAL_TABLET | Freq: Once | ORAL | Status: AC
  Administered 2024-01-03: 10 mg via ORAL
  Filled 2024-01-03: qty 1

## 2024-01-03 MED ORDER — SODIUM CHLORIDE 0.9 % IV SOLN: INTRAVENOUS | Status: DC

## 2024-01-03 MED ORDER — PACLITAXEL PROTEIN-BOUND CHEMO INJECTION 100 MG
125.0000 mg/m2 | Freq: Once | INTRAVENOUS | Status: AC
  Administered 2024-01-03: 200 mg via INTRAVENOUS
  Filled 2024-01-03: qty 40

## 2024-01-03 MED ORDER — SODIUM CHLORIDE 0.9 % IV SOLN
1000.0000 mg/m2 | Freq: Once | INTRAVENOUS | Status: AC
  Administered 2024-01-03: 1596 mg via INTRAVENOUS
  Filled 2024-01-03: qty 41.98

## 2024-01-03 NOTE — Patient Instructions (Signed)
 [facility] [place] - A DEPT OF . [facility]  Discharge Instructions: Thank you for choosing [facility] to provide your oncology and hematology care.  If you have a lab appointment with the Cancer Center - please note that after April 8th, 2024, all labs will be drawn in the cancer center.  You do not have to check in or register with the main entrance as you have in the past but will complete your check-in in the cancer center.  Wear comfortable clothing and clothing appropriate for easy access to any Portacath or PICC line.   We strive to give you quality time with your provider. You may need to reschedule your appointment if you arrive late (15 or more minutes).  Arriving late affects you and other patients whose appointments are after yours.  Also, if you miss three or more appointments without notifying the office, you may be dismissed from the clinic at the provider's discretion.      For prescription refill requests, have your pharmacy contact our office and allow 72 hours for refills to be completed.    Today you received the following chemotherapy and/or immunotherapy agents abraxane  and gemzar .       To help prevent nausea and vomiting after your treatment, we encourage you to take your nausea medication as directed.  BELOW ARE SYMPTOMS THAT SHOULD BE REPORTED IMMEDIATELY: *FEVER GREATER THAN 100.4 F (38 C) OR HIGHER *CHILLS OR SWEATING *NAUSEA AND VOMITING THAT IS NOT CONTROLLED WITH YOUR NAUSEA MEDICATION *UNUSUAL SHORTNESS OF BREATH *UNUSUAL BRUISING OR BLEEDING *URINARY PROBLEMS (pain or burning when urinating, or frequent urination) *BOWEL PROBLEMS (unusual diarrhea, constipation, pain near the anus) TENDERNESS IN MOUTH AND THROAT WITH OR WITHOUT PRESENCE OF ULCERS (sore throat, sores in mouth, or a toothache) UNUSUAL RASH, SWELLING OR PAIN  UNUSUAL VAGINAL DISCHARGE OR ITCHING   Items with * indicate a potential emergency and should be  followed up as soon as possible or go to the Emergency Department if any problems should occur.  Please show the CHEMOTHERAPY ALERT CARD or IMMUNOTHERAPY ALERT CARD at check-in to the Emergency Department and triage nurse.  Should you have questions after your visit or need to cancel or reschedule your appointment, please contact [facility] [place] - A DEPT OF [facility] [phone]  and follow the prompts.  Office hours are 8:00 a.m. to 4:30 p.m. Monday - Friday. Please note that voicemails left after 4:00 p.m. may not be returned until the following business day.  We are closed weekends and major holidays. You have access to a nurse at all times for urgent questions. Please call the main number to the clinic [phone] and follow the prompts.  For any non-urgent questions, you may also contact your provider using MyChart. We now offer e-Visits for anyone 29 and older to request care online for non-urgent symptoms. For details visit [email or web address].   Also download the MyChart app! Go to the app store, search "MyChart", open the app, select [facility], and log in with your MyChart username and password.

## 2024-01-03 NOTE — Progress Notes (Signed)

## 2024-01-04 LAB — CANCER ANTIGEN 19-9: CA 19-9: 36 U/mL — ABNORMAL HIGH (ref 0–35)

## 2024-01-17 ENCOUNTER — Inpatient Hospital Stay: Admitting: Oncology

## 2024-01-17 ENCOUNTER — Inpatient Hospital Stay

## 2024-01-17 VITALS — BP 165/66 | HR 66 | Temp 97.0°F | Resp 18

## 2024-01-17 DIAGNOSIS — Z5111 Encounter for antineoplastic chemotherapy: Secondary | ICD-10-CM | POA: Diagnosis not present

## 2024-01-17 DIAGNOSIS — J449 Chronic obstructive pulmonary disease, unspecified: Secondary | ICD-10-CM

## 2024-01-17 DIAGNOSIS — D649 Anemia, unspecified: Secondary | ICD-10-CM

## 2024-01-17 DIAGNOSIS — C49A4 Gastrointestinal stromal tumor of large intestine: Secondary | ICD-10-CM | POA: Diagnosis not present

## 2024-01-17 DIAGNOSIS — C259 Malignant neoplasm of pancreas, unspecified: Secondary | ICD-10-CM

## 2024-01-17 DIAGNOSIS — Z720 Tobacco use: Secondary | ICD-10-CM | POA: Diagnosis not present

## 2024-01-17 LAB — CBC WITH DIFFERENTIAL/PLATELET
Abs Immature Granulocytes: 0.02 K/uL (ref 0.00–0.07)
Basophils Absolute: 0.1 K/uL (ref 0.0–0.1)
Basophils Relative: 2 %
Eosinophils Absolute: 0.2 K/uL (ref 0.0–0.5)
Eosinophils Relative: 2 %
HCT: 37.8 % (ref 36.0–46.0)
Hemoglobin: 12 g/dL (ref 12.0–15.0)
Immature Granulocytes: 0 %
Lymphocytes Relative: 26 %
Lymphs Abs: 2.4 K/uL (ref 0.7–4.0)
MCH: 27 pg (ref 26.0–34.0)
MCHC: 31.7 g/dL (ref 30.0–36.0)
MCV: 84.9 fL (ref 80.0–100.0)
Monocytes Absolute: 1.3 K/uL — ABNORMAL HIGH (ref 0.1–1.0)
Monocytes Relative: 15 %
Neutro Abs: 4.9 K/uL (ref 1.7–7.7)
Neutrophils Relative %: 55 %
Platelets: 353 K/uL (ref 150–400)
RBC: 4.45 MIL/uL (ref 3.87–5.11)
RDW: 28.9 % — ABNORMAL HIGH (ref 11.5–15.5)
Smear Review: NORMAL
WBC: 8.9 K/uL (ref 4.0–10.5)
nRBC: 0 % (ref 0.0–0.2)

## 2024-01-17 LAB — COMPREHENSIVE METABOLIC PANEL WITH GFR
ALT: 19 U/L (ref 0–44)
AST: 18 U/L (ref 15–41)
Albumin: 4.4 g/dL (ref 3.5–5.0)
Alkaline Phosphatase: 76 U/L (ref 38–126)
Anion gap: 10 (ref 5–15)
BUN: 14 mg/dL (ref 8–23)
CO2: 28 mmol/L (ref 22–32)
Calcium: 9 mg/dL (ref 8.9–10.3)
Chloride: 95 mmol/L — ABNORMAL LOW (ref 98–111)
Creatinine, Ser: 0.49 mg/dL (ref 0.44–1.00)
GFR, Estimated: >60 mL/min (ref >60)
Glucose, Bld: 147 mg/dL — ABNORMAL HIGH (ref 70–99)
Potassium: 4 mmol/L (ref 3.5–5.1)
Sodium: 132 mmol/L — ABNORMAL LOW (ref 135–145)
Total Bilirubin: 0.2 mg/dL (ref 0.0–1.2)
Total Protein: 6.7 g/dL (ref 6.5–8.1)

## 2024-01-17 LAB — MAGNESIUM: Magnesium: 2.1 mg/dL (ref 1.7–2.4)

## 2024-01-17 MED ORDER — SODIUM CHLORIDE 0.9 % IV SOLN: INTRAVENOUS | Status: DC

## 2024-01-17 MED ORDER — PACLITAXEL PROTEIN-BOUND CHEMO INJECTION 100 MG
125.0000 mg/m2 | Freq: Once | INTRAVENOUS | Status: AC
  Administered 2024-01-17: 200 mg via INTRAVENOUS
  Filled 2024-01-17: qty 40

## 2024-01-17 MED ORDER — SODIUM CHLORIDE 0.9 % IV SOLN
1000.0000 mg/m2 | Freq: Once | INTRAVENOUS | Status: AC
  Administered 2024-01-17: 1596 mg via INTRAVENOUS
  Filled 2024-01-17: qty 41.98

## 2024-01-17 MED ORDER — PROCHLORPERAZINE MALEATE 10 MG PO TABS
10.0000 mg | ORAL_TABLET | Freq: Once | ORAL | Status: AC
  Administered 2024-01-17: 10 mg via ORAL
  Filled 2024-01-17: qty 1

## 2024-01-17 NOTE — Progress Notes (Signed)
 Patient has been examined by Dr. Davonna. Vital signs and labs have been reviewed by MD - ANC, Creatinine, LFTs, hemoglobin, and platelets have been reviewed by M.D. - pt may proceed with treatment.  Primary RN and pharmacy notified.

## 2024-01-17 NOTE — Progress Notes (Signed)
 Patient Care Team: Renato Dorothey HERO, NP as PCP - General (Internal Medicine) Davonna Siad, MD as Medical Oncologist (Medical Oncology) Celestia Joesph SQUIBB, RN as Oncology Nurse Navigator (Medical Oncology)  Clinic Day:  01/17/2024  Referring physician: Renato Dorothey HERO, NP   CHIEF COMPLAINT:  CC: Pancreatic adenocarcinoma    ASSESSMENT & PLAN:   Assessment & Plan: [name]  is a 73 y.o. 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  Pathology consistent with pancreatic adenocarcinoma, moderately differentiated, all margins negative, direct tumor extension into splenic vein near the confluence with the portal vein.  So size of the tumor could not be currently assessed because of chronic pancreatitis on the sample. 1 lymph node positive (1/11) Genetic Screening: Negative CA 19-9 normal   - Started on chemotherapy with gemcitabine and Abraxane on 12/06/2023.  Cycle 2 day 15 today.  Tolerated previous cycle well with 1 day of nausea. - Labs reviewed today: CMP: Sodium: 132, creatinine: 0.49, normal LFTs.  CBC: WBC: normal, hemoglobin: 12.0, platelets: 353. - Recent CT showed a soft tissue density in the operative bed that is suspicious for local recurrence.Discussed at [facility] and thought to be post surgical changes. Will repeat after completing 3 cycles - Plan for 6 months of chemotherapy.  Physical exam stable today.  Proceed with chemotherapy today. - Will obtain CA 19-9 with every cycle. Slightly elevated at 36.  Return to clinic prior to third cycle of chemotherapy for assessment of tolerance with APP and see me with scan prior to 4th cycle.  Anemia secondary to chemotherapy and recent surgery Anemia likely secondary to surgery and chemotherapy. Hemoglobin improved from 7 to 12.0 with iron  infusion and transfusion. No  gastrointestinal bleeding signs.  Resolved at this time  Edema of lower extremities Swelling in ankles likely due to fluid retention right > left.No pain or tenderness. Not on diuretics, elevates legs when lying down.  Constipation Constipation improved with stool softeners, not fully resolved.  - Continue Colace as needed.  Recurrent urinary tract infection Recurrent UTIs, resolved with antibiotics.  Poorly controlled COPD Asthma poorly controlled with wheezing. Inconsistent inhaler use and occasional smoking noted.  - Encouraged consistent use of inhalers and breathing treatments. - Advise cessation of smoking.  Current tobacco use Occasional tobacco use contributing to respiratory symptoms and poor asthma control.  - Advised cessation of smoking.  Gastrointestinal stromal tumor, malignant, colon Incidental diagnosis on omental nodule biopsy Low-grade and low risk with low mitotic rate. Caris test: Quality not sufficient   - Will observe at this time   Pneumococcal vaccination Patient underwent splenectomy and receiving vaccinations here.   Pneumococcal vaccine Prevnar 20 , Hib vaccine and  2 meningococcal vaccines first dose was given.   - Will administer PPSVV 23 every 5 years  - Meningococcal vaccine booster every 5 years - Yearly influenza vaccine   The patient understands the plans discussed today and is in agreement with them.  She knows to contact our office if she develops concerns prior to her next appointment.  The total time spent in the appointment was 21 minutes for the encounter  with patient, including review of chart and various tests results, discussions about plan of care and coordination of care plan   I,Helena R Teague,acting as a scribe for Siad Davonna, MD.,have documented all relevant documentation on the behalf of Siad Davonna, MD,as directed by  Siad Davonna, MD  while in the presence of Mickiel Dry, MD.  I, Mickiel Dry MD,  have reviewed the above documentation for accuracy and completeness, and I agree with the above.    Mickiel Dry, MD  [facility] [place] - A DEPT OF [facility] [address] [place] [address] Dept: [phone] Dept Fax: [phone]   Orders Placed This Encounter  Procedures   CT CHEST ABDOMEN PELVIS W CONTRAST    Standing Status:   Future    Expected Date:   02/16/2024    Expiration Date:   01/16/2025    If indicated for the ordered procedure, I authorize the administration of contrast media per Radiology protocol:   Yes    Does the patient have a contrast media/X-ray dye allergy?:   No    Preferred imaging location?:   [facility]    If indicated for the ordered procedure, I authorize the administration of oral contrast media per Radiology protocol:   Yes     ONCOLOGY HISTORY:   Diagnosis: Pancreatic adenocarcinoma:  -08/08/2023: CT CAP:2.1 cm hypoenhancing mass within the mid body of the pancreas resulting in mild dilation of the main pancreatic duct within the tail the pancreas and asymmetric pancreatic parenchymal atrophy within this region. No evidence regional adenopathy or distant metastatic disease within the abdomen and pelvis.  - 09/06/2023: EUS with biopsy:  -A round lesion was identified in the pancreatic body. The lesion was hypoechoic. The lesion measured 14 mm by 13 mm in maximal cross- sectional diameter. -Pancreatic body lesion, FNA: Adenocarcinoma - 09/17/2023: CA 19 9: 23-normal - 09/23/2023: CT chest:Scattered tiny pulmonary nodules measuring up to 2 mm, nonspecific but favored benign. No convincing evidence of metastatic disease in the chest.  -10/22/2023: Pancreatectomy and splenectomy.  -Pathology: Spleen and distal portion of pancreas showed moderately differentiated adenocarcinoma, at least 1.1 cm. Perineural invasion present. Metastatic adenocarcinoma in one of eleven lymph nodes  (1/11). Pancreatic neck and duct margins were benign.  -Tumor size could not be determined, adenocarcinoma admixed with chronic pancreatitis - Involved by direct tumor extension to peripancreatic soft tissues-splenic vein near the confluence with the portal vein -Omentum nodule positive for calcified spindle cell lesion consistent with gastrointestinal stromal tumor, 2.2 cm. Tumor is diffusely positive for C-kit, while being negative for CK AE1/AE3.  - 11/26/2023: ATM, TMEM127-VUS, negative genetic testing. -11/29/2023: CT CAP: Interval splenectomy and resection of the pancreatic neck, body, and tail. Heterogeneous soft tissue density in the operative bed is suspicious for local recurrence, especially given elevated CEA on 11/26/2023. Combination of postoperative scarring and sequelae of subacute pancreatitis felt less likely. No evidence of metastatic disease.  - Discussed CT scan at [facility]. Thought to be secondary to post surgical changes.    GIST:  -10/22/23: Omentum nodule biopsy: Omentum nodule positive for calcified spindle cell lesion consistent with gastrointestinal stromal tumor, 2.2 cm. Tumor is diffusely positive for C-kit, while being negative for CK AE1/AE3. Low mitotic rate.    Current Treatment: Plan for adjuvant Gemcitabine + Abraxane  INTERVAL HISTORY:   HANEEFAH VENTURINI is a 73 y.o. female presenting to the clinic today for follow-up of pancreatic adenocarcinoma and GIST. Patient is accompanied by her son.  Jamina experiences hair loss. She is tolerating chemotherapy well and denies any nausea or vomiting. She notes alternating diarrhea and constipation. Her son, who is her transportation to the clinic, will be going on vacation when her third cycle was originally  scheduled for and we will postpone this cycles for 2 weeks.   Shannell reports right lower extremity edema around the ankle and denies any pain to the area. She also notes pain in her left knee where she had an arthroscopy.    She denies any dysuria after being treated with antibiotics for a UTI.   Darlynn continues to smoke, though she states she is attempting to stop.   I have reviewed the past medical history, past surgical history, social history and family history with the patient and they are unchanged from previous note.  ALLERGIES:  is allergic to codeine and egg protein-containing drug products.  MEDICATIONS:  Current Outpatient Medications  Medication Sig Dispense Refill   acetaminophen  (TYLENOL ) 500 MG tablet Take 2 tablets (1,000 mg total) by mouth every 8 (eight) hours as needed for mild pain (pain score 1-3).     albuterol  (PROVENTIL  HFA;VENTOLIN  HFA) 108 (90 BASE) MCG/ACT inhaler Inhale 2 puffs into the lungs every 6 (six) hours as needed for wheezing or shortness of breath.     albuterol  (PROVENTIL ) (2.5 MG/3ML) 0.083% nebulizer solution USE 1 VIAL IN NEBULIZER EVERY 6 HOURS AS NEEDED; Duration: 13     ALPRAZolam  (XANAX ) 0.5 MG tablet Take 0.5 mg by mouth 2 (two) times daily.     BREZTRI  AEROSPHERE 160-9-4.8 MCG/ACT AERO Inhale 2 puffs into the lungs 2 (two) times daily.     cariprazine  (VRAYLAR ) 1.5 MG capsule Take 1.5 mg by mouth daily.     cyanocobalamin (VITAMIN B12) 1000 MCG tablet Take 1,000 mcg by mouth daily.     cyclobenzaprine (FLEXERIL) 10 MG tablet TAKE 1 TABLET BY MOUTH AT BEDTIME AS NEEDED FOR 30 DAYS; Duration: 30     diclofenac (VOLTAREN) 50 MG EC tablet Take 1 tablet by mouth twice daily; Duration: 90     escitalopram  (LEXAPRO ) 10 MG tablet Take 10 mg by mouth daily.     ferrous sulfate  325 (65 FE) MG tablet 1 tablet Orally Once a day; Duration: 90 days     furosemide  (LASIX ) 20 MG tablet Take 1 tablet (20 mg total) by mouth daily. 5 tablet 0   HYDROcodone -acetaminophen  (NORCO/VICODIN) 5-325 MG tablet 1 tablet as needed Orally every 8 hrs; Duration: 10 days     ipratropium-albuterol  (DUONEB) 0.5-2.5 (3) MG/3ML SOLN USE 1 AMPULE IN NEBULIZER 4 TIMES DAILY . APPOINTMENT REQUIRED FOR  FUTURE REFILLS Inhalation every 6 hrs; Duration: 30 days     lidocaine -prilocaine (EMLA) cream Apply to affected area once 30 g 3   lisinopril -hydrochlorothiazide  (ZESTORETIC ) 20-12.5 MG tablet Take 1 tablet by mouth daily.     Methylcobalamin (B12) 5000 MCG SUBL as directed Sublingual     omeprazole  (PRILOSEC) 40 MG capsule Take 1 capsule (40 mg total) by mouth 2 (two) times daily before a meal. 60 capsule 6   ondansetron  (ZOFRAN ) 8 MG tablet Take 1 tablet (8 mg total) by mouth every 8 (eight) hours as needed for nausea or vomiting. 30 tablet 1   oxyCODONE  (OXY IR/ROXICODONE ) 5 MG immediate release tablet Take 1 tablet (5 mg total) by mouth every 6 (six) hours as needed for severe pain (pain score 7-10). 15 tablet 0   PHILLIPS STOOL SOFTENER 100 MG capsule Take 1 capsule by mouth twice daily 10 capsule 0   polyethylene glycol (MIRALAX ) 17 g packet Take 17 g by mouth daily. 30 each 11   prochlorperazine  (COMPAZINE ) 10 MG tablet Take 1 tablet (10 mg total) by mouth every 6 (six) hours  as needed for nausea or vomiting. 30 tablet 1   No current facility-administered medications for this visit.   Facility-Administered Medications Ordered in Other Visits  Medication Dose Route Frequency Provider Last Rate Last Admin   0.9 %  sodium chloride  infusion (Manually program via Guardrails IV Fluids)  250 mL Intravenous Continuous Davonna Siad, MD   Stopped at 12/04/23 1145   0.9 %  sodium chloride  infusion   Intravenous Continuous Davonna Siad, MD 10 mL/hr at 01/17/24 1145 New Bag at 01/17/24 1145   gemcitabine (GEMZAR) 1,596 mg in sodium chloride  0.9 % 250 mL chemo infusion  1,000 mg/m2 (Treatment Plan Recorded) Intravenous Once Falynn Ailey, MD 584 mL/hr at 01/17/24 1334 1,596 mg at 01/17/24 1334    REVIEW OF SYSTEMS:   Constitutional: Denies fevers, chills or abnormal weight loss Eyes: Denies blurriness of vision Ears, nose, mouth, throat, and face: Denies mucositis or sore  throat Respiratory: Denies cough, dyspnea or wheezes Cardiovascular: Denies palpitation, chest discomfort , + lower extremity swelling Gastrointestinal:  Denies nausea, heartburn or change in bowel habits Skin: Denies abnormal skin rashes Lymphatics: Denies new lymphadenopathy or easy bruising Neurological:Denies numbness, tingling or new weaknesses Behavioral/Psych: Mood is stable, no new changes   All other systems were reviewed with the patient and are negative.   VITALS:  There were no vitals taken for this visit.  Wt Readings from Last 3 Encounters:  01/17/24 136 lb 6.4 oz (61.9 kg)  01/03/24 136 lb 11 oz (62 kg)  12/20/23 136 lb (61.7 kg)    There is no height or weight on file to calculate BMI.  Performance status (ECOG): 0 - Asymptomatic  PHYSICAL EXAM:   GENERAL:alert, no distress and comfortable SKIN: skin color, texture, turgor are normal, no rashes or significant lesions LUNGS: Diffuse bilateral wheezes present mainly in the upper lobes. HEART: regular rate & rhythm and no murmurs and pedal edema + right > left,no tenderness, 1+ ABDOMEN: No tenderness.  Normal bowel sounds.  No distention. Musculoskeletal:no cyanosis of digits and no clubbing  NEURO: alert & oriented x 3 with fluent speech   LABORATORY DATA:  I have reviewed the data as listed    Component Value Date/Time   NA 132 (L) 01/17/2024 0843   NA 140 06/05/2023 1157   K 4.0 01/17/2024 0843   CL 95 (L) 01/17/2024 0843   CO2 28 01/17/2024 0843   GLUCOSE 147 (H) 01/17/2024 0843   BUN 14 01/17/2024 0843   BUN 7 (L) 06/05/2023 1157   CREATININE 0.49 01/17/2024 0843   CALCIUM  9.0 01/17/2024 0843   PROT 6.7 01/17/2024 0843   PROT 6.2 06/05/2023 1157   ALBUMIN  4.4 01/17/2024 0843   ALBUMIN  4.3 06/05/2023 1157   AST 18 01/17/2024 0843   ALT 19 01/17/2024 0843   ALKPHOS 76 01/17/2024 0843   BILITOT 0.2 01/17/2024 0843   BILITOT 0.2 06/05/2023 1157   GFRNONAA >60 01/17/2024 0843   GFRAA >60  09/01/2019 1359    Lab Results  Component Value Date   WBC 8.9 01/17/2024   NEUTROABS 4.9 01/17/2024   HGB 12.0 01/17/2024   HCT 37.8 01/17/2024   MCV 84.9 01/17/2024   PLT 353 01/17/2024      Chemistry      Component Value Date/Time   NA 132 (L) 01/17/2024 0843   NA 140 06/05/2023 1157   K 4.0 01/17/2024 0843   CL 95 (L) 01/17/2024 0843   CO2 28 01/17/2024 0843   BUN 14 01/17/2024 0843  BUN 7 (L) 06/05/2023 1157   CREATININE 0.49 01/17/2024 0843      Component Value Date/Time   CALCIUM  9.0 01/17/2024 0843   ALKPHOS 76 01/17/2024 0843   AST 18 01/17/2024 0843   ALT 19 01/17/2024 0843   BILITOT 0.2 01/17/2024 0843   BILITOT 0.2 06/05/2023 1157      Latest Reference Range & Units 01/03/24 09:50  CA 19-9 0 - 35 U/mL 36 (H)  (H): Data is abnormally high  RADIOGRAPHIC STUDIES: I have personally reviewed the radiological images as listed and agreed with the findings in the report.

## 2024-01-17 NOTE — Progress Notes (Signed)
 Patient presents today for chemotherapy Abraxane/Gemzar  infusion. Patient is in satisfactory condition with no new complaints voiced.  Vital signs are stable.  Labs reviewed by Dr. Davonna during the office visit and all labs are within treatment parameters.  We will proceed with treatment per MD orders.   Treatment given today per MD orders. Tolerated infusion without adverse affects. Vital signs stable. No complaints at this time. Discharged from clinic ambulatory in stable condition. Alert and oriented x 3. F/U with [facility] as scheduled.

## 2024-01-17 NOTE — Patient Instructions (Signed)

## 2024-01-17 NOTE — Patient Instructions (Signed)
 [facility] [place] - A DEPT OF [facility]  Discharge Instructions: Thank you for choosing [facility] to provide your oncology and hematology care.  If you have a lab appointment with the Cancer Center - please note that after April 8th, 2024, all labs will be drawn in the cancer center.  You do not have to check in or register with the main entrance as you have in the past but will complete your check-in in the cancer center.  Wear comfortable clothing and clothing appropriate for easy access to any Portacath or PICC line.   We strive to give you quality time with your provider. You may need to reschedule your appointment if you arrive late (15 or more minutes).  Arriving late affects you and other patients whose appointments are after yours.  Also, if you miss three or more appointments without notifying the office, you may be dismissed from the clinic at the provider's discretion.      For prescription refill requests, have your pharmacy contact our office and allow 72 hours for refills to be completed.    Today you received the following chemotherapy and/or immunotherapy agents Abraxane and Gemzar     To help prevent nausea and vomiting after your treatment, we encourage you to take your nausea medication as directed.  BELOW ARE SYMPTOMS THAT SHOULD BE REPORTED IMMEDIATELY: *FEVER GREATER THAN 100.4 F (38 C) OR HIGHER *CHILLS OR SWEATING *NAUSEA AND VOMITING THAT IS NOT CONTROLLED WITH YOUR NAUSEA MEDICATION *UNUSUAL SHORTNESS OF BREATH *UNUSUAL BRUISING OR BLEEDING *URINARY PROBLEMS (pain or burning when urinating, or frequent urination) *BOWEL PROBLEMS (unusual diarrhea, constipation, pain near the anus) TENDERNESS IN MOUTH AND THROAT WITH OR WITHOUT PRESENCE OF ULCERS (sore throat, sores in mouth, or a toothache) UNUSUAL RASH, SWELLING OR PAIN  UNUSUAL VAGINAL DISCHARGE OR ITCHING   Items with * indicate a potential emergency and should be  followed up as soon as possible or go to the Emergency Department if any problems should occur.  Please show the CHEMOTHERAPY ALERT CARD or IMMUNOTHERAPY ALERT CARD at check-in to the Emergency Department and triage nurse.  Should you have questions after your visit or need to cancel or reschedule your appointment, please contact [facility] [place] - A DEPT OF Eligha [facility]ideg[facility] [phone]  and follow the prompts.  Office hours are 8:00 a.m. to 4:30 p.m. Monday - Friday. Please note that voicemails left after 4:00 p.m. may not be returned until the following business day.  We are closed weekends and major holidays. You have access to a nurse at all times for urgent questions. Please call the main number to the clinic [phone] and follow the prompts.  For any non-urgent questions, you may also contact your provider using MyChart. We now offer e-Visits for anyone 57 and older to request care online for non-urgent symptoms. For details visit [email or web address].   Also download the MyChart app! Go to the app store, search "MyChart", open the app, select [place], and log in with your MyChart username and password.

## 2024-01-23 ENCOUNTER — Encounter: Payer: Self-pay | Admitting: Oncology

## 2024-01-31 ENCOUNTER — Inpatient Hospital Stay

## 2024-02-04 ENCOUNTER — Inpatient Hospital Stay: Attending: Oncology | Admitting: Dietician

## 2024-02-04 ENCOUNTER — Telehealth: Payer: Self-pay | Admitting: Dietician

## 2024-02-04 DIAGNOSIS — R600 Localized edema: Secondary | ICD-10-CM | POA: Insufficient documentation

## 2024-02-04 DIAGNOSIS — F17210 Nicotine dependence, cigarettes, uncomplicated: Secondary | ICD-10-CM | POA: Insufficient documentation

## 2024-02-04 DIAGNOSIS — Z5111 Encounter for antineoplastic chemotherapy: Secondary | ICD-10-CM | POA: Insufficient documentation

## 2024-02-04 DIAGNOSIS — R519 Headache, unspecified: Secondary | ICD-10-CM | POA: Insufficient documentation

## 2024-02-04 DIAGNOSIS — Z9081 Acquired absence of spleen: Secondary | ICD-10-CM | POA: Insufficient documentation

## 2024-02-04 DIAGNOSIS — J4489 Other specified chronic obstructive pulmonary disease: Secondary | ICD-10-CM | POA: Insufficient documentation

## 2024-02-04 DIAGNOSIS — D6481 Anemia due to antineoplastic chemotherapy: Secondary | ICD-10-CM | POA: Insufficient documentation

## 2024-02-04 DIAGNOSIS — M25472 Effusion, left ankle: Secondary | ICD-10-CM | POA: Insufficient documentation

## 2024-02-04 DIAGNOSIS — T451X5A Adverse effect of antineoplastic and immunosuppressive drugs, initial encounter: Secondary | ICD-10-CM | POA: Insufficient documentation

## 2024-02-04 DIAGNOSIS — M25471 Effusion, right ankle: Secondary | ICD-10-CM | POA: Insufficient documentation

## 2024-02-04 DIAGNOSIS — Z791 Long term (current) use of non-steroidal anti-inflammatories (NSAID): Secondary | ICD-10-CM | POA: Insufficient documentation

## 2024-02-04 DIAGNOSIS — Z79899 Other long term (current) drug therapy: Secondary | ICD-10-CM | POA: Insufficient documentation

## 2024-02-04 DIAGNOSIS — K5900 Constipation, unspecified: Secondary | ICD-10-CM | POA: Insufficient documentation

## 2024-02-04 DIAGNOSIS — Z87440 Personal history of urinary (tract) infections: Secondary | ICD-10-CM | POA: Insufficient documentation

## 2024-02-04 DIAGNOSIS — C49A9 Gastrointestinal stromal tumor of other sites: Secondary | ICD-10-CM | POA: Insufficient documentation

## 2024-02-04 DIAGNOSIS — C251 Malignant neoplasm of body of pancreas: Secondary | ICD-10-CM | POA: Insufficient documentation

## 2024-02-04 NOTE — Telephone Encounter (Signed)
 Nutrition Follow-up:  Pt with stage II pancreatic adenocarcinoma. S/p partial pancreatectomy and splenectomy under the care of Dr. Dasie (8/25). Receiving adjuvant gemcitabine /abraxane  (start 10/9). Patient is under the care of Dr. Davonna Sermon with patient via telephone. Reports doing well overall. Appetite is good. Sometimes too good and causes stomach pain due to overeating. Asking if a cookie is okay to have on occasion. Patient drinking 2-3 bottles of water along with coffee, pepsi, and Gatorade. Patient taking one stool softener at bedtime for mild constipation. Bowels moving every 2 days. Patient reports she continues working to quit smoking. Currently smoking 3 cigarettes. Previously smoking 1.5 packs.   Medications: reviewed  Labs: Na 132, glucose 147  Anthropometrics: Wt 136 lb 6.4 oz on 11/20  11/6 - 136 lb 11 oz 10/9 - 138 lb 3.2 oz  9/29 - 133 lb 6.1 oz    NUTRITION DIAGNOSIS: Food and nutrition related knowledge deficit improving   INTERVENTION:  Educated on smaller more frequent meals/snacks for ease of digestion Recommend protein source at every meal Congratulated on decreased smoking - continue working for cessation      MONITORING, EVALUATION, GOAL: wt trends, intake   NEXT VISIT: Monday January 19 via telephone

## 2024-02-05 ENCOUNTER — Other Ambulatory Visit (HOSPITAL_COMMUNITY)

## 2024-02-05 ENCOUNTER — Other Ambulatory Visit: Payer: Self-pay

## 2024-02-14 ENCOUNTER — Inpatient Hospital Stay

## 2024-02-14 VITALS — BP 180/74 | HR 70 | Temp 97.8°F | Resp 16

## 2024-02-14 DIAGNOSIS — Z79899 Other long term (current) drug therapy: Secondary | ICD-10-CM | POA: Diagnosis not present

## 2024-02-14 DIAGNOSIS — Z9081 Acquired absence of spleen: Secondary | ICD-10-CM | POA: Diagnosis not present

## 2024-02-14 DIAGNOSIS — T451X5A Adverse effect of antineoplastic and immunosuppressive drugs, initial encounter: Secondary | ICD-10-CM | POA: Diagnosis not present

## 2024-02-14 DIAGNOSIS — K5900 Constipation, unspecified: Secondary | ICD-10-CM | POA: Diagnosis not present

## 2024-02-14 DIAGNOSIS — C259 Malignant neoplasm of pancreas, unspecified: Secondary | ICD-10-CM

## 2024-02-14 DIAGNOSIS — Z87440 Personal history of urinary (tract) infections: Secondary | ICD-10-CM | POA: Diagnosis not present

## 2024-02-14 DIAGNOSIS — J4489 Other specified chronic obstructive pulmonary disease: Secondary | ICD-10-CM | POA: Diagnosis not present

## 2024-02-14 DIAGNOSIS — M25471 Effusion, right ankle: Secondary | ICD-10-CM | POA: Diagnosis not present

## 2024-02-14 DIAGNOSIS — R519 Headache, unspecified: Secondary | ICD-10-CM | POA: Diagnosis not present

## 2024-02-14 DIAGNOSIS — D6481 Anemia due to antineoplastic chemotherapy: Secondary | ICD-10-CM | POA: Diagnosis not present

## 2024-02-14 DIAGNOSIS — Z5111 Encounter for antineoplastic chemotherapy: Secondary | ICD-10-CM | POA: Diagnosis present

## 2024-02-14 DIAGNOSIS — F17210 Nicotine dependence, cigarettes, uncomplicated: Secondary | ICD-10-CM | POA: Diagnosis not present

## 2024-02-14 DIAGNOSIS — C251 Malignant neoplasm of body of pancreas: Secondary | ICD-10-CM | POA: Diagnosis not present

## 2024-02-14 DIAGNOSIS — M25472 Effusion, left ankle: Secondary | ICD-10-CM | POA: Diagnosis not present

## 2024-02-14 DIAGNOSIS — R600 Localized edema: Secondary | ICD-10-CM | POA: Diagnosis not present

## 2024-02-14 DIAGNOSIS — Z791 Long term (current) use of non-steroidal anti-inflammatories (NSAID): Secondary | ICD-10-CM | POA: Diagnosis not present

## 2024-02-14 DIAGNOSIS — C49A9 Gastrointestinal stromal tumor of other sites: Secondary | ICD-10-CM | POA: Diagnosis not present

## 2024-02-14 LAB — CBC WITH DIFFERENTIAL/PLATELET
Abs Immature Granulocytes: 0.04 K/uL (ref 0.00–0.07)
Basophils Absolute: 0.2 K/uL — ABNORMAL HIGH (ref 0.0–0.1)
Basophils Relative: 1 %
Eosinophils Absolute: 0.2 K/uL (ref 0.0–0.5)
Eosinophils Relative: 2 %
HCT: 39.4 % (ref 36.0–46.0)
Hemoglobin: 12.6 g/dL (ref 12.0–15.0)
Immature Granulocytes: 0 %
Lymphocytes Relative: 23 %
Lymphs Abs: 2.8 K/uL (ref 0.7–4.0)
MCH: 28.4 pg (ref 26.0–34.0)
MCHC: 32 g/dL (ref 30.0–36.0)
MCV: 88.7 fL (ref 80.0–100.0)
Monocytes Absolute: 1.2 K/uL — ABNORMAL HIGH (ref 0.1–1.0)
Monocytes Relative: 10 %
Neutro Abs: 7.5 K/uL (ref 1.7–7.7)
Neutrophils Relative %: 64 %
Platelets: 493 K/uL — ABNORMAL HIGH (ref 150–400)
RBC: 4.44 MIL/uL (ref 3.87–5.11)
RDW: 23.1 % — ABNORMAL HIGH (ref 11.5–15.5)
Smear Review: NORMAL
WBC: 11.9 K/uL — ABNORMAL HIGH (ref 4.0–10.5)
nRBC: 0 % (ref 0.0–0.2)

## 2024-02-14 LAB — COMPREHENSIVE METABOLIC PANEL WITH GFR
ALT: 12 U/L (ref 0–44)
AST: 13 U/L — ABNORMAL LOW (ref 15–41)
Albumin: 4.2 g/dL (ref 3.5–5.0)
Alkaline Phosphatase: 91 U/L (ref 38–126)
Anion gap: 13 (ref 5–15)
BUN: 9 mg/dL (ref 8–23)
CO2: 25 mmol/L (ref 22–32)
Calcium: 8.9 mg/dL (ref 8.9–10.3)
Chloride: 97 mmol/L — ABNORMAL LOW (ref 98–111)
Creatinine, Ser: 0.59 mg/dL (ref 0.44–1.00)
GFR, Estimated: >60 mL/min (ref >60)
Glucose, Bld: 249 mg/dL — ABNORMAL HIGH (ref 70–99)
Potassium: 3.9 mmol/L (ref 3.5–5.1)
Sodium: 135 mmol/L (ref 135–145)
Total Bilirubin: 0.3 mg/dL (ref 0.0–1.2)
Total Protein: 6.7 g/dL (ref 6.5–8.1)

## 2024-02-14 LAB — MAGNESIUM: Magnesium: 2 mg/dL (ref 1.7–2.4)

## 2024-02-14 MED ORDER — SODIUM CHLORIDE 0.9 % IV SOLN: INTRAVENOUS | Status: DC

## 2024-02-14 MED ORDER — PACLITAXEL PROTEIN-BOUND CHEMO INJECTION 100 MG
125.0000 mg/m2 | Freq: Once | INTRAVENOUS | Status: AC
  Administered 2024-02-14: 13:00:00 200 mg via INTRAVENOUS
  Filled 2024-02-14: qty 40

## 2024-02-14 MED ORDER — PROCHLORPERAZINE MALEATE 10 MG PO TABS
10.0000 mg | ORAL_TABLET | Freq: Once | ORAL | Status: AC
  Administered 2024-02-14: 12:00:00 10 mg via ORAL
  Filled 2024-02-14: qty 1

## 2024-02-14 MED ORDER — SODIUM CHLORIDE 0.9 % IV SOLN
1000.0000 mg/m2 | Freq: Once | INTRAVENOUS | Status: AC
  Administered 2024-02-14: 14:00:00 1596 mg via INTRAVENOUS
  Filled 2024-02-14: qty 41.98

## 2024-02-14 NOTE — Patient Instructions (Signed)
 [facility] [place] - A DEPT OF [facility]  Discharge Instructions: Thank you for choosing [facility] to provide your oncology and hematology care.  If you have a lab appointment with the Cancer Center - please note that after April 8th, 2024, all labs will be drawn in the cancer center.  You do not have to check in or register with the main entrance as you have in the past but will complete your check-in in the cancer center.  Wear comfortable clothing and clothing appropriate for easy access to any Portacath or PICC line.   We strive to give you quality time with your provider. You may need to reschedule your appointment if you arrive late (15 or more minutes).  Arriving late affects you and other patients whose appointments are after yours.  Also, if you miss three or more appointments without notifying the office, you may be dismissed from the clinic at the providers discretion.      For prescription refill requests, have your pharmacy contact our office and allow 72 hours for refills to be completed.    Today you received the following chemotherapy and/or immunotherapy agents abraxane , gemcitabine     To help prevent nausea and vomiting after your treatment, we encourage you to take your nausea medication as directed.  BELOW ARE SYMPTOMS THAT SHOULD BE REPORTED IMMEDIATELY: *FEVER GREATER THAN 100.4 F (38 C) OR HIGHER *CHILLS OR SWEATING *NAUSEA AND VOMITING THAT IS NOT CONTROLLED WITH YOUR NAUSEA MEDICATION *UNUSUAL SHORTNESS OF BREATH *UNUSUAL BRUISING OR BLEEDING *URINARY PROBLEMS (pain or burning when urinating, or frequent urination) *BOWEL PROBLEMS (unusual diarrhea, constipation, pain near the anus) TENDERNESS IN MOUTH AND THROAT WITH OR WITHOUT PRESENCE OF ULCERS (sore throat, sores in mouth, or a toothache) UNUSUAL RASH, SWELLING OR PAIN  UNUSUAL VAGINAL DISCHARGE OR ITCHING   Items with * indicate a potential emergency and should be  followed up as soon as possible or go to the Emergency Department if any problems should occur.  Please show the CHEMOTHERAPY ALERT CARD or IMMUNOTHERAPY ALERT CARD at check-in to the Emergency Department and triage nurse.  Should you have questions after your visit or need to cancel or reschedule your appointment, please contact [facility] [place] - A DEPT OF [facility] [phone]  and follow the prompts.  Office hours are 8:00 a.m. to 4:30 p.m. Monday - Friday. Please note that voicemails left after 4:00 p.m. may not be returned until the following business day.  We are closed weekends and major holidays. You have access to a nurse at all times for urgent questions. Please call the main number to the clinic [phone] and follow the prompts.  For any non-urgent questions, you may also contact your provider using MyChart. We now offer e-Visits for anyone 67 and older to request care online for non-urgent symptoms. For details visit [email or web address].   Also download the MyChart app! Go to the app store, search MyChart, open the app, select [place], and log in with your MyChart username and password.

## 2024-02-14 NOTE — Progress Notes (Signed)
 Labs reviewed and ok to treat per parameters.   Treatment given per orders. Patient tolerated it well without problems. Vitals stable and discharged home from clinic via wheelchair. . Follow up as scheduled.

## 2024-02-15 LAB — CANCER ANTIGEN 19-9: CA 19-9: 21 U/mL (ref 0–35)

## 2024-02-15 LAB — CEA: CEA: 4.5 ng/mL (ref 0.0–4.7)

## 2024-02-25 ENCOUNTER — Encounter: Payer: Self-pay | Admitting: *Deleted

## 2024-02-27 ENCOUNTER — Inpatient Hospital Stay

## 2024-02-27 ENCOUNTER — Inpatient Hospital Stay (HOSPITAL_BASED_OUTPATIENT_CLINIC_OR_DEPARTMENT_OTHER): Admitting: Oncology

## 2024-02-27 VITALS — BP 180/70 | HR 71 | Temp 98.4°F | Resp 18

## 2024-02-27 VITALS — Ht 60.0 in | Wt 139.0 lb

## 2024-02-27 DIAGNOSIS — C259 Malignant neoplasm of pancreas, unspecified: Secondary | ICD-10-CM

## 2024-02-27 DIAGNOSIS — Z5111 Encounter for antineoplastic chemotherapy: Secondary | ICD-10-CM | POA: Diagnosis not present

## 2024-02-27 DIAGNOSIS — D649 Anemia, unspecified: Secondary | ICD-10-CM | POA: Diagnosis not present

## 2024-02-27 LAB — CBC WITH DIFFERENTIAL/PLATELET
Abs Immature Granulocytes: 0.03 K/uL (ref 0.00–0.07)
Basophils Absolute: 0.1 K/uL (ref 0.0–0.1)
Basophils Relative: 1 %
Eosinophils Absolute: 0.1 K/uL (ref 0.0–0.5)
Eosinophils Relative: 1 %
HCT: 35 % — ABNORMAL LOW (ref 36.0–46.0)
Hemoglobin: 11.5 g/dL — ABNORMAL LOW (ref 12.0–15.0)
Immature Granulocytes: 0 %
Lymphocytes Relative: 19 %
Lymphs Abs: 1.9 K/uL (ref 0.7–4.0)
MCH: 29.2 pg (ref 26.0–34.0)
MCHC: 32.9 g/dL (ref 30.0–36.0)
MCV: 88.8 fL (ref 80.0–100.0)
Monocytes Absolute: 1.7 K/uL — ABNORMAL HIGH (ref 0.1–1.0)
Monocytes Relative: 17 %
Neutro Abs: 6.1 K/uL (ref 1.7–7.7)
Neutrophils Relative %: 62 %
Platelets: 473 K/uL — ABNORMAL HIGH (ref 150–400)
RBC: 3.94 MIL/uL (ref 3.87–5.11)
RDW: 18.7 % — ABNORMAL HIGH (ref 11.5–15.5)
Smear Review: NORMAL
WBC: 9.8 K/uL (ref 4.0–10.5)
nRBC: 0 % (ref 0.0–0.2)

## 2024-02-27 LAB — COMPREHENSIVE METABOLIC PANEL WITH GFR
ALT: 16 U/L (ref 0–44)
AST: 16 U/L (ref 15–41)
Albumin: 3.9 g/dL (ref 3.5–5.0)
Alkaline Phosphatase: 131 U/L — ABNORMAL HIGH (ref 38–126)
Anion gap: 15 (ref 5–15)
BUN: 12 mg/dL (ref 8–23)
CO2: 23 mmol/L (ref 22–32)
Calcium: 8.6 mg/dL — ABNORMAL LOW (ref 8.9–10.3)
Chloride: 94 mmol/L — ABNORMAL LOW (ref 98–111)
Creatinine, Ser: 0.64 mg/dL (ref 0.44–1.00)
GFR, Estimated: >60 mL/min
Glucose, Bld: 212 mg/dL — ABNORMAL HIGH (ref 70–99)
Potassium: 3.5 mmol/L (ref 3.5–5.1)
Sodium: 131 mmol/L — ABNORMAL LOW (ref 135–145)
Total Bilirubin: 0.3 mg/dL (ref 0.0–1.2)
Total Protein: 6.6 g/dL (ref 6.5–8.1)

## 2024-02-27 LAB — MAGNESIUM: Magnesium: 2.2 mg/dL (ref 1.7–2.4)

## 2024-02-27 MED ORDER — INSULIN ASPART 100 UNIT/ML IJ SOLN: 10.0000 [IU] | Freq: Once | INTRAMUSCULAR | Status: DC

## 2024-02-27 MED ORDER — PROCHLORPERAZINE MALEATE 10 MG PO TABS
10.0000 mg | ORAL_TABLET | Freq: Once | ORAL | Status: AC
  Administered 2024-02-27: 10 mg via ORAL
  Filled 2024-02-27: qty 1

## 2024-02-27 MED ORDER — PACLITAXEL PROTEIN-BOUND CHEMO INJECTION 100 MG
125.0000 mg/m2 | Freq: Once | INTRAVENOUS | Status: AC
  Administered 2024-02-27: 200 mg via INTRAVENOUS
  Filled 2024-02-27: qty 40

## 2024-02-27 MED ORDER — SODIUM CHLORIDE 0.9 % IV SOLN
1000.0000 mg/m2 | Freq: Once | INTRAVENOUS | Status: AC
  Administered 2024-02-27: 1596 mg via INTRAVENOUS
  Filled 2024-02-27: qty 41.98

## 2024-02-27 MED ORDER — SODIUM CHLORIDE 0.9 % IV SOLN: INTRAVENOUS | Status: DC

## 2024-02-27 NOTE — Progress Notes (Signed)
 Patient's blood pressure elevated at discharge. Message sent to J.Burns NP. Message received may discharge patient home. F/U with PCP if blood pressure remains elevated. Patient asymptomatic.

## 2024-02-27 NOTE — Progress Notes (Unsigned)
 " Patient Care Team: Renato Dorothey HERO, NP as PCP - General (Internal Medicine) Davonna Siad, MD as Medical Oncologist (Medical Oncology) Celestia Joesph SQUIBB, RN as Oncology Nurse Navigator (Medical Oncology)  Clinic Day:  02/27/2024  Referring physician: Renato Dorothey HERO, NP   CHIEF COMPLAINT:  CC: Pancreatic adenocarcinoma    ASSESSMENT & PLAN:   Assessment & Plan: [name]  is a 73 y.o. 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  Pathology consistent with pancreatic adenocarcinoma, moderately differentiated, all margins negative, direct tumor extension into splenic vein near the confluence with the portal vein.  So size of the tumor could not be currently assessed because of chronic pancreatitis on the sample. 1 lymph node positive (1/11) Genetic Screening: Negative CA 19-9 normal   - Started on chemotherapy with gemcitabine  and Abraxane  on 12/06/2023.  Cycle 3 day 15 today.  Tolerated previous cycle well with 1 day of nausea. - Labs reviewed today: - Recent CT showed a soft tissue density in the operative bed that is suspicious for local recurrence.Discussed at TB and thought to be post surgical changes. - Will repeat after completing 3 cycles-scheduled 03/03/2024 - Plan for 6 months of chemotherapy.  Physical exam stable today.  Proceed with chemotherapy today. - Will obtain CA 19-9 with every cycle.  Significant decrease in CEA most recent from 02/14/2024 which was 4.5.  Return to clinic prior to fourth cycle and after CT scan to discuss with MD.  Anemia secondary to chemotherapy and recent surgery Anemia likely secondary to surgery and chemotherapy.  Hemoglobin has been relatively stable over the past month.  Hemoglobin 11.5 with elevated platelet counts at 473.  No gastrointestinal bleeding signs.  To  monitor. Edema of lower extremities Swelling in ankles likely due to fluid retention right > left.No pain or tenderness. Not on diuretics, elevates legs when lying down.  Constipation Constipation improved with stool softeners, not fully resolved.  - Continue Colace as needed.  Recurrent urinary tract infection Recurrent UTIs, resolved with antibiotics.  Poorly controlled COPD Asthma poorly controlled with wheezing. Inconsistent inhaler use and occasional smoking noted.  - Encouraged consistent use of inhalers and breathing treatments. - Advise cessation of smoking.  Current tobacco use Occasional tobacco use contributing to respiratory symptoms and poor asthma control.  - Advised cessation of smoking.  Gastrointestinal stromal tumor, malignant, colon Incidental diagnosis on omental nodule biopsy Low-grade and low risk with low mitotic rate. Caris test: Quality not sufficient   - Will observe at this time   Pneumococcal vaccination Patient underwent splenectomy and receiving vaccinations here.   Pneumococcal vaccine Prevnar 20 , Hib vaccine and  2 meningococcal vaccines first dose was given.   - Will administer PPSVV 23 every 5 years  - Meningococcal vaccine booster every 5 years - Yearly influenza vaccine   The patient understands the plans discussed today and is in agreement with them.  She knows to contact our office if she develops concerns prior to her next appointment.  I spent 20 minutes dedicated to the care of this patient (face-to-face and non-face-to-face) on the date of the encounter to include what is described in the assessment and plan.  Delon FORBES Hope, NP  [facility] [place] - A DEPT OF [facility] [address] [place] [address] Dept: [phone] Dept Fax: [phone]   No orders  of the defined types were placed in this encounter.    ONCOLOGY HISTORY:   Diagnosis: Pancreatic  adenocarcinoma:  -08/08/2023: CT CAP:2.1 cm hypoenhancing mass within the mid body of the pancreas resulting in mild dilation of the main pancreatic duct within the tail the pancreas and asymmetric pancreatic parenchymal atrophy within this region. No evidence regional adenopathy or distant metastatic disease within the abdomen and pelvis.  - 09/06/2023: EUS with biopsy:  -A round lesion was identified in the pancreatic body. The lesion was hypoechoic. The lesion measured 14 mm by 13 mm in maximal cross- sectional diameter. -Pancreatic body lesion, FNA: Adenocarcinoma - 09/17/2023: CA 19 9: 23-normal - 09/23/2023: CT chest:Scattered tiny pulmonary nodules measuring up to 2 mm, nonspecific but favored benign. No convincing evidence of metastatic disease in the chest.  -10/22/2023: Pancreatectomy and splenectomy.  -Pathology: Spleen and distal portion of pancreas showed moderately differentiated adenocarcinoma, at least 1.1 cm. Perineural invasion present. Metastatic adenocarcinoma in one of eleven lymph nodes (1/11). Pancreatic neck and duct margins were benign.  -Tumor size could not be determined, adenocarcinoma admixed with chronic pancreatitis - Involved by direct tumor extension to peripancreatic soft tissues-splenic vein near the confluence with the portal vein -Omentum nodule positive for calcified spindle cell lesion consistent with gastrointestinal stromal tumor, 2.2 cm. Tumor is diffusely positive for C-kit, while being negative for CK AE1/AE3.  - 11/26/2023: ATM, TMEM127-VUS, negative genetic testing. -11/29/2023: CT CAP: Interval splenectomy and resection of the pancreatic neck, body, and tail. Heterogeneous soft tissue density in the operative bed is suspicious for local recurrence, especially given elevated CEA on 11/26/2023. Combination of postoperative scarring and sequelae of subacute pancreatitis felt less likely. No evidence of metastatic disease.  - Discussed CT scan at [facility]. Thought  to be secondary to post surgical changes.    GIST:  -10/22/23: Omentum nodule biopsy: Omentum nodule positive for calcified spindle cell lesion consistent with gastrointestinal stromal tumor, 2.2 cm. Tumor is diffusely positive for C-kit, while being negative for CK AE1/AE3. Low mitotic rate.    Current Treatment: Plan for adjuvant Gemcitabine  + Abraxane   INTERVAL HISTORY:   [name] is a 73 y.o. female presenting to the clinic today for follow-up of pancreatic adenocarcinoma and GIST. Patient is accompanied by her son.  [name] experiences hair loss. She is tolerating chemotherapy well and denies any nausea or vomiting. She notes alternating diarrhea and constipation.  Cycle 3 postponed 2 weeks due to lack of transportation.  Her son went on vacation.  Clovis reports right lower extremity edema around the ankle and denies any pain to the area. She also notes pain in her left knee where she had an arthroscopy.   She denies any dysuria after being treated with antibiotics for a UTI.   Aaleeyah continues to smoke, though she states she is attempting to stop.   She reports an occasional headache.  Appetite is 100% and energy levels are 50%.  I have reviewed the past medical history, past surgical history, social history and family history with the patient and they are unchanged from previous note.  ALLERGIES:  is allergic to codeine and egg protein-containing drug products.  MEDICATIONS:  Current Outpatient Medications  Medication Sig Dispense Refill   acetaminophen  (TYLENOL ) 500 MG tablet Take 2 tablets (1,000 mg total) by mouth every 8 (eight) hours as needed for mild pain (pain score 1-3).     albuterol  (PROVENTIL  HFA;VENTOLIN  HFA) 108 (90 BASE) MCG/ACT inhaler Inhale 2 puffs into the lungs every 6 (  six) hours as needed for wheezing or shortness of breath.     albuterol  (PROVENTIL ) (2.5 MG/3ML) 0.083% nebulizer solution USE 1 VIAL IN NEBULIZER EVERY 6 HOURS AS NEEDED; Duration: 13      ALPRAZolam  (XANAX ) 0.5 MG tablet Take 0.5 mg by mouth 2 (two) times daily.     BREZTRI  AEROSPHERE 160-9-4.8 MCG/ACT AERO Inhale 2 puffs into the lungs 2 (two) times daily.     cariprazine  (VRAYLAR ) 1.5 MG capsule Take 1.5 mg by mouth daily.     cyanocobalamin  (VITAMIN B12) 1000 MCG tablet Take 1,000 mcg by mouth daily.     cyclobenzaprine (FLEXERIL) 10 MG tablet TAKE 1 TABLET BY MOUTH AT BEDTIME AS NEEDED FOR 30 DAYS; Duration: 30     diclofenac (VOLTAREN) 50 MG EC tablet Take 1 tablet by mouth twice daily; Duration: 90     escitalopram  (LEXAPRO ) 10 MG tablet Take 10 mg by mouth daily.     ferrous sulfate  325 (65 FE) MG tablet 1 tablet Orally Once a day; Duration: 90 days     furosemide  (LASIX ) 20 MG tablet Take 1 tablet (20 mg total) by mouth daily. 5 tablet 0   HYDROcodone -acetaminophen  (NORCO/VICODIN) 5-325 MG tablet 1 tablet as needed Orally every 8 hrs; Duration: 10 days     ipratropium-albuterol  (DUONEB) 0.5-2.5 (3) MG/3ML SOLN USE 1 AMPULE IN NEBULIZER 4 TIMES DAILY . APPOINTMENT REQUIRED FOR FUTURE REFILLS Inhalation every 6 hrs; Duration: 30 days     lidocaine -prilocaine  (EMLA ) cream Apply to affected area once 30 g 3   lisinopril -hydrochlorothiazide  (ZESTORETIC ) 20-12.5 MG tablet Take 1 tablet by mouth daily.     Methylcobalamin (B12) 5000 MCG SUBL as directed Sublingual     omeprazole  (PRILOSEC) 40 MG capsule Take 1 capsule (40 mg total) by mouth 2 (two) times daily before a meal. 60 capsule 6   ondansetron  (ZOFRAN ) 8 MG tablet Take 1 tablet (8 mg total) by mouth every 8 (eight) hours as needed for nausea or vomiting. 30 tablet 1   oxyCODONE  (OXY IR/ROXICODONE ) 5 MG immediate release tablet Take 1 tablet (5 mg total) by mouth every 6 (six) hours as needed for severe pain (pain score 7-10). 15 tablet 0   PHILLIPS STOOL SOFTENER 100 MG capsule Take 1 capsule by mouth twice daily 10 capsule 0   polyethylene glycol (MIRALAX ) 17 g packet Take 17 g by mouth daily. 30 each 11    prochlorperazine  (COMPAZINE ) 10 MG tablet Take 1 tablet (10 mg total) by mouth every 6 (six) hours as needed for nausea or vomiting. 30 tablet 1   No current facility-administered medications for this visit.   Facility-Administered Medications Ordered in Other Visits  Medication Dose Route Frequency Provider Last Rate Last Admin   0.9 %  sodium chloride  infusion (Manually program via Guardrails IV Fluids)  250 mL Intravenous Continuous Davonna Siad, MD   Stopped at 12/04/23 1145    REVIEW OF SYSTEMS:   Review of Systems  Constitutional:  Positive for malaise/fatigue.  HENT:  Positive for sore throat.   Cardiovascular:  Positive for leg swelling and PND.  Gastrointestinal:  Positive for nausea.  Genitourinary:  Positive for frequency and urgency.  Neurological:  Positive for headaches.      VITALS:  Height 5' (1.524 m), weight 139 lb (63 kg).  Wt Readings from Last 3 Encounters:  02/27/24 139 lb (63 kg)  02/14/24 137 lb 12.6 oz (62.5 kg)  01/17/24 136 lb 6.4 oz (61.9 kg)    Body mass  index is 27.15 kg/m.  Performance status (ECOG): 0 - Asymptomatic  PHYSICAL EXAM:   GENERAL:alert, no distress and comfortable SKIN: skin color, texture, turgor are normal, no rashes or significant lesions LUNGS: Diffuse bilateral wheezes present mainly in the upper lobes. HEART: regular rate & rhythm and no murmurs and pedal edema + right > left,no tenderness, 1+ ABDOMEN: No tenderness.  Normal bowel sounds.  No distention. Musculoskeletal:no cyanosis of digits and no clubbing  NEURO: alert & oriented x 3 with fluent speech   LABORATORY DATA:  I have reviewed the data as listed    Component Value Date/Time   NA 135 02/14/2024 0919   NA 140 06/05/2023 1157   K 3.9 02/14/2024 0919   CL 97 (L) 02/14/2024 0919   CO2 25 02/14/2024 0919   GLUCOSE 249 (H) 02/14/2024 0919   BUN 9 02/14/2024 0919   BUN 7 (L) 06/05/2023 1157   CREATININE 0.59 02/14/2024 0919   CALCIUM  8.9 02/14/2024  0919   PROT 6.7 02/14/2024 0919   PROT 6.2 06/05/2023 1157   ALBUMIN  4.2 02/14/2024 0919   ALBUMIN  4.3 06/05/2023 1157   AST 13 (L) 02/14/2024 0919   ALT 12 02/14/2024 0919   ALKPHOS 91 02/14/2024 0919   BILITOT 0.3 02/14/2024 0919   BILITOT 0.2 06/05/2023 1157   GFRNONAA >60 02/14/2024 0919   GFRAA >60 09/01/2019 1359    Lab Results  Component Value Date   WBC PENDING 02/27/2024   NEUTROABS PENDING 02/27/2024   HGB 11.5 (L) 02/27/2024   HCT 35.0 (L) 02/27/2024   MCV 88.8 02/27/2024   PLT 473 (H) 02/27/2024      Chemistry      Component Value Date/Time   NA 135 02/14/2024 0919   NA 140 06/05/2023 1157   K 3.9 02/14/2024 0919   CL 97 (L) 02/14/2024 0919   CO2 25 02/14/2024 0919   BUN 9 02/14/2024 0919   BUN 7 (L) 06/05/2023 1157   CREATININE 0.59 02/14/2024 0919      Component Value Date/Time   CALCIUM  8.9 02/14/2024 0919   ALKPHOS 91 02/14/2024 0919   AST 13 (L) 02/14/2024 0919   ALT 12 02/14/2024 0919   BILITOT 0.3 02/14/2024 0919   BILITOT 0.2 06/05/2023 1157      Latest Reference Range & Units 01/03/24 09:50  CA 19-9 0 - 35 U/mL 36 (H)  (H): Data is abnormally high  RADIOGRAPHIC STUDIES: I have personally reviewed the radiological images as listed and agreed with the findings in the report.   "

## 2024-02-27 NOTE — Patient Instructions (Signed)
 [facility] [place] - A DEPT OF [facility]  Discharge Instructions: Thank you for choosing [facility] to provide your oncology and hematology care.  If you have a lab appointment with the Cancer Center - please note that after April 8th, 2024, all labs will be drawn in the cancer center.  You do not have to check in or register with the main entrance as you have in the past but will complete your check-in in the cancer center.  Wear comfortable clothing and clothing appropriate for easy access to any Portacath or PICC line.   We strive to give you quality time with your provider. You may need to reschedule your appointment if you arrive late (15 or more minutes).  Arriving late affects you and other patients whose appointments are after yours.  Also, if you miss three or more appointments without notifying the office, you may be dismissed from the clinic at the provider's discretion.      For prescription refill requests, have your pharmacy contact our office and allow 72 hours for refills to be completed.    Today you received the following chemotherapy and/or immunotherapy agents Abraxane/Gemzar      To help prevent nausea and vomiting after your treatment, we encourage you to take your nausea medication as directed.  BELOW ARE SYMPTOMS THAT SHOULD BE REPORTED IMMEDIATELY: *FEVER GREATER THAN 100.4 F (38 C) OR HIGHER *CHILLS OR SWEATING *NAUSEA AND VOMITING THAT IS NOT CONTROLLED WITH YOUR NAUSEA MEDICATION *UNUSUAL SHORTNESS OF BREATH *UNUSUAL BRUISING OR BLEEDING *URINARY PROBLEMS (pain or burning when urinating, or frequent urination) *BOWEL PROBLEMS (unusual diarrhea, constipation, pain near the anus) TENDERNESS IN MOUTH AND THROAT WITH OR WITHOUT PRESENCE OF ULCERS (sore throat, sores in mouth, or a toothache) UNUSUAL RASH, SWELLING OR PAIN  UNUSUAL VAGINAL DISCHARGE OR ITCHING   Items with * indicate a potential emergency and should be  followed up as soon as possible or go to the Emergency Department if any problems should occur.  Please show the CHEMOTHERAPY ALERT CARD or IMMUNOTHERAPY ALERT CARD at check-in to the Emergency Department and triage nurse.  Should you have questions after your visit or need to cancel or reschedule your appointment, please contact [facility] [place] - A DEPT OF [facility] [phone]  and follow the prompts.  Office hours are 8:00 a.m. to 4:30 p.m. Monday - Friday. Please note that voicemails left after 4:00 p.m. may not be returned until the following business day.  We are closed weekends and major holidays. You have access to a nurse at all times for urgent questions. Please call the main number to the clinic [phone] and follow the prompts.  For any non-urgent questions, you may also contact your provider using MyChart. We now offer e-Visits for anyone 30 and older to request care online for non-urgent symptoms. For details visit [email or web address].   Also download the MyChart app! Go to the app store, search "MyChart", open the app, select [place], and log in with your MyChart username and password.

## 2024-02-27 NOTE — Progress Notes (Signed)
 Patient presents today for Abraxane /Gemzar  infusion per providers order.  Vital signs and labs within parameters for treatment.  Patient has no new complaints at this time.  Patient refusing standing order insulin , stating that she doesn't take insulin .  Treatment given today per MD orders.  Stable during infusion without adverse affects.  BP elevated, NP notified, per Delon Hope NP, patient okay to discharge home.  No complaints at this time.  Discharge from clinic via wheelchair in stable condition.  Alert and oriented X 3.  Follow up with [facility] as scheduled.

## 2024-02-28 ENCOUNTER — Encounter: Payer: Self-pay | Admitting: Oncology

## 2024-02-28 LAB — CEA: CEA: 3.8 ng/mL (ref 0.0–4.7)

## 2024-02-29 ENCOUNTER — Other Ambulatory Visit: Payer: Self-pay

## 2024-03-03 ENCOUNTER — Ambulatory Visit (HOSPITAL_COMMUNITY)
Admission: RE | Admit: 2024-03-03 | Discharge: 2024-03-03 | Disposition: A | Discharge status: Home / Self Care | Source: Ambulatory Visit | Attending: Oncology | Admitting: Oncology

## 2024-03-03 DIAGNOSIS — C259 Malignant neoplasm of pancreas, unspecified: Secondary | ICD-10-CM | POA: Insufficient documentation

## 2024-03-03 MED ORDER — IOHEXOL 300 MG/ML  SOLN
100.0000 mL | Freq: Once | INTRAMUSCULAR | Status: AC | PRN
  Administered 2024-03-03: 100 mL via INTRAVENOUS

## 2024-03-06 ENCOUNTER — Other Ambulatory Visit: Payer: Self-pay | Admitting: Oncology

## 2024-03-06 DIAGNOSIS — C259 Malignant neoplasm of pancreas, unspecified: Secondary | ICD-10-CM

## 2024-03-07 ENCOUNTER — Other Ambulatory Visit: Payer: Self-pay

## 2024-03-12 NOTE — Progress Notes (Signed)
 " Patient Care Team: Renato Dorothey HERO, NP as PCP - General (Internal Medicine) Davonna Siad, MD as Medical Oncologist (Medical Oncology) Celestia Joesph SQUIBB, RN as Oncology Nurse Navigator (Medical Oncology)  Clinic Day:  03/13/2024  Referring physician: Renato Dorothey HERO, NP   CHIEF COMPLAINT:  CC: Pancreatic adenocarcinoma    ASSESSMENT & PLAN:   Assessment & Plan: [name]  is a 74 y.o. 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  Pathology consistent with pancreatic adenocarcinoma, moderately differentiated, all margins negative, direct tumor extension into splenic vein near the confluence with the portal vein.  So size of the tumor could not be currently assessed because of chronic pancreatitis on the sample. 1 lymph node positive (1/11) Genetic Screening: Negative CA 19-9 normal S/P pancreatectomy and splenectomy and currently on adjuvant chemotherapy with gemcitabine  and abraxane    - We reviewed the CT scan findings together. No evidence of carcinoma and previous post surgical collection decreased in size.  - Cycle 4 day 1 today.  Tolerated previous cycles  - Labs reviewed today: CMP: Sodium: 132, creatinine:normal, normal LFTs.  CBC: WBC: normal, hemoglobin: 12.1, platelets: 624. -Will repeat CT scan after completing 6 cycles of chemotherapy - Physical exam stable today.  Proceed with chemotherapy today. - Will obtain CA 19-9 with every cycle. Previously normal. Pending from today.   Return to clinic prior to next cycle of chemotherapy for follow up  Thrombocytosis Likely reactive or iron  deficiency  -Will obtain iron  panel with next blood draw  Edema of lower extremities Swelling in ankles likely due to fluid retention right > left.No pain or tenderness. Not on diuretics, elevates legs when lying down.  Significantly improved.   Constipation Constipation improved with stool softeners, not fully resolved.  - Continue Colace as needed.  Poorly controlled COPD Asthma poorly controlled with wheezing. Inconsistent inhaler use and occasional smoking noted. improved  - Encouraged consistent use of inhalers and breathing treatments. - Advise cessation of smoking.  Current tobacco use Occasional tobacco use contributing to respiratory symptoms and poor asthma control.  - Advised cessation of smoking.  Gastrointestinal stromal tumor, malignant, colon Incidental diagnosis on omental nodule biopsy Low-grade and low risk with low mitotic rate. Caris test: Quality not sufficient   - Will observe at this time  Pneumococcal vaccination Patient underwent splenectomy and receiving vaccinations here.   Pneumococcal vaccine Prevnar 20 , Hib vaccine and  2 meningococcal vaccines first dose was given.   - Will administer PPSVV 23 every 5 years  - Meningococcal vaccine booster every 5 years - Yearly influenza vaccine   The patient understands the plans discussed today and is in agreement with them.  She knows to contact our office if she develops concerns prior to her next appointment.  The total time spent in the appointment was 24 minutes for the encounter  with patient, including review of chart and various tests results, discussions about plan of care and coordination of care plan   I,Helena R Teague,acting as a scribe for Siad Davonna, MD.,have documented all relevant documentation on the behalf of Siad Davonna, MD,as directed by  Siad Davonna, MD while in the presence of Siad Davonna, MD.  I, Siad Davonna MD, have reviewed the above documentation for accuracy and completeness, and I agree with the above.    Siad Davonna, MD  [facility] [place] - A DEPT  OF [facility] [address] [place] [address] Dept:  [phone] Dept Fax: [phone]   Orders Placed This Encounter  Procedures   Iron  and TIBC ([facility] ONLY)    Standing Status:   Future    Expected Date:   03/27/2024    Expiration Date:   06/25/2024   Ferritin    Standing Status:   Future    Expected Date:   03/27/2024    Expiration Date:   06/25/2024   Vitamin B12    Standing Status:   Future    Expected Date:   03/27/2024    Expiration Date:   06/25/2024   Folate    Standing Status:   Future    Expected Date:   03/27/2024    Expiration Date:   06/25/2024     ONCOLOGY HISTORY:   Diagnosis: Pancreatic adenocarcinoma:  -08/08/2023: CT CAP:2.1 cm hypoenhancing mass within the mid body of the pancreas resulting in mild dilation of the main pancreatic duct within the tail the pancreas and asymmetric pancreatic parenchymal atrophy within this region. No evidence regional adenopathy or distant metastatic disease within the abdomen and pelvis.  - 09/06/2023: EUS with biopsy:  -A round lesion was identified in the pancreatic body. The lesion was hypoechoic. The lesion measured 14 mm by 13 mm in maximal cross- sectional diameter. -Pancreatic body lesion, FNA: Adenocarcinoma - 09/17/2023: CA 19 9: 23-normal - 09/23/2023: CT chest:Scattered tiny pulmonary nodules measuring up to 2 mm, nonspecific but favored benign. No convincing evidence of metastatic disease in the chest.  -10/22/2023: Pancreatectomy and splenectomy.  -Pathology: Spleen and distal portion of pancreas showed moderately differentiated adenocarcinoma, at least 1.1 cm. Perineural invasion present. Metastatic adenocarcinoma in one of eleven lymph nodes (1/11). Pancreatic neck and duct margins were benign.  -Tumor size could not be determined, adenocarcinoma admixed with chronic pancreatitis - Involved by direct tumor extension to peripancreatic soft tissues-splenic vein near the confluence with the portal vein -Omentum nodule positive for calcified spindle cell  lesion consistent with gastrointestinal stromal tumor, 2.2 cm. Tumor is diffusely positive for C-kit, while being negative for CK AE1/AE3.  - 11/26/2023: ATM, TMEM127-VUS, negative genetic testing. -11/29/2023: CT CAP: Interval splenectomy and resection of the pancreatic neck, body, and tail. Heterogeneous soft tissue density in the operative bed is suspicious for local recurrence, especially given elevated CEA on 11/26/2023. Combination of postoperative scarring and sequelae of subacute pancreatitis felt less likely. No evidence of metastatic disease.  - Discussed CT scan at [facility]. Thought to be secondary to post surgical changes.  -03/03/2014: CT CAP: Status post partial pancreatectomy and splenectomy with interval decrease in soft tissue in the pancreatectomy bed and no enhancing nodularity.Interval contraction of the postsurgical collection beneath the left hemidiaphragm, now measuring 3.5 x 1.9 cm compared to 4.3 x 2.1 cm. No lymphadenopathy adjacent to the pancreatic head, periportal, or mesenteric regions.   GIST:  -10/22/23: Omentum nodule biopsy: Omentum nodule positive for calcified spindle cell lesion consistent with gastrointestinal stromal tumor, 2.2 cm. Tumor is diffusely positive for C-kit, while being negative for CK AE1/AE3. Low mitotic rate.    Current Treatment: Adjuvant Gemcitabine  + Abraxane   INTERVAL HISTORY:   [name] is a 74 y.o. female presenting to the clinic today for follow-up of pancreatic adenocarcinoma and GIST. Patient is accompanied by her son.  We discussed her CT results, which showed no evidence of disease. Her constipation comes and goes. She denies any diarrhea or vomiting. She has  not had any recurrent UTI's since being treated with antibiotics.   Patsy is attempting to stop smoking and has decreased the amount she smokes to 1 cigarette a day.   I have reviewed the past medical history, past surgical history, social history and family history with  the patient and they are unchanged from previous note.  ALLERGIES:  is allergic to codeine and egg protein-containing drug products.  MEDICATIONS:  Current Outpatient Medications  Medication Sig Dispense Refill   acetaminophen  (TYLENOL ) 500 MG tablet Take 2 tablets (1,000 mg total) by mouth every 8 (eight) hours as needed for mild pain (pain score 1-3).     albuterol  (PROVENTIL  HFA;VENTOLIN  HFA) 108 (90 BASE) MCG/ACT inhaler Inhale 2 puffs into the lungs every 6 (six) hours as needed for wheezing or shortness of breath.     albuterol  (PROVENTIL ) (2.5 MG/3ML) 0.083% nebulizer solution USE 1 VIAL IN NEBULIZER EVERY 6 HOURS AS NEEDED; Duration: 13     ALPRAZolam  (XANAX ) 0.5 MG tablet Take 0.5 mg by mouth 2 (two) times daily.     BREZTRI  AEROSPHERE 160-9-4.8 MCG/ACT AERO Inhale 2 puffs into the lungs 2 (two) times daily.     cariprazine  (VRAYLAR ) 1.5 MG capsule Take 1.5 mg by mouth daily.     cyanocobalamin  (VITAMIN B12) 1000 MCG tablet Take 1,000 mcg by mouth daily.     cyclobenzaprine (FLEXERIL) 10 MG tablet TAKE 1 TABLET BY MOUTH AT BEDTIME AS NEEDED FOR 30 DAYS; Duration: 30     diclofenac (VOLTAREN) 50 MG EC tablet Take 1 tablet by mouth twice daily; Duration: 90     escitalopram  (LEXAPRO ) 10 MG tablet Take 10 mg by mouth daily.     ferrous sulfate  325 (65 FE) MG tablet 1 tablet Orally Once a day; Duration: 90 days     furosemide  (LASIX ) 20 MG tablet Take 1 tablet (20 mg total) by mouth daily. 5 tablet 0   HYDROcodone -acetaminophen  (NORCO/VICODIN) 5-325 MG tablet 1 tablet as needed Orally every 8 hrs; Duration: 10 days     ipratropium-albuterol  (DUONEB) 0.5-2.5 (3) MG/3ML SOLN USE 1 AMPULE IN NEBULIZER 4 TIMES DAILY . APPOINTMENT REQUIRED FOR FUTURE REFILLS Inhalation every 6 hrs; Duration: 30 days     lidocaine -prilocaine  (EMLA ) cream Apply to affected area once 30 g 3   lisinopril -hydrochlorothiazide  (ZESTORETIC ) 20-12.5 MG tablet Take 1 tablet by mouth daily.     Methylcobalamin (B12) 5000  MCG SUBL as directed Sublingual     omeprazole  (PRILOSEC) 40 MG capsule Take 1 capsule (40 mg total) by mouth 2 (two) times daily before a meal. 60 capsule 6   ondansetron  (ZOFRAN ) 8 MG tablet Take 1 tablet (8 mg total) by mouth every 8 (eight) hours as needed for nausea or vomiting. 30 tablet 1   oxyCODONE  (OXY IR/ROXICODONE ) 5 MG immediate release tablet Take 1 tablet (5 mg total) by mouth every 6 (six) hours as needed for severe pain (pain score 7-10). 15 tablet 0   PHILLIPS STOOL SOFTENER 100 MG capsule Take 1 capsule by mouth twice daily 10 capsule 0   polyethylene glycol (MIRALAX ) 17 g packet Take 17 g by mouth daily. 30 each 11   prochlorperazine  (COMPAZINE ) 10 MG tablet Take 1 tablet (10 mg total) by mouth every 6 (six) hours as needed for nausea or vomiting. 30 tablet 1   No current facility-administered medications for this visit.   Facility-Administered Medications Ordered in Other Visits  Medication Dose Route Frequency Provider Last Rate Last Admin   0.9 %  sodium chloride  infusion (Manually program via Guardrails IV Fluids)  250 mL Intravenous Continuous Ezmae Speers, MD   Stopped at 12/04/23 1145   0.9 %  sodium chloride  infusion   Intravenous Continuous Crystalmarie Yasin, MD 10 mL/hr at 03/13/24 1023 New Bag at 03/13/24 1023   gemcitabine  (GEMZAR ) 1,596 mg in sodium chloride  0.9 % 250 mL chemo infusion  1,000 mg/m2 (Treatment Plan Recorded) Intravenous Once Antha Niday, MD 584 mL/hr at 03/13/24 1224 1,596 mg at 03/13/24 1224    VITALS:  There were no vitals taken for this visit.  Wt Readings from Last 3 Encounters:  03/13/24 136 lb 1.6 oz (61.7 kg)  02/27/24 139 lb (63 kg)  02/14/24 137 lb 12.6 oz (62.5 kg)    There is no height or weight on file to calculate BMI.  Performance status (ECOG): 0 - Asymptomatic  PHYSICAL EXAM:   GENERAL:alert, no distress and comfortable SKIN: skin color, texture, turgor are normal, no rashes or significant lesions LUNGS: Diffuse  bilateral wheezes present mainly in the upper lobes. HEART: regular rate & rhythm and no murmurs and pedal edema + right > left,no tenderness, 1+ ABDOMEN: No tenderness.  Normal bowel sounds.  No distention. Musculoskeletal:no cyanosis of digits and no clubbing  NEURO: alert & oriented x 3 with fluent speech   LABORATORY DATA:  I have reviewed the data as listed    Component Value Date/Time   NA 132 (L) 03/13/2024 0858   NA 140 06/05/2023 1157   K 3.8 03/13/2024 0858   CL 93 (L) 03/13/2024 0858   CO2 28 03/13/2024 0858   GLUCOSE 264 (H) 03/13/2024 0858   BUN 8 03/13/2024 0858   BUN 7 (L) 06/05/2023 1157   CREATININE 0.54 03/13/2024 0858   CALCIUM  8.7 (L) 03/13/2024 0858   PROT 6.4 (L) 03/13/2024 0858   PROT 6.2 06/05/2023 1157   ALBUMIN  4.3 03/13/2024 0858   ALBUMIN  4.3 06/05/2023 1157   AST 14 (L) 03/13/2024 0858   ALT 16 03/13/2024 0858   ALKPHOS 90 03/13/2024 0858   BILITOT 0.3 03/13/2024 0858   BILITOT 0.2 06/05/2023 1157   GFRNONAA >60 03/13/2024 0858   GFRAA >60 09/01/2019 1359    Lab Results  Component Value Date   WBC 10.3 03/13/2024   NEUTROABS 5.6 03/13/2024   HGB 12.1 03/13/2024   HCT 36.8 03/13/2024   MCV 89.1 03/13/2024   PLT 624 (H) 03/13/2024      Chemistry      Component Value Date/Time   NA 132 (L) 03/13/2024 0858   NA 140 06/05/2023 1157   K 3.8 03/13/2024 0858   CL 93 (L) 03/13/2024 0858   CO2 28 03/13/2024 0858   BUN 8 03/13/2024 0858   BUN 7 (L) 06/05/2023 1157   CREATININE 0.54 03/13/2024 0858      Component Value Date/Time   CALCIUM  8.7 (L) 03/13/2024 0858   ALKPHOS 90 03/13/2024 0858   AST 14 (L) 03/13/2024 0858   ALT 16 03/13/2024 0858   BILITOT 0.3 03/13/2024 0858   BILITOT 0.2 06/05/2023 1157      Latest Reference Range & Units 02/14/24 09:19 02/27/24 08:38  CA 19-9 0 - 35 U/mL 21   CEA 0.0 - 4.7 ng/mL 4.5 3.8    RADIOGRAPHIC STUDIES: I have personally reviewed the radiological images as listed and agreed with the  findings in the report.  CT CHEST ABDOMEN PELVIS W CONTRAST EXAM: CT CHEST, ABDOMEN AND PELVIS WITH CONTRAST 03/03/2024 03:32:23 PM  TECHNIQUE:  CT of the chest, abdomen and pelvis was performed with the administration of 100 mL of iohexol  (OMNIPAQUE ) 300 MG/ML solution. Multiplanar reformatted images are provided for review. Automated exposure control, iterative reconstruction, and/or weight based adjustment of the mA/kV was utilized to reduce the radiation dose to as low as reasonably achievable.  COMPARISON: CT Chest, Abdomen, and Pelvis dated 11/29/2023.  CLINICAL HISTORY: Pancreatic cancer, assess treatment response. Patient for f/u pancreatic cancer. Recent surgery to pancreas and spleen in August 2025. * Tracking Code: BO *  FINDINGS:  CHEST:  MEDIASTINUM AND LYMPH NODES: Heart and pericardium are unremarkable. The central airways are clear. No mediastinal, hilar or axillary lymphadenopathy. Chemotherapy port in the right chest wall. Catheter tip at the cavoatrial junction.  LUNGS AND PLEURA: No focal consolidation or pulmonary edema. No pleural effusion. No pneumothorax. No suspicious pulmonary nodules. No linear thickening of the right lung base consistent with benign linear atelectasis. Small focus of ground glass density in the peripheral right upper lobe on image 55 series 5 is favored infectious or inflammatory. No specific follow up recommended.  ABDOMEN AND PELVIS:  LIVER: Unremarkable.  GALLBLADDER AND BILE DUCTS: Unremarkable. No biliary ductal dilatation.  SPLEEN: Post splenectomy.  PANCREAS: Post partial pancreatectomy. Interval decrease in volume of soft tissue in the pancreatectomy bed compared to prior. No enhancing nodularity.  ADRENAL GLANDS: No acute abnormality.  KIDNEYS, URETERS AND BLADDER: No stones in the kidneys or ureters. No hydronephrosis. No perinephric or periureteral stranding. Urinary bladder is unremarkable.  GI AND  BOWEL: Stomach, duodenum, small bowel, and colon are normal. There is no bowel obstruction.  REPRODUCTIVE ORGANS: No acute abnormality.  PERITONEUM AND RETROPERITONEUM: Interval contraction of the postsurgical collection beneath the left hemidiaphragm which measures 3.5 x 1.9 cm on image 54 series 2 compared to 4.3 x 2.1 cm. No ascites. No free air. No omental nodularity or peritoneal nodularity.  VASCULATURE: Aorta is normal in caliber.  ABDOMINAL AND PELVIS LYMPH NODES: No lymphadenopathy adjacent to the pancreatic head. No periportal lymphadenopathy. No mesenteric lymphadenopathy.  BONES AND SOFT TISSUES: No acute osseous abnormality. No focal soft tissue abnormality.  IMPRESSION: 1. Status post partial pancreatectomy and splenectomy with interval decrease in soft tissue in the pancreatectomy bed and no enhancing nodularity. 2. Interval contraction of the postsurgical collection beneath the left hemidiaphragm, now measuring 3.5 x 1.9 cm compared to 4.3 x 2.1 cm. 3. No lymphadenopathy adjacent to the pancreatic head, periportal, or mesenteric regions.  Electronically signed by: Norleen Boxer MD 03/12/2024 12:14 PM EST RP Workstation: [id number]     "

## 2024-03-13 ENCOUNTER — Inpatient Hospital Stay: Attending: Oncology

## 2024-03-13 ENCOUNTER — Inpatient Hospital Stay (HOSPITAL_BASED_OUTPATIENT_CLINIC_OR_DEPARTMENT_OTHER): Admitting: Oncology

## 2024-03-13 ENCOUNTER — Inpatient Hospital Stay

## 2024-03-13 VITALS — BP 176/72 | HR 65 | Temp 97.6°F | Resp 18 | Wt 136.1 lb

## 2024-03-13 DIAGNOSIS — C251 Malignant neoplasm of body of pancreas: Secondary | ICD-10-CM | POA: Insufficient documentation

## 2024-03-13 DIAGNOSIS — R918 Other nonspecific abnormal finding of lung field: Secondary | ICD-10-CM | POA: Diagnosis not present

## 2024-03-13 DIAGNOSIS — C259 Malignant neoplasm of pancreas, unspecified: Secondary | ICD-10-CM

## 2024-03-13 DIAGNOSIS — C49A4 Gastrointestinal stromal tumor of large intestine: Secondary | ICD-10-CM | POA: Insufficient documentation

## 2024-03-13 DIAGNOSIS — D649 Anemia, unspecified: Secondary | ICD-10-CM

## 2024-03-13 DIAGNOSIS — Z791 Long term (current) use of non-steroidal anti-inflammatories (NSAID): Secondary | ICD-10-CM | POA: Diagnosis not present

## 2024-03-13 DIAGNOSIS — M25471 Effusion, right ankle: Secondary | ICD-10-CM | POA: Diagnosis not present

## 2024-03-13 DIAGNOSIS — Z5111 Encounter for antineoplastic chemotherapy: Secondary | ICD-10-CM | POA: Insufficient documentation

## 2024-03-13 DIAGNOSIS — F17210 Nicotine dependence, cigarettes, uncomplicated: Secondary | ICD-10-CM | POA: Diagnosis not present

## 2024-03-13 DIAGNOSIS — Z720 Tobacco use: Secondary | ICD-10-CM

## 2024-03-13 DIAGNOSIS — D75839 Thrombocytosis, unspecified: Secondary | ICD-10-CM | POA: Insufficient documentation

## 2024-03-13 DIAGNOSIS — M25472 Effusion, left ankle: Secondary | ICD-10-CM | POA: Insufficient documentation

## 2024-03-13 DIAGNOSIS — Z79899 Other long term (current) drug therapy: Secondary | ICD-10-CM | POA: Insufficient documentation

## 2024-03-13 DIAGNOSIS — K5900 Constipation, unspecified: Secondary | ICD-10-CM | POA: Insufficient documentation

## 2024-03-13 DIAGNOSIS — K861 Other chronic pancreatitis: Secondary | ICD-10-CM | POA: Diagnosis not present

## 2024-03-13 DIAGNOSIS — Z79633 Long term (current) use of mitotic inhibitor: Secondary | ICD-10-CM | POA: Insufficient documentation

## 2024-03-13 DIAGNOSIS — J449 Chronic obstructive pulmonary disease, unspecified: Secondary | ICD-10-CM | POA: Diagnosis not present

## 2024-03-13 DIAGNOSIS — R600 Localized edema: Secondary | ICD-10-CM | POA: Insufficient documentation

## 2024-03-13 DIAGNOSIS — Z9081 Acquired absence of spleen: Secondary | ICD-10-CM | POA: Insufficient documentation

## 2024-03-13 DIAGNOSIS — J4489 Other specified chronic obstructive pulmonary disease: Secondary | ICD-10-CM | POA: Diagnosis not present

## 2024-03-13 DIAGNOSIS — Z79631 Long term (current) use of antimetabolite agent: Secondary | ICD-10-CM | POA: Insufficient documentation

## 2024-03-13 LAB — CBC WITH DIFFERENTIAL/PLATELET
Abs Immature Granulocytes: 0.05 K/uL (ref 0.00–0.07)
Basophils Absolute: 0.1 K/uL (ref 0.0–0.1)
Basophils Relative: 1 %
Eosinophils Absolute: 0.1 K/uL (ref 0.0–0.5)
Eosinophils Relative: 1 %
HCT: 36.8 % (ref 36.0–46.0)
Hemoglobin: 12.1 g/dL (ref 12.0–15.0)
Immature Granulocytes: 1 %
Lymphocytes Relative: 27 %
Lymphs Abs: 2.7 K/uL (ref 0.7–4.0)
MCH: 29.3 pg (ref 26.0–34.0)
MCHC: 32.9 g/dL (ref 30.0–36.0)
MCV: 89.1 fL (ref 80.0–100.0)
Monocytes Absolute: 1.7 K/uL — ABNORMAL HIGH (ref 0.1–1.0)
Monocytes Relative: 16 %
Neutro Abs: 5.6 K/uL (ref 1.7–7.7)
Neutrophils Relative %: 54 %
Platelets: 624 K/uL — ABNORMAL HIGH (ref 150–400)
RBC: 4.13 MIL/uL (ref 3.87–5.11)
RDW: 17.2 % — ABNORMAL HIGH (ref 11.5–15.5)
WBC: 10.3 K/uL (ref 4.0–10.5)
nRBC: 0 % (ref 0.0–0.2)

## 2024-03-13 LAB — COMPREHENSIVE METABOLIC PANEL WITH GFR
ALT: 16 U/L (ref 0–44)
AST: 14 U/L — ABNORMAL LOW (ref 15–41)
Albumin: 4.3 g/dL (ref 3.5–5.0)
Alkaline Phosphatase: 90 U/L (ref 38–126)
Anion gap: 11 (ref 5–15)
BUN: 8 mg/dL (ref 8–23)
CO2: 28 mmol/L (ref 22–32)
Calcium: 8.7 mg/dL — ABNORMAL LOW (ref 8.9–10.3)
Chloride: 93 mmol/L — ABNORMAL LOW (ref 98–111)
Creatinine, Ser: 0.54 mg/dL (ref 0.44–1.00)
GFR, Estimated: >60 mL/min
Glucose, Bld: 264 mg/dL — ABNORMAL HIGH (ref 70–99)
Potassium: 3.8 mmol/L (ref 3.5–5.1)
Sodium: 132 mmol/L — ABNORMAL LOW (ref 135–145)
Total Bilirubin: 0.3 mg/dL (ref 0.0–1.2)
Total Protein: 6.4 g/dL — ABNORMAL LOW (ref 6.5–8.1)

## 2024-03-13 LAB — MAGNESIUM: Magnesium: 2 mg/dL (ref 1.7–2.4)

## 2024-03-13 MED ORDER — SODIUM CHLORIDE 0.9 % IV SOLN
1000.0000 mg/m2 | Freq: Once | INTRAVENOUS | Status: AC
  Administered 2024-03-13: 1596 mg via INTRAVENOUS
  Filled 2024-03-13: qty 41.98

## 2024-03-13 MED ORDER — PROCHLORPERAZINE MALEATE 10 MG PO TABS
10.0000 mg | ORAL_TABLET | Freq: Once | ORAL | Status: AC
  Administered 2024-03-13: 10 mg via ORAL
  Filled 2024-03-13: qty 1

## 2024-03-13 MED ORDER — PACLITAXEL PROTEIN-BOUND CHEMO INJECTION 100 MG
125.0000 mg/m2 | Freq: Once | INTRAVENOUS | Status: AC
  Administered 2024-03-13: 200 mg via INTRAVENOUS
  Filled 2024-03-13: qty 40

## 2024-03-13 MED ORDER — SODIUM CHLORIDE 0.9 % IV SOLN: INTRAVENOUS | Status: DC

## 2024-03-13 NOTE — Progress Notes (Signed)
 Patient has been examined by Dr. Davonna. Vital signs and labs have been reviewed by MD - ANC, Creatinine, LFTs, hemoglobin, and platelets have been reviewed by M.D. - pt may proceed with treatment.  Primary RN and pharmacy notified.

## 2024-03-13 NOTE — Patient Instructions (Signed)
 [facility] [place] - A DEPT OF [facility]  Discharge Instructions: Thank you for choosing [facility] to provide your oncology and hematology care.  If you have a lab appointment with the Cancer Center - please note that after April 8th, 2024, all labs will be drawn in the cancer center.  You do not have to check in or register with the main entrance as you have in the past but will complete your check-in in the cancer center.  Wear comfortable clothing and clothing appropriate for easy access to any Portacath or PICC line.   We strive to give you quality time with your provider. You may need to reschedule your appointment if you arrive late (15 or more minutes).  Arriving late affects you and other patients whose appointments are after yours.  Also, if you miss three or more appointments without notifying the office, you may be dismissed from the clinic at the providers discretion.      For prescription refill requests, have your pharmacy contact our office and allow 72 hours for refills to be completed.    Today you received the following chemotherapy and/or immunotherapy agents abraxane /gemzar       To help prevent nausea and vomiting after your treatment, we encourage you to take your nausea medication as directed.  BELOW ARE SYMPTOMS THAT SHOULD BE REPORTED IMMEDIATELY: *FEVER GREATER THAN 100.4 F (38 C) OR HIGHER *CHILLS OR SWEATING *NAUSEA AND VOMITING THAT IS NOT CONTROLLED WITH YOUR NAUSEA MEDICATION *UNUSUAL SHORTNESS OF BREATH *UNUSUAL BRUISING OR BLEEDING *URINARY PROBLEMS (pain or burning when urinating, or frequent urination) *BOWEL PROBLEMS (unusual diarrhea, constipation, pain near the anus) TENDERNESS IN MOUTH AND THROAT WITH OR WITHOUT PRESENCE OF ULCERS (sore throat, sores in mouth, or a toothache) UNUSUAL RASH, SWELLING OR PAIN  UNUSUAL VAGINAL DISCHARGE OR ITCHING   Items with * indicate a potential emergency and should be  followed up as soon as possible or go to the Emergency Department if any problems should occur.  Please show the CHEMOTHERAPY ALERT CARD or IMMUNOTHERAPY ALERT CARD at check-in to the Emergency Department and triage nurse.  Should you have questions after your visit or need to cancel or reschedule your appointment, please contact [facility] [place] - A DEPT OF [facility] [phone]  and follow the prompts.  Office hours are 8:00 a.m. to 4:30 p.m. Monday - Friday. Please note that voicemails left after 4:00 p.m. may not be returned until the following business day.  We are closed weekends and major holidays. You have access to a nurse at all times for urgent questions. Please call the main number to the clinic [phone] and follow the prompts.  For any non-urgent questions, you may also contact your provider using MyChart. We now offer e-Visits for anyone 58 and older to request care online for non-urgent symptoms. For details visit [email or web address].   Also download the MyChart app! Go to the app store, search MyChart, open the app, select , and log in with your MyChart username and password.

## 2024-03-13 NOTE — Patient Instructions (Addendum)
 [facility] at [facility] Discharge Instructions   You were seen and examined today by Dr. Davonna.  She reviewed the results of your lab work which are normal/stable.   She reviewed the results of your CT scan which did not show any evidence of cancer.   We will proceed with your treatment today.   Return as scheduled.    Thank you for choosing [facility] at [facility] to provide your oncology and hematology care.  To afford each patient quality time with our provider, please arrive at least 15 minutes before your scheduled appointment time.   If you have a lab appointment with the Cancer Center please come in thru the Main Entrance and check in at the main information desk.  You need to re-schedule your appointment should you arrive 10 or more minutes late.  We strive to give you quality time with our providers, and arriving late affects you and other patients whose appointments are after yours.  Also, if you no show three or more times for appointments you may be dismissed from the clinic at the providers discretion.     Again, thank you for choosing [facility].  Our hope is that these requests will decrease the amount of time that you wait before being seen by our physicians.       _____________________________________________________________  Should you have questions after your visit to [facility], please contact our office at [phone] and follow the prompts.  Our office hours are 8:00 a.m. and 4:30 p.m. Monday - Friday.  Please note that voicemails left after 4:00 p.m. may not be returned until the following business day.  We are closed weekends and major holidays.  You do have access to a nurse 24-7, just call the main number to the clinic [phone] and do not press any options, hold on the line and a nurse will answer the phone.    For prescription refill requests, have your pharmacy contact our  office and allow 72 hours.    Due to Covid, you will need to wear a mask upon entering the hospital. If you do not have a mask, a mask will be given to you at the Main Entrance upon arrival. For doctor visits, patients may have 1 support person age 29 or older with them. For treatment visits, patients can not have anyone with them due to social distancing guidelines and our immunocompromised population.

## 2024-03-13 NOTE — Progress Notes (Signed)
 Patient presents today for Abraxane /Gemzar  infusion per providers order.  Vital signs and labs reviewed by MD.  Per Dr. Davonna patient okay for treatment.    Treatment given today per MD orders.  Stable during infusion without adverse affects.  Vital signs stable.  No complaints at this time.  Discharge from clinic via wheelchair in stable condition.  Alert and oriented X 3.  Follow up with [facility] as scheduled.

## 2024-03-14 ENCOUNTER — Other Ambulatory Visit: Payer: Self-pay

## 2024-03-14 ENCOUNTER — Ambulatory Visit: Admitting: Urology

## 2024-03-14 LAB — CEA: CEA: 4.9 ng/mL — ABNORMAL HIGH (ref 0.0–4.7)

## 2024-03-14 LAB — CANCER ANTIGEN 19-9: CA 19-9: 21 U/mL (ref 0–35)

## 2024-03-17 ENCOUNTER — Telehealth: Payer: Self-pay | Admitting: Dietician

## 2024-03-17 ENCOUNTER — Inpatient Hospital Stay: Admitting: Dietician

## 2024-03-17 NOTE — Telephone Encounter (Signed)
 Nutrition Follow-up:  Pt with stage II pancreatic adenocarcinoma. S/p partial pancreatectomy and splenectomy under the care of Dr. Dasie (8/25). Receiving adjuvant gemcitabine /abraxane  (start 10/9). Patient is under the care of Dr. Davonna Sermon with patient via telephone. She is doing well. Reports good appetite. Eating small frequent meals and including good sources of protein. Had eggs, biscuit with sausage gravy for breakfast. Ate bacon cheeseburger for lunch and pintos, cornbread with glass of milk for supper last night. Patient is drinking 2-3 bottles of water and one cup of coffee. Patient has been exercising. Says she does 100 steps a day on her aerobics bench. Patient is down to one cigarette a day.   Medications: reviewed   Labs: 1/15 - Na 132, glucose 264  Anthropometrics: Wt 136 lb 1.6 oz on 1/15 - stable   12/18 - 137 lb 12.6 oz 11/20 - 136 lb 6.4 oz  10/23 - 136 lb    NUTRITION DIAGNOSIS: Food and nutrition related knowledge deficit improved    INTERVENTION:  Continue small frequent meals/snacks with adequate calories and protein    MONITORING, EVALUATION, GOAL: wt trends, intake   NEXT VISIT: Monday February 16 via telephone

## 2024-03-18 ENCOUNTER — Other Ambulatory Visit: Payer: Self-pay

## 2024-03-20 ENCOUNTER — Inpatient Hospital Stay

## 2024-03-27 ENCOUNTER — Inpatient Hospital Stay

## 2024-03-27 VITALS — BP 178/88 | HR 66 | Temp 97.8°F | Resp 18

## 2024-03-27 DIAGNOSIS — D649 Anemia, unspecified: Secondary | ICD-10-CM

## 2024-03-27 DIAGNOSIS — Z5111 Encounter for antineoplastic chemotherapy: Secondary | ICD-10-CM | POA: Diagnosis not present

## 2024-03-27 DIAGNOSIS — C259 Malignant neoplasm of pancreas, unspecified: Secondary | ICD-10-CM

## 2024-03-27 LAB — CBC WITH DIFFERENTIAL/PLATELET
Abs Immature Granulocytes: 0.02 10*3/uL (ref 0.00–0.07)
Basophils Absolute: 0.2 10*3/uL — ABNORMAL HIGH (ref 0.0–0.1)
Basophils Relative: 2 %
Eosinophils Absolute: 0.4 10*3/uL (ref 0.0–0.5)
Eosinophils Relative: 4 %
HCT: 38.1 % (ref 36.0–46.0)
Hemoglobin: 12.4 g/dL (ref 12.0–15.0)
Immature Granulocytes: 0 %
Lymphocytes Relative: 28 %
Lymphs Abs: 2.6 10*3/uL (ref 0.7–4.0)
MCH: 29.7 pg (ref 26.0–34.0)
MCHC: 32.5 g/dL (ref 30.0–36.0)
MCV: 91.4 fL (ref 80.0–100.0)
Monocytes Absolute: 1.2 10*3/uL — ABNORMAL HIGH (ref 0.1–1.0)
Monocytes Relative: 13 %
Neutro Abs: 5 10*3/uL (ref 1.7–7.7)
Neutrophils Relative %: 53 %
Platelets: 337 10*3/uL (ref 150–400)
RBC: 4.17 MIL/uL (ref 3.87–5.11)
RDW: 16.5 % — ABNORMAL HIGH (ref 11.5–15.5)
WBC: 9.4 10*3/uL (ref 4.0–10.5)
nRBC: 0 % (ref 0.0–0.2)

## 2024-03-27 LAB — COMPREHENSIVE METABOLIC PANEL WITH GFR
ALT: 19 U/L (ref 0–44)
AST: 18 U/L (ref 15–41)
Albumin: 4.3 g/dL (ref 3.5–5.0)
Alkaline Phosphatase: 83 U/L (ref 38–126)
Anion gap: 13 (ref 5–15)
BUN: 9 mg/dL (ref 8–23)
CO2: 25 mmol/L (ref 22–32)
Calcium: 9.2 mg/dL (ref 8.9–10.3)
Chloride: 97 mmol/L — ABNORMAL LOW (ref 98–111)
Creatinine, Ser: 0.54 mg/dL (ref 0.44–1.00)
GFR, Estimated: >60 mL/min
Glucose, Bld: 167 mg/dL — ABNORMAL HIGH (ref 70–99)
Potassium: 4.3 mmol/L (ref 3.5–5.1)
Sodium: 135 mmol/L (ref 135–145)
Total Bilirubin: 0.3 mg/dL (ref 0.0–1.2)
Total Protein: 6.6 g/dL (ref 6.5–8.1)

## 2024-03-27 LAB — IRON AND TIBC
Iron: 66 ug/dL (ref 28–170)
Saturation Ratios: 20 % (ref 10.4–31.8)
TIBC: 325 ug/dL (ref 250–450)
UIBC: 259 ug/dL

## 2024-03-27 LAB — MAGNESIUM: Magnesium: 2 mg/dL (ref 1.7–2.4)

## 2024-03-27 LAB — VITAMIN B12: Vitamin B-12: 1773 pg/mL — ABNORMAL HIGH (ref 180–914)

## 2024-03-27 LAB — FOLATE: Folate: 15.2 ng/mL

## 2024-03-27 LAB — FERRITIN: Ferritin: 82 ng/mL (ref 11–307)

## 2024-03-27 MED ORDER — PROCHLORPERAZINE MALEATE 10 MG PO TABS
10.0000 mg | ORAL_TABLET | Freq: Once | ORAL | Status: AC
  Administered 2024-03-27: 10 mg via ORAL
  Filled 2024-03-27: qty 1

## 2024-03-27 MED ORDER — SODIUM CHLORIDE 0.9 % IV SOLN: INTRAVENOUS | Status: DC

## 2024-03-27 MED ORDER — SODIUM CHLORIDE 0.9 % IV SOLN
1000.0000 mg/m2 | Freq: Once | INTRAVENOUS | Status: AC
  Administered 2024-03-27: 1596 mg via INTRAVENOUS
  Filled 2024-03-27: qty 41.98

## 2024-03-27 MED ORDER — PACLITAXEL PROTEIN-BOUND CHEMO INJECTION 100 MG
125.0000 mg/m2 | Freq: Once | INTRAVENOUS | Status: AC
  Administered 2024-03-27: 200 mg via INTRAVENOUS
  Filled 2024-03-27: qty 40

## 2024-03-27 NOTE — Progress Notes (Signed)

## 2024-03-27 NOTE — Patient Instructions (Signed)
 [facility] [place] - A DEPT OF [facility]  Discharge Instructions: Thank you for choosing [facility] to provide your oncology and hematology care.  If you have a lab appointment with the Cancer Center - please note that after April 8th, 2024, all labs will be drawn in the cancer center.  You do not have to check in or register with the main entrance as you have in the past but will complete your check-in in the cancer center.  Wear comfortable clothing and clothing appropriate for easy access to any Portacath or PICC line.   We strive to give you quality time with your provider. You may need to reschedule your appointment if you arrive late (15 or more minutes).  Arriving late affects you and other patients whose appointments are after yours.  Also, if you miss three or more appointments without notifying the office, you may be dismissed from the clinic at the provider's discretion.      For prescription refill requests, have your pharmacy contact our office and allow 72 hours for refills to be completed.    Today you received the following chemotherapy and/or immunotherapy agents Abraxane and Gemzar     To help prevent nausea and vomiting after your treatment, we encourage you to take your nausea medication as directed.  BELOW ARE SYMPTOMS THAT SHOULD BE REPORTED IMMEDIATELY: *FEVER GREATER THAN 100.4 F (38 C) OR HIGHER *CHILLS OR SWEATING *NAUSEA AND VOMITING THAT IS NOT CONTROLLED WITH YOUR NAUSEA MEDICATION *UNUSUAL SHORTNESS OF BREATH *UNUSUAL BRUISING OR BLEEDING *URINARY PROBLEMS (pain or burning when urinating, or frequent urination) *BOWEL PROBLEMS (unusual diarrhea, constipation, pain near the anus) TENDERNESS IN MOUTH AND THROAT WITH OR WITHOUT PRESENCE OF ULCERS (sore throat, sores in mouth, or a toothache) UNUSUAL RASH, SWELLING OR PAIN  UNUSUAL VAGINAL DISCHARGE OR ITCHING   Items with * indicate a potential emergency and should be  followed up as soon as possible or go to the Emergency Department if any problems should occur.  Please show the CHEMOTHERAPY ALERT CARD or IMMUNOTHERAPY ALERT CARD at check-in to the Emergency Department and triage nurse.  Should you have questions after your visit or need to cancel or reschedule your appointment, please contact [facility] [place] - A DEPT OF Eligha [facility]ideg[facility] [phone]  and follow the prompts.  Office hours are 8:00 a.m. to 4:30 p.m. Monday - Friday. Please note that voicemails left after 4:00 p.m. may not be returned until the following business day.  We are closed weekends and major holidays. You have access to a nurse at all times for urgent questions. Please call the main number to the clinic [phone] and follow the prompts.  For any non-urgent questions, you may also contact your provider using MyChart. We now offer e-Visits for anyone 57 and older to request care online for non-urgent symptoms. For details visit [email or web address].   Also download the MyChart app! Go to the app store, search "MyChart", open the app, select [place], and log in with your MyChart username and password.

## 2024-03-28 LAB — CEA: CEA: 5.2 ng/mL — ABNORMAL HIGH (ref 0.0–4.7)

## 2024-04-03 ENCOUNTER — Other Ambulatory Visit: Payer: Self-pay | Admitting: Oncology

## 2024-04-03 DIAGNOSIS — C259 Malignant neoplasm of pancreas, unspecified: Secondary | ICD-10-CM

## 2024-04-10 ENCOUNTER — Inpatient Hospital Stay

## 2024-04-10 ENCOUNTER — Inpatient Hospital Stay: Admitting: Oncology

## 2024-04-14 ENCOUNTER — Inpatient Hospital Stay: Admitting: Dietician

## 2024-04-24 ENCOUNTER — Inpatient Hospital Stay

## 2024-05-08 ENCOUNTER — Inpatient Hospital Stay: Admitting: Oncology

## 2024-05-08 ENCOUNTER — Inpatient Hospital Stay
